# Patient Record
Sex: Male | Born: 1965
Health system: Southern US, Community
[De-identification: ages and names within clinical notes are randomized; demographics above are authoritative.]

## PROBLEM LIST (undated history)

## (undated) DIAGNOSIS — G459 Transient cerebral ischemic attack, unspecified: Secondary | ICD-10-CM

## (undated) DIAGNOSIS — W3400XA Accidental discharge from unspecified firearms or gun, initial encounter: Secondary | ICD-10-CM

## (undated) DIAGNOSIS — J449 Chronic obstructive pulmonary disease, unspecified: Secondary | ICD-10-CM

## (undated) DIAGNOSIS — K219 Gastro-esophageal reflux disease without esophagitis: Secondary | ICD-10-CM

## (undated) DIAGNOSIS — M24521 Contracture, right elbow: Secondary | ICD-10-CM

## (undated) DIAGNOSIS — I10 Essential (primary) hypertension: Secondary | ICD-10-CM

## (undated) DIAGNOSIS — M6281 Muscle weakness (generalized): Secondary | ICD-10-CM

## (undated) DIAGNOSIS — Z5900 Homelessness unspecified: Secondary | ICD-10-CM

## (undated) DIAGNOSIS — R262 Difficulty in walking, not elsewhere classified: Secondary | ICD-10-CM

## (undated) DIAGNOSIS — I890 Lymphedema, not elsewhere classified: Secondary | ICD-10-CM

## (undated) DIAGNOSIS — N529 Male erectile dysfunction, unspecified: Secondary | ICD-10-CM

## (undated) DIAGNOSIS — M7500 Adhesive capsulitis of unspecified shoulder: Secondary | ICD-10-CM

## (undated) DIAGNOSIS — N393 Stress incontinence (female) (male): Secondary | ICD-10-CM

## (undated) DIAGNOSIS — G47419 Narcolepsy without cataplexy: Secondary | ICD-10-CM

## (undated) DIAGNOSIS — R531 Weakness: Secondary | ICD-10-CM

## (undated) DIAGNOSIS — I959 Hypotension, unspecified: Secondary | ICD-10-CM

## (undated) DIAGNOSIS — M2450 Contracture, unspecified joint: Secondary | ICD-10-CM

## (undated) DIAGNOSIS — R296 Repeated falls: Secondary | ICD-10-CM

## (undated) DIAGNOSIS — R4182 Altered mental status, unspecified: Secondary | ICD-10-CM

## (undated) DIAGNOSIS — J4489 Other specified chronic obstructive pulmonary disease: Secondary | ICD-10-CM

## (undated) DIAGNOSIS — G8191 Hemiplegia, unspecified affecting right dominant side: Secondary | ICD-10-CM

## (undated) DIAGNOSIS — J189 Pneumonia, unspecified organism: Secondary | ICD-10-CM

## (undated) DIAGNOSIS — L210 Seborrhea capitis: Secondary | ICD-10-CM

## (undated) DIAGNOSIS — G479 Sleep disorder, unspecified: Secondary | ICD-10-CM

## (undated) DIAGNOSIS — E785 Hyperlipidemia, unspecified: Secondary | ICD-10-CM

## (undated) DIAGNOSIS — Z9114 Patient's other noncompliance with medication regimen: Secondary | ICD-10-CM

## (undated) HISTORY — DX: Transient cerebral ischemic attack, unspecified: G45.9

## (undated) HISTORY — DX: Hyperlipidemia, unspecified: E78.5

## (undated) HISTORY — DX: Chronic obstructive pulmonary disease, unspecified: J44.9

## (undated) HISTORY — DX: Muscle weakness (generalized): M62.81

## (undated) HISTORY — DX: Contracture, unspecified joint: M24.50

## (undated) HISTORY — DX: Stress incontinence (female) (male): N39.3

## (undated) HISTORY — DX: Hemiplegia, unspecified affecting right dominant side: G81.91

## (undated) HISTORY — DX: Weakness: R53.1

## (undated) HISTORY — DX: Difficulty in walking, not elsewhere classified: R26.2

## (undated) HISTORY — DX: Repeated falls: R29.6

## (undated) HISTORY — DX: Male erectile dysfunction, unspecified: N52.9

## (undated) HISTORY — DX: Other specified chronic obstructive pulmonary disease: J44.89

## (undated) HISTORY — DX: Gastro-esophageal reflux disease without esophagitis: K21.9

## (undated) HISTORY — DX: Contracture, right elbow: M24.521

## (undated) HISTORY — PX: LEG SURGERY: SHX1003

## (undated) HISTORY — DX: Narcolepsy without cataplexy: G47.419

## (undated) HISTORY — DX: Adhesive capsulitis of unspecified shoulder: M75.00

## (undated) HISTORY — DX: Sleep disorder, unspecified: G47.9

---

## 1997-11-30 ENCOUNTER — Emergency Department (HOSPITAL_COMMUNITY): Admission: EM | Admit: 1997-11-30 | Discharge: 1997-11-30 | Payer: Self-pay | Admitting: Emergency Medicine

## 1998-02-20 ENCOUNTER — Other Ambulatory Visit: Admission: RE | Admit: 1998-02-20 | Discharge: 1998-02-20 | Payer: Self-pay | Admitting: Gastroenterology

## 2001-05-14 ENCOUNTER — Inpatient Hospital Stay (HOSPITAL_COMMUNITY): Admission: EM | Admit: 2001-05-14 | Discharge: 2001-05-15 | Payer: Self-pay | Admitting: Emergency Medicine

## 2001-05-14 ENCOUNTER — Encounter: Payer: Self-pay | Admitting: Emergency Medicine

## 2001-07-11 DIAGNOSIS — J189 Pneumonia, unspecified organism: Secondary | ICD-10-CM

## 2001-07-11 HISTORY — DX: Pneumonia, unspecified organism: J18.9

## 2006-06-02 ENCOUNTER — Emergency Department (HOSPITAL_COMMUNITY): Admission: EM | Admit: 2006-06-02 | Discharge: 2006-06-02 | Payer: Self-pay | Admitting: Emergency Medicine

## 2006-07-08 ENCOUNTER — Ambulatory Visit: Payer: Self-pay | Admitting: Internal Medicine

## 2007-07-16 ENCOUNTER — Ambulatory Visit: Payer: Self-pay | Admitting: Internal Medicine

## 2007-07-16 DIAGNOSIS — I10 Essential (primary) hypertension: Secondary | ICD-10-CM

## 2007-07-16 LAB — CONVERTED CEMR LAB
Bilirubin Urine: NEGATIVE
Blood in Urine, dipstick: NEGATIVE
Glucose, Urine, Semiquant: NEGATIVE
Specific Gravity, Urine: >=1.03
pH: 5

## 2007-07-18 LAB — CONVERTED CEMR LAB
Chloride: 107 meq/L (ref 96–112)
Creatinine, Ser: 1.33 mg/dL (ref 0.40–1.50)
Potassium: 4.4 meq/L (ref 3.5–5.3)
Sodium: 143 meq/L (ref 135–145)

## 2007-07-20 ENCOUNTER — Encounter (INDEPENDENT_AMBULATORY_CARE_PROVIDER_SITE_OTHER): Payer: Self-pay | Admitting: *Deleted

## 2007-08-03 ENCOUNTER — Ambulatory Visit: Payer: Self-pay | Admitting: Internal Medicine

## 2007-10-21 ENCOUNTER — Emergency Department (HOSPITAL_COMMUNITY): Admission: EM | Admit: 2007-10-21 | Discharge: 2007-10-21 | Payer: Self-pay | Admitting: Emergency Medicine

## 2008-03-24 ENCOUNTER — Encounter (INDEPENDENT_AMBULATORY_CARE_PROVIDER_SITE_OTHER): Payer: Self-pay | Admitting: Internal Medicine

## 2008-10-27 ENCOUNTER — Ambulatory Visit: Payer: Self-pay | Admitting: Internal Medicine

## 2008-10-27 DIAGNOSIS — R609 Edema, unspecified: Secondary | ICD-10-CM

## 2008-10-27 LAB — CONVERTED CEMR LAB
BUN: 15 mg/dL (ref 6–23)
CO2: 25 meq/L (ref 19–32)
Chloride: 104 meq/L (ref 96–112)
Glucose, Bld: 94 mg/dL (ref 70–99)
Potassium: 5.3 meq/L (ref 3.5–5.3)

## 2008-11-03 ENCOUNTER — Ambulatory Visit: Payer: Self-pay | Admitting: *Deleted

## 2008-11-09 ENCOUNTER — Telehealth (INDEPENDENT_AMBULATORY_CARE_PROVIDER_SITE_OTHER): Payer: Self-pay | Admitting: Internal Medicine

## 2008-12-08 ENCOUNTER — Ambulatory Visit: Payer: Self-pay | Admitting: Internal Medicine

## 2008-12-08 DIAGNOSIS — K219 Gastro-esophageal reflux disease without esophagitis: Secondary | ICD-10-CM

## 2008-12-08 DIAGNOSIS — N529 Male erectile dysfunction, unspecified: Secondary | ICD-10-CM

## 2008-12-08 HISTORY — DX: Gastro-esophageal reflux disease without esophagitis: K21.9

## 2008-12-08 HISTORY — DX: Male erectile dysfunction, unspecified: N52.9

## 2008-12-23 ENCOUNTER — Ambulatory Visit: Payer: Self-pay | Admitting: Internal Medicine

## 2008-12-30 ENCOUNTER — Ambulatory Visit: Payer: Self-pay | Admitting: Internal Medicine

## 2009-01-05 ENCOUNTER — Emergency Department (HOSPITAL_COMMUNITY): Admission: EM | Admit: 2009-01-05 | Discharge: 2009-01-06 | Payer: Self-pay | Admitting: Emergency Medicine

## 2009-01-17 ENCOUNTER — Encounter (INDEPENDENT_AMBULATORY_CARE_PROVIDER_SITE_OTHER): Payer: Self-pay | Admitting: Internal Medicine

## 2009-01-17 DIAGNOSIS — E785 Hyperlipidemia, unspecified: Secondary | ICD-10-CM

## 2009-01-17 LAB — CONVERTED CEMR LAB
LDL Cholesterol: 110 mg/dL — ABNORMAL HIGH (ref 0–99)
Triglycerides: 215 mg/dL — ABNORMAL HIGH (ref <150)
VLDL: 43 mg/dL — ABNORMAL HIGH (ref 0–40)

## 2009-02-01 ENCOUNTER — Encounter (INDEPENDENT_AMBULATORY_CARE_PROVIDER_SITE_OTHER): Payer: Self-pay | Admitting: Internal Medicine

## 2009-02-16 ENCOUNTER — Ambulatory Visit: Payer: Self-pay | Admitting: Internal Medicine

## 2009-02-23 ENCOUNTER — Encounter (INDEPENDENT_AMBULATORY_CARE_PROVIDER_SITE_OTHER): Payer: Self-pay | Admitting: Internal Medicine

## 2009-02-24 ENCOUNTER — Ambulatory Visit: Payer: Self-pay | Admitting: Internal Medicine

## 2009-02-24 DIAGNOSIS — G479 Sleep disorder, unspecified: Secondary | ICD-10-CM | POA: Insufficient documentation

## 2009-02-24 HISTORY — DX: Sleep disorder, unspecified: G47.9

## 2009-02-24 LAB — CONVERTED CEMR LAB
ALT: 24 units/L (ref 0–53)
AST: 20 units/L (ref 0–37)
Albumin: 4.2 g/dL (ref 3.5–5.2)
Calcium: 9.4 mg/dL (ref 8.4–10.5)
Chloride: 103 meq/L (ref 96–112)
HCT: 42.8 % (ref 39.0–52.0)
Ketones, urine, test strip: NEGATIVE
Lymphocytes Relative: 29 % (ref 12–46)
Lymphs Abs: 2.2 10*3/uL (ref 0.7–4.0)
Neutrophils Relative %: 60 % (ref 43–77)
Nitrite: NEGATIVE
Platelets: 321 10*3/uL (ref 150–400)
Potassium: 4.3 meq/L (ref 3.5–5.3)
WBC: 7.5 10*3/uL (ref 4.0–10.5)
pH: 5

## 2009-02-25 ENCOUNTER — Ambulatory Visit (HOSPITAL_COMMUNITY): Admission: RE | Admit: 2009-02-25 | Discharge: 2009-02-25 | Payer: Self-pay | Admitting: Internal Medicine

## 2009-02-27 ENCOUNTER — Encounter (INDEPENDENT_AMBULATORY_CARE_PROVIDER_SITE_OTHER): Payer: Self-pay | Admitting: Internal Medicine

## 2009-03-10 ENCOUNTER — Telehealth (INDEPENDENT_AMBULATORY_CARE_PROVIDER_SITE_OTHER): Payer: Self-pay | Admitting: Internal Medicine

## 2009-04-14 ENCOUNTER — Encounter (INDEPENDENT_AMBULATORY_CARE_PROVIDER_SITE_OTHER): Payer: Self-pay | Admitting: Internal Medicine

## 2009-04-17 ENCOUNTER — Encounter (INDEPENDENT_AMBULATORY_CARE_PROVIDER_SITE_OTHER): Payer: Self-pay | Admitting: Internal Medicine

## 2009-04-24 ENCOUNTER — Encounter: Admission: RE | Admit: 2009-04-24 | Discharge: 2009-04-24 | Payer: Self-pay | Admitting: Internal Medicine

## 2009-05-09 ENCOUNTER — Encounter (INDEPENDENT_AMBULATORY_CARE_PROVIDER_SITE_OTHER): Payer: Self-pay | Admitting: Internal Medicine

## 2009-05-15 ENCOUNTER — Encounter (INDEPENDENT_AMBULATORY_CARE_PROVIDER_SITE_OTHER): Payer: Self-pay | Admitting: Internal Medicine

## 2009-05-15 ENCOUNTER — Ambulatory Visit (HOSPITAL_BASED_OUTPATIENT_CLINIC_OR_DEPARTMENT_OTHER): Admission: RE | Admit: 2009-05-15 | Discharge: 2009-05-15 | Payer: Self-pay | Admitting: Internal Medicine

## 2009-05-20 ENCOUNTER — Ambulatory Visit: Payer: Self-pay | Admitting: Internal Medicine

## 2009-06-08 ENCOUNTER — Ambulatory Visit: Payer: Self-pay | Admitting: Internal Medicine

## 2009-06-08 DIAGNOSIS — G4733 Obstructive sleep apnea (adult) (pediatric): Secondary | ICD-10-CM

## 2009-06-30 ENCOUNTER — Encounter (INDEPENDENT_AMBULATORY_CARE_PROVIDER_SITE_OTHER): Payer: Self-pay | Admitting: Internal Medicine

## 2009-07-19 ENCOUNTER — Telehealth (INDEPENDENT_AMBULATORY_CARE_PROVIDER_SITE_OTHER): Payer: Self-pay | Admitting: Internal Medicine

## 2009-07-22 ENCOUNTER — Encounter (INDEPENDENT_AMBULATORY_CARE_PROVIDER_SITE_OTHER): Payer: Self-pay | Admitting: Internal Medicine

## 2009-07-27 ENCOUNTER — Encounter (INDEPENDENT_AMBULATORY_CARE_PROVIDER_SITE_OTHER): Payer: Self-pay | Admitting: *Deleted

## 2009-08-20 ENCOUNTER — Telehealth (INDEPENDENT_AMBULATORY_CARE_PROVIDER_SITE_OTHER): Payer: Self-pay | Admitting: Internal Medicine

## 2009-09-20 ENCOUNTER — Telehealth (INDEPENDENT_AMBULATORY_CARE_PROVIDER_SITE_OTHER): Payer: Self-pay | Admitting: Internal Medicine

## 2009-09-22 ENCOUNTER — Telehealth (INDEPENDENT_AMBULATORY_CARE_PROVIDER_SITE_OTHER): Payer: Self-pay | Admitting: Internal Medicine

## 2009-11-22 ENCOUNTER — Encounter (INDEPENDENT_AMBULATORY_CARE_PROVIDER_SITE_OTHER): Payer: Self-pay | Admitting: *Deleted

## 2009-11-29 ENCOUNTER — Ambulatory Visit (HOSPITAL_BASED_OUTPATIENT_CLINIC_OR_DEPARTMENT_OTHER): Admission: RE | Admit: 2009-11-29 | Discharge: 2009-11-29 | Payer: Self-pay | Admitting: Internal Medicine

## 2009-11-29 ENCOUNTER — Encounter (INDEPENDENT_AMBULATORY_CARE_PROVIDER_SITE_OTHER): Payer: Self-pay | Admitting: Internal Medicine

## 2009-12-02 ENCOUNTER — Ambulatory Visit: Payer: Self-pay | Admitting: Internal Medicine

## 2009-12-25 ENCOUNTER — Telehealth (INDEPENDENT_AMBULATORY_CARE_PROVIDER_SITE_OTHER): Payer: Self-pay | Admitting: Internal Medicine

## 2010-06-12 NOTE — Letter (Signed)
Summary: [facility] [facility]  [place] [place] [facility]   Imported By: Eilleen Kempf 08/24/2009 15:10:33  _____________________________________________________________________  External Attachment:    Type:   Image     Comment:   External Document

## 2010-06-12 NOTE — Letter (Signed)
Summary: *HSN Results Follow up  [facility]  [address] [place], [place] [address]   Phone: [phone]  Fax: [phone]      07/27/2009   [name] [address] [place], [place]  [address]   Dear  [name],                            ____S.Drinkard,FNP   ____D. Gore,FNP       ____B. McPherson,MD   ____V. Rankins,MD    __x__E. Mulberry,MD    ____N. Daphine Deutscher, FNP  ____D. Reche Dixon, MD    ____K. Philipp Deputy, MD    ____Other     This letter is to inform you that your recent test(s):  _______Pap Smear    _______Lab Test     _______X-ray    _______ is within acceptable limits  _______ requires a medication change  _______ requires a follow-up lab visit  _______ requires a follow-up visit with your provider   Comments:  We have tried to reach you several times.  Please give our office a call regarding your letter for school.  Thank you.       _________________________________________________________ If you have any questions, please contact our office                     Sincerely,  Vesta Mixer CMA [facility]

## 2010-06-12 NOTE — Letter (Signed)
Summary: FAXED REQUESTED RECORDS TO [facility]  FAXED REQUESTED RECORDS TO [facility]   Imported By: Arta Bruce 06/02/2009 14:45:24  _____________________________________________________________________  External Attachment:    Type:   Image     Comment:   External Document

## 2010-06-12 NOTE — Progress Notes (Signed)
Summary: MEDS AND PROPERTY STOLLEN  Phone Note Call from Patient Call back at [phone]   Summary of Call: [name] PT. [name] CALLED AND SAYS THAT HE IS STAYING AT THE [facility], AND HIS BELONGINGS WERE TAKING INCLUDING ALL HIS MEDICATIONS, AND HE IS ASKING IF YOU CAN CALL EVERYTHING INTO [facility]. AND IF POSSIBLE IF HE CAN GET HIS MEDS BEFORE 4 DAYS. Initial call taken by: Leodis Rains,  Sep 22, 2009 8:53 AM  Follow-up for Phone Call        Spoke with Burna Mortimer at [facility], they will refill meds early for pt. Follow-up by: Vesta Mixer CMA,  Sep 25, 2009 11:04 AM  Additional Follow-up for Phone Call Additional follow up Details #1::        Left msg for pt at [facility] house. [phone] Additional Follow-up by: Vesta Mixer CMA,  Sep 25, 2009 11:06 AM

## 2010-06-12 NOTE — Progress Notes (Signed)
  Phone Note Outgoing Call   Summary of Call: Called pt. as appears he never was set up for CPAP titration--he states he lost phone service last month or so and that's why we could not get hold of him.  Letter that went out did not specify need to set up titration.  His address and phone numbers are current (currently).  Debra--could you please set him up for CPAP titration--he already had the positive sleep study in January, they just did not have enough time to do the titration.  Order in chart.  Thanks Initial call taken by: Julieanne Manson MD,  August 20, 2009 4:41 PM

## 2010-06-12 NOTE — Letter (Signed)
Summary: Generic Letter  [facility]  [address] [place], [place] [address]   Phone: [phone]  Fax: [phone]    07/22/2009  Re:  KENSINGTON DUERST      [address]      [place], [place]  [address]  To Whom It May Concern:  I am Mr. Santillo's primary health provider and attest that he does carry the diagnosis of obstructive sleep apnea.  We are currently working on getting him treatment for this disorder.  He may have difficulties staying awake during the day because of his condition until his treatment starts.          Sincerely,   Julieanne Manson MD

## 2010-06-12 NOTE — Assessment & Plan Note (Signed)
Summary: OFFICE VIISIT/GK   Vital Signs:  Patient profile:   45 year old male Weight:      246.9 pounds Temp:     98 degrees F oral Pulse rate:   99 / minute Pulse rhythm:   regular Resp:     20 per minute BP sitting:   160 / 103  (left arm) Cuff size:   regular  Vitals Entered By: Mikey College CMA (June 08, 2009 2:12 PM)  Serial Vital Signs/Assessments:  Time      Position  BP       Pulse  Resp  Temp     By 2:23 PM             120/82                         Mikey College CMA                     146/108                        Julieanne Manson MD  Comments: 2:23 PM MANUAL BP CHECK By: Mikey College CMA   CC: PT HERE BP CHECK AND STATES THAT MEDS THAT 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 PT DID REQUEST TO HAVE RECORDS SENT TO PROVIDER Is Patient Diabetic? No Pain Assessment Patient in pain? no       Does patient need assistance? 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 PT DID REQUEST TO HAVE RECORDS SENT TO PROVIDER.  History of Present Illness: 1.  OSA:  Moderate.  Unable to complete the CPAP titration as did not sleep long enough.  Recommendation to follow up with Sleep lab if no improvemnt with CPAP as his daytime somnolence problems seem more pronounced than what his OSA would suggest.  Is living with a friend currently.  2.  Hypertension:  Pt. states in jail during month of December, he was taking Lopressor 100 mg two times a day, baby aspirin and HCTZ.  BP was well controlled then and swelling was much better.  No problems with those meds.  Pt. increased his Toprol dosing to 200 mg daily on his own.  Pt. owed 175 dollars from cost of court for 2007.  3.  Peripheral edema:  Not too bad currently.  Once no longer homeless and sleeping upright--helped.  Restarted Furosemide once out of  jail.  Allergies: No Known Drug Allergies  Physical Exam  General:  Obese, NAD Lungs:  Normal respiratory effort, chest expands symmetrically. Lungs are clear to auscultation, no crackles or wheezes. Heart:  Recheck of BP was 146/108.Marland Kitchen RRR without abnormal sounds.  Radial pulses normal and equal Extremities:  trace edema.  Obese legs   Impression & Recommendations:  Problem # 1:  OBSTRUCTIVE SLEEP APNEA (ICD-327.23)  Orders: Sleep Disorder Referral (Sleep Disorder)--CPAP titration If daytime sleepiness does not improve with this, then will send back for further evaluation  Problem # 2:  HYPERTENSION (ICD-401.9) Increase Toprol to 300 mg total daily His updated medication list for this problem includes:    Toprol Xl 100 Mg Xr24h-tab (Metoprolol succinate) .Marland Kitchen... 1 tab by mouth in morning and 2 tabs at night    Hydrochlorothiazide 25 Mg Tabs (Hydrochlorothiazide) .Marland KitchenMarland KitchenMarland KitchenMarland Kitchen 1  tab by mouth every morning    Furosemide 20 Mg Tabs (Furosemide) .Marland Kitchen... 1 tab by mouth daily  Problem # 3:  PERIPHERAL EDEMA (ICD-782.3) Much better--stop Furosemide and KCl His updated medication list for this problem includes:    Hydrochlorothiazide 25 Mg Tabs (Hydrochlorothiazide) .Marland Kitchen... 1 tab by mouth every morning    Furosemide 20 Mg Tabs (Furosemide) .Marland Kitchen... 1 tab by mouth daily  Complete Medication List: 1)  Toprol Xl 100 Mg Xr24h-tab (Metoprolol succinate) .Marland Kitchen.. 1 tab by mouth in morning and 2 tabs at night 2)  Hydrochlorothiazide 25 Mg Tabs (Hydrochlorothiazide) .Marland Kitchen.. 1 tab by mouth every morning 3)  Famotidine 40 Mg Tabs (Famotidine) .Marland Kitchen.. 1 tab by mouth q hs 4)  Furosemide 20 Mg Tabs (Furosemide) .Marland Kitchen.. 1 tab by mouth daily 5)  Klor-con M10 10 Meq Cr-tabs (Potassium chloride crys cr) .Marland Kitchen.. 1 tab by mouth daily  Patient Instructions: 1)  Nurse visit for bp and pulse check in 2 weeks. 2)  Follow up with Dr. Delrae Alfred in 4months --OSA, htn Prescriptions: FAMOTIDINE 40 MG TABS (FAMOTIDINE) 1 tab by mouth q hs   #30 x 11   Entered and Authorized by:   Julieanne Manson MD   Signed by:   Julieanne Manson MD on 06/08/2009   Method used:   Faxed to ...       [facility] (retail)       [address] [place], [place]  [address]       Ph: [phone]       Fax: [phone]   RxID:   [id number] HYDROCHLOROTHIAZIDE 25 MG TABS (HYDROCHLOROTHIAZIDE) 1 tab by mouth every morning  #30 x 11   Entered and Authorized by:   Julieanne Manson MD   Signed by:   Julieanne Manson MD on 06/08/2009   Method used:   Faxed to ...       [facility] (retail)       [address] [place], [place]  [address]       Ph: [phone]       Fax: [phone]   RxID:   [id number] TOPROL XL 100 MG XR24H-TAB (METOPROLOL SUCCINATE) 1 tab by mouth in morning and 2 tabs at night  #90 x 11   Entered and Authorized by:   Julieanne Manson MD   Signed by:   Julieanne Manson MD on 06/08/2009   Method used:   Faxed to ...       [facility] (retail)       [address] [place], [place]  [address]       Ph: [phone]       Fax: [phone]   RxID:   [id number]

## 2010-06-12 NOTE — Progress Notes (Signed)
Summary: [facility] LETTER FOR SLEEP APNEIA  Phone Note Call from Patient Call back at Home Phone [phone]   Summary of Call: Divante Kotch PT. MR Droge CALLED AND SAYS THAT HE NEEDS A LETTER STATING THAT HE HAS SLEEP APNEIA. 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 Initial call taken by: Leodis Rains,  July 19, 2009 10:20 AM  Follow-up for Phone Call        Will check to see if she can do this. Follow-up by: Vesta Mixer CMA,  July 19, 2009 10:56 AM  Additional Follow-up for Phone Call Additional follow up Details #1::        written--when is he to have the CPAP titration?  Has it already been done? Additional Follow-up by: Julieanne Manson MD,  July 22, 2009 3:01 PM    Additional Follow-up for Phone Call Additional follow up Details #2::    Left message on answering machine for pt to return call at [phone]. Follow-up by: Vesta Mixer CMA,  July 24, 2009 11:50 AM  Additional Follow-up for Phone Call Additional follow up Details #3:: Details for Additional Follow-up Action Taken: Left message on answering machine for pt to return call at same number......... Elmarie Shiley McCoy CMA  July 26, 2009 9:57 AM   Left message on answering machine for pt to return call and letter mailed to pt since 3rd attempt to reach pt...Marland KitchenMarland KitchenMarland Kitchen Elmarie Shiley McCoy CMA  July 27, 2009 11:48 AM

## 2010-06-12 NOTE — Progress Notes (Signed)
Summary: Sleep study referral  Phone Note From Other Clinic   Caller: Referral Coordinator Summary of Call: Pt noshowed for sleep study.Pt so R/S Initial call taken by: Candi Leash,  Sep 20, 2009 11:56 AM

## 2010-06-12 NOTE — Letter (Signed)
Summary: MAILED REQUESTED RECORDS TO [facility]  MAILED REQUESTED RECORDS TO [facility]   Imported By: Arta Bruce 06/30/2009 15:21:42  _____________________________________________________________________  External Attachment:    Type:   Image     Comment:   External Document

## 2010-06-12 NOTE — Letter (Signed)
Summary: *HSN Results Follow up  [facility]  [address] [place], [place] [address]   Phone: [phone]  Fax: [phone]      11/22/2009   ORYON GARY [address] [place], [place]  [address]   Dear  Mr. Jaaziah Liebler,                            ____S.Drinkard,FNP   ____D. Gore,FNP       ____B. McPherson,MD   ____V. Rankins,MD    ____E. Mulberry,MD    ____N. Daphine Deutscher, FNP  ____D. Reche Dixon, MD    ____K. Philipp Deputy, MD    ____Other     This letter is to inform you that your recent test(s):  _______Pap Smear    _______Lab Test     _______X-ray    _______ is within acceptable limits  _______ requires a medication change  _______ requires a follow-up lab visit  _______ requires a follow-up visit with your provider   Comments: OUR OFFICE IS BEING TRYING TO CALL YOU ABOUT A SLEEP STUDY REFERRAL .PLEASE CONTACT [place] [phone] TO MAKE A NEW APPT AT [facility] TIME .THANK YOU AND HAVE A NICE DAY         _________________________________________________________ If you have any questions, please contact our office                     Sincerely,  Cheryll Dessert [facility]

## 2010-06-12 NOTE — Progress Notes (Signed)
Summary: [facility] referral for home CPAP set up  Phone Note From Other Clinic   Summary of Call: Sleep Clinic:  successful titration performed.  Did have periodic limb movement--to follow this and see if improves with CPAP usage or consider meds if not.    Arna Medici:  needs to be set up through [facility] respiratory to get CPAP--see orders. Initial call taken by: Julieanne Manson MD,  December 25, 2009 1:51 PM  Follow-up for Phone Call        I FAXED THE REFERRAL TO [facility] AND ALSO THE [facility] APPLICATION (MAILED TO ) [facility]  IS AN APPLICATION FOR PT THAT ARE SELF PAY BEFORE THEY DO THE PROCEDURE   THE PT NEED TO MAILED THE FORM  Follow-up by: Cheryll Dessert,  December 27, 2009 12:48 PM

## 2010-06-12 NOTE — Letter (Signed)
Summary: *HSN Results Follow up  [facility]  [address] [place], [place] [address]   Phone: [phone]  Fax: [phone]      11/22/2009   NATHANIE OTTLEY [address] [place], [place]  [address]   Dear  Mr. Brogan Sisler,                            ____S.Drinkard,FNP   ____D. Gore,FNP       ____B. McPherson,MD   ____V. Rankins,MD    _X___E. Mulberry,MD    ____N. Daphine Deutscher, FNP  ____D. Reche Dixon, MD    ____K. Philipp Deputy, MD    ____Other     This letter is to inform you that your recent test(s):  _______Pap Smear    _______Lab Test     _______X-ray    _______ is within acceptable limits  _______ requires a medication change  _______ requires a follow-up lab visit  _______ requires a follow-up visit with your provider   Comments:  OUR OFFICE IS BEING TRYING TO CALL YOU ABOUT A REFERRAL (SLEEP STUDY) .PLEASE, CONTACT [place] [phone] TO MAKE A NEW APPT AT YOUR CONVINIENCE TIME . THANK YOU AND HAVE A NICE DAY .       _________________________________________________________ If you have any questions, please contact our office                     Sincerely,  Cheryll Dessert [facility]

## 2010-06-14 NOTE — Letter (Signed)
Summary: [facility]  [facility]   Imported By: Arta Bruce 05/22/2010 10:22:11  _____________________________________________________________________  External Attachment:    Type:   Image     Comment:   External Document

## 2010-06-15 NOTE — Letter (Signed)
Summary: REQUESTING RECORDS FROM [facility]  REQUESTING RECORDS FROM [facility]   Imported By: Arta Bruce 07/10/2009 16:11:38  _____________________________________________________________________  External Attachment:    Type:   Image     Comment:   External Document

## 2010-08-18 LAB — DIFFERENTIAL
Basophils Absolute: 0.1 10*3/uL (ref 0.0–0.1)
Eosinophils Relative: 1 % (ref 0–5)
Lymphocytes Relative: 34 % (ref 12–46)
Lymphs Abs: 2.9 10*3/uL (ref 0.7–4.0)
Monocytes Absolute: 0.5 10*3/uL (ref 0.1–1.0)
Neutro Abs: 5 10*3/uL (ref 1.7–7.7)

## 2010-08-18 LAB — CBC
HCT: 39.7 % (ref 39.0–52.0)
Hemoglobin: 13.6 g/dL (ref 13.0–17.0)
RDW: 15 % (ref 11.5–15.5)
WBC: 8.6 10*3/uL (ref 4.0–10.5)

## 2010-08-18 LAB — RAPID URINE DRUG SCREEN, HOSP PERFORMED
Amphetamines: NOT DETECTED
Barbiturates: NOT DETECTED
Tetrahydrocannabinol: POSITIVE — AB

## 2010-08-18 LAB — URINALYSIS, ROUTINE W REFLEX MICROSCOPIC
Nitrite: NEGATIVE
Specific Gravity, Urine: 1.023 (ref 1.005–1.030)
Urobilinogen, UA: 0.2 mg/dL (ref 0.0–1.0)

## 2010-08-18 LAB — BASIC METABOLIC PANEL
CO2: 28 mEq/L (ref 19–32)
Calcium: 8.8 mg/dL (ref 8.4–10.5)
Chloride: 106 mEq/L (ref 96–112)
GFR calc non Af Amer: >60 mL/min (ref >60)

## 2010-08-18 LAB — ETHANOL: Alcohol, Ethyl (B): <5 mg/dL (ref 0–10)

## 2010-08-18 LAB — URINE MICROSCOPIC-ADD ON

## 2010-09-28 NOTE — Discharge Summary (Signed)
[facility]  Patient:    [name], BISCHOF Visit Number: [id number] MRN: [id number]          Service Type: MED Location: [phone] Attending Physician:  Anise Salvo Dictated by:   Gertha Calkin, M.D. Admit Date:  05/13/2001 Discharge Date: 05/15/2001   CC:         Outpatient Clinic   Discharge Summary  DATE OF BIRTH:  20-Oct-1965.  DISCHARGE DIAGNOSES: 1. Pneumonia, no organisms isolated. 2. Alkalosis. 3. Hypertension. 4. Hypopotassemia. 5. Hypokalemia. 6. Tobacco use.  DISCHARGE MEDICATIONS: Erythromycin 400 mg p.o. q.i.d. times 14 days.  DIAGNOSTIC TESTS AND PROCEDURES: 1. Chest x-ray done on 05/13/2001 shows no active disease. Lungs were clear.    Normal heart border, degenerative changes in the thoracic spine. 2. CT scan of chest to rule out pulmonary embolus was negative for pulmonary    embolus and no deep venous thromboses.  BRIEF HISTORY OF PRESENT ILLNESS:  The patient is a 45 year old black male with past medical history for hypertension, multiple incarcerations who presented to the emergency department complaining of left arm numbness off and on today.  He also reported a cough productive of yellow sputum for the greater part of a year.  This was not associated with any activity and denied weakness, however, his cough has been worsening with increased shortness of breath.  He has had flu like symptoms with fever and chills over the last two weeks.  His primary complaint was left hand numbness which was transient during his drive earlier in the day.  He denies chest pain, nausea, vomiting, dizziness.  The numbness in his hand was also spontaneous and lasted approximately 8 hours.  The patient is a smoker.  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  HABITS:  Patient has smoked one pack per day for 10 to 15 years.  He also drinks  alcohol with one to two shots plus a six pack of beer per week.  He has previously been a heavy drinker for approximately 7 years, about one-half gallon of hard liquor and one to two cases of beer per week.  He does acknowledge marijuana use, however, he denies any intravenous drug use.  Also states he has previously snorted cocaine approximately 5 years ago.  He was incarcerated back in 1998.  PHYSICAL EXAMINATION:  VITAL SIGNS:  Temperature 99.8, blood pressure 144/108, pulse rate 114, respirations 20 with oxygen saturations of 88% on room air.  GENERAL:  He is an African-American male in mild respiratory distress, able to carry on conversation with oxygen at 4 liters per nasal cannula and productive cough.  HEENT:  Normocephalic, atraumatic. Pupils are equal, round and reactive to light. Extraocular movements intact.  Moist mucous membranes, no erythema, no exudate, no lymphadenopathy, no sinus tenderness.  NECK:  No jugular venous distension, neck supple with no masses.  CHEST:  Respirations with inspiratory and expiratory wheezes, crackles present.  No dullness, no egophony.  CARDIOVASCULAR:  Tachycardic with regular rhythm.  No murmurs, rubs, or gallops.  ABDOMEN:  Soft, positive bowel sounds, nontender.  EXTREMITIES:  No clubbing, cyanosis, or edema.  MUSCULOSKELETAL:  Strength intact.  Full range of movement.  SKIN:  Multiple scars noted.  NEUROLOGICAL:  No gross deficits, motor and sensation all intact.  LABORATORY DATA:  ELECTROCARDIOGRAM:  On admission showed normal sinus rhythm with 87 beats per minute.  CHEST X-RAY:  As per above findings.  CT SCAN:  Ground glass appearance.  ARTERIAL BLOOD GASES:  PH 7.47, cO2 41, oxygen 77, 96% saturation on 4 liters nasal cannula.  CBC:  White blood cell count on admission was 14.8, hemoglobin 12.6, platelet count 348K, neutrophils 84%.  CMET:  Sodium 131, potassium 2.9, chloride 96, bicarb 29, BUN 5, creatinine 0.9,  glucose 108, anion gap 6.  Alkaline phosphatase 86, AST slightly elevated at 71, ALT slightly elevated at 55. Albumin was low at 2.9.  Total protein was 7.7, calcium 8.2.  HOSPITAL COURSE: #1 - PATIENT WITH LIKELY COMMUNITY ACQUIRED PNEUMONIA:  The patient was started on empiric antibiotics therapy.  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  The patient was continued on Rocephin, azithromycin as an inpatient.  Sputum cultures were negative however there were no proper sputum specimens sent to the laboratory.  The patient did, however, clinically improve requiring less oxygen and on discharge the patient had saturations greater than 94% with ambulation.  #2 -   ALKALOSIS:  Most likely secondary to infection with increased respiratory rate.  #3 - HYPERTENSION:  The patient was started on hydrochlorothiazide and the blood pressure was well controlled.  #4 - HYPOKALEMIA:  Most likely secondary to infection secondary to decreased p.o. input previous to hospital admission while patient was having flu-like symptoms.  This was repleted and the patient had discharge potassium level of 3.2.  Likely this will not be a problem once patient is in outpatient setting.  #5 - TOBACCO USE: Patient was given indications for tobacco cessation as well as alcohol cessation.   Prior to discharge the patient stated that he would attempt to stop smoking, but does not promise anything.  FOLLOW UP:  Patient is to see Dr. Gertha Calkin in outpatient clinic and the clinic has been notified to make an appointment for the patient in two to three weeks. Dictated by:   Gertha Calkin, M.D. Attending Physician:  Anise Salvo DD:  07/21/01 TD:  07/23/01 Job: [id number]

## 2011-02-07 LAB — CBC
HCT: 39.7
Hemoglobin: 13.6
MCHC: 34.3
RBC: 4.38
RDW: 14.2

## 2011-02-07 LAB — DIFFERENTIAL
Eosinophils Relative: 1
Lymphocytes Relative: 29
Monocytes Absolute: 0.6
Monocytes Relative: 7
Neutro Abs: 5.3

## 2011-02-07 LAB — URINE MICROSCOPIC-ADD ON

## 2011-02-07 LAB — URINALYSIS, ROUTINE W REFLEX MICROSCOPIC
Bilirubin Urine: NEGATIVE
Glucose, UA: NEGATIVE
Hgb urine dipstick: NEGATIVE
Ketones, ur: 15 — AB
Protein, ur: NEGATIVE
pH: 5.5

## 2011-02-07 LAB — POCT I-STAT, CHEM 8
BUN: 12
Calcium, Ion: 1.15
Chloride: 107
Glucose, Bld: 104 — ABNORMAL HIGH
TCO2: 25

## 2011-11-01 ENCOUNTER — Ambulatory Visit: Payer: Self-pay | Admitting: Family Medicine

## 2011-11-22 ENCOUNTER — Ambulatory Visit: Payer: Self-pay | Admitting: Family Medicine

## 2012-08-07 ENCOUNTER — Encounter (HOSPITAL_COMMUNITY): Payer: Self-pay | Admitting: Emergency Medicine

## 2012-08-07 ENCOUNTER — Observation Stay (HOSPITAL_COMMUNITY)
Admission: EM | Admit: 2012-08-07 | Discharge: 2012-08-08 | Disposition: A | Discharge status: Home / Self Care | Payer: No Typology Code available for payment source | Attending: Internal Medicine | Admitting: Internal Medicine

## 2012-08-07 ENCOUNTER — Emergency Department (HOSPITAL_COMMUNITY): Payer: No Typology Code available for payment source

## 2012-08-07 DIAGNOSIS — G4733 Obstructive sleep apnea (adult) (pediatric): Secondary | ICD-10-CM | POA: Insufficient documentation

## 2012-08-07 DIAGNOSIS — M79605 Pain in left leg: Secondary | ICD-10-CM

## 2012-08-07 DIAGNOSIS — G479 Sleep disorder, unspecified: Secondary | ICD-10-CM

## 2012-08-07 DIAGNOSIS — I890 Lymphedema, not elsewhere classified: Secondary | ICD-10-CM | POA: Insufficient documentation

## 2012-08-07 DIAGNOSIS — N529 Male erectile dysfunction, unspecified: Secondary | ICD-10-CM

## 2012-08-07 DIAGNOSIS — L03119 Cellulitis of unspecified part of limb: Secondary | ICD-10-CM | POA: Insufficient documentation

## 2012-08-07 DIAGNOSIS — M7989 Other specified soft tissue disorders: Secondary | ICD-10-CM | POA: Insufficient documentation

## 2012-08-07 DIAGNOSIS — R609 Edema, unspecified: Secondary | ICD-10-CM | POA: Insufficient documentation

## 2012-08-07 DIAGNOSIS — I10 Essential (primary) hypertension: Secondary | ICD-10-CM | POA: Diagnosis present

## 2012-08-07 DIAGNOSIS — L02419 Cutaneous abscess of limb, unspecified: Principal | ICD-10-CM | POA: Insufficient documentation

## 2012-08-07 DIAGNOSIS — E785 Hyperlipidemia, unspecified: Secondary | ICD-10-CM

## 2012-08-07 DIAGNOSIS — K219 Gastro-esophageal reflux disease without esophagitis: Secondary | ICD-10-CM

## 2012-08-07 DIAGNOSIS — L03116 Cellulitis of left lower limb: Secondary | ICD-10-CM

## 2012-08-07 DIAGNOSIS — L98499 Non-pressure chronic ulcer of skin of other sites with unspecified severity: Secondary | ICD-10-CM | POA: Insufficient documentation

## 2012-08-07 HISTORY — DX: Essential (primary) hypertension: I10

## 2012-08-07 HISTORY — DX: Accidental discharge from unspecified firearms or gun, initial encounter: W34.00XA

## 2012-08-07 LAB — CBC WITH DIFFERENTIAL/PLATELET
Basophils Relative: 1 % (ref 0–1)
Hemoglobin: 13.3 g/dL (ref 13.0–17.0)
Lymphocytes Relative: 26 % (ref 12–46)
MCHC: 34.1 g/dL (ref 30.0–36.0)
Monocytes Relative: 7 % (ref 3–12)
Neutro Abs: 5.7 10*3/uL (ref 1.7–7.7)
Neutrophils Relative %: 65 % (ref 43–77)
RBC: 4.59 MIL/uL (ref 4.22–5.81)
WBC: 8.8 10*3/uL (ref 4.0–10.5)

## 2012-08-07 LAB — COMPREHENSIVE METABOLIC PANEL
AST: 25 U/L (ref 0–37)
Albumin: 3.6 g/dL (ref 3.5–5.2)
Alkaline Phosphatase: 81 U/L (ref 39–117)
BUN: 15 mg/dL (ref 6–23)
CO2: 29 mEq/L (ref 19–32)
Chloride: 100 mEq/L (ref 96–112)
Potassium: 4.3 mEq/L (ref 3.5–5.1)
Total Bilirubin: 0.3 mg/dL (ref 0.3–1.2)

## 2012-08-07 MED ORDER — FUROSEMIDE 10 MG/ML IJ SOLN: 20.0000 mg | Freq: Once | INTRAMUSCULAR | Status: DC

## 2012-08-07 MED ORDER — ENOXAPARIN SODIUM 150 MG/ML ~~LOC~~ SOLN
1.0000 mg/kg | Freq: Once | SUBCUTANEOUS | Status: AC
  Administered 2012-08-07: 145 mg via SUBCUTANEOUS
  Filled 2012-08-07: qty 2

## 2012-08-07 MED ORDER — FUROSEMIDE 20 MG PO TABS
40.0000 mg | ORAL_TABLET | Freq: Once | ORAL | Status: AC
  Administered 2012-08-07: 40 mg via ORAL
  Filled 2012-08-07: qty 2
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
## 2012-08-07 NOTE — ED Notes (Signed)
Pt states legs have been increasing in swelling over the past 4 years; pt states pain in left leg to touch; pt c/o not being able to cut toenails due to difficutly reaching toenails as well as not being able to find something to cut them with; pt alert and mentating appropriately; pt denies fever/chills; pt denies n/v/d; pt denies dizziness and lightheadedness; pt denies headache; pt denies difficulty breathing

## 2012-08-08 ENCOUNTER — Encounter (HOSPITAL_COMMUNITY): Payer: Self-pay | Admitting: Internal Medicine

## 2012-08-08 DIAGNOSIS — L02419 Cutaneous abscess of limb, unspecified: Principal | ICD-10-CM

## 2012-08-08 DIAGNOSIS — I890 Lymphedema, not elsewhere classified: Secondary | ICD-10-CM

## 2012-08-08 DIAGNOSIS — L03116 Cellulitis of left lower limb: Secondary | ICD-10-CM | POA: Diagnosis present

## 2012-08-08 DIAGNOSIS — M79609 Pain in unspecified limb: Secondary | ICD-10-CM

## 2012-08-08 DIAGNOSIS — M7989 Other specified soft tissue disorders: Secondary | ICD-10-CM

## 2012-08-08 DIAGNOSIS — E785 Hyperlipidemia, unspecified: Secondary | ICD-10-CM

## 2012-08-08 DIAGNOSIS — R609 Edema, unspecified: Secondary | ICD-10-CM

## 2012-08-08 MED ORDER — FUROSEMIDE 40 MG PO TABS
40.0000 mg | ORAL_TABLET | Freq: Every day | ORAL | Status: DC
  Administered 2012-08-08: 40 mg via ORAL
  Filled 2012-08-08: qty 1

## 2012-08-08 MED ORDER — ONDANSETRON HCL 4 MG PO TABS: 4.0000 mg | ORAL_TABLET | Freq: Four times a day (QID) | ORAL | Status: DC | PRN

## 2012-08-08 MED ORDER — DOXYCYCLINE HYCLATE 100 MG PO TABS: 100.0000 mg | ORAL_TABLET | Freq: Two times a day (BID) | ORAL | Status: DC

## 2012-08-08 MED ORDER — HYDROCODONE-ACETAMINOPHEN 5-325 MG PO TABS: 1.0000 | ORAL_TABLET | Freq: Four times a day (QID) | ORAL | Status: DC | PRN

## 2012-08-08 MED ORDER — ONDANSETRON HCL 4 MG/2ML IJ SOLN: 4.0000 mg | Freq: Four times a day (QID) | INTRAMUSCULAR | Status: DC | PRN

## 2012-08-08 MED ORDER — METOPROLOL SUCCINATE ER 100 MG PO TB24: 100.0000 mg | ORAL_TABLET | Freq: Two times a day (BID) | ORAL | Status: DC

## 2012-08-08 MED ORDER — SODIUM CHLORIDE 0.9 % IJ SOLN: 3.0000 mL | Freq: Two times a day (BID) | INTRAMUSCULAR | Status: DC

## 2012-08-08 MED ORDER — METOPROLOL SUCCINATE ER 100 MG PO TB24
200.0000 mg | ORAL_TABLET | Freq: Every day | ORAL | Status: DC
  Filled 2012-08-08: qty 2

## 2012-08-08 MED ORDER — FUROSEMIDE 40 MG PO TABS: 40.0000 mg | ORAL_TABLET | Freq: Every day | ORAL | Status: DC

## 2012-08-08 MED ORDER — HYDROCODONE-ACETAMINOPHEN 5-325 MG PO TABS
1.0000 | ORAL_TABLET | ORAL | Status: DC | PRN
  Administered 2012-08-08 (×2): 2 via ORAL
  Filled 2012-08-08 (×2): qty 2

## 2012-08-08 MED ORDER — SODIUM CHLORIDE 0.9 % IJ SOLN
3.0000 mL | Freq: Two times a day (BID) | INTRAMUSCULAR | Status: DC
  Administered 2012-08-08: 3 mL via INTRAVENOUS

## 2012-08-08 MED ORDER — ACETAMINOPHEN 650 MG RE SUPP: 650.0000 mg | Freq: Four times a day (QID) | RECTAL | Status: DC | PRN

## 2012-08-08 MED ORDER — SODIUM CHLORIDE 0.9 % IV SOLN: 250.0000 mL | INTRAVENOUS | Status: DC | PRN

## 2012-08-08 MED ORDER — METOPROLOL SUCCINATE ER 100 MG PO TB24
100.0000 mg | ORAL_TABLET | Freq: Every day | ORAL | Status: DC
  Administered 2012-08-08: 100 mg via ORAL
  Filled 2012-08-08: qty 1

## 2012-08-08 MED ORDER — POTASSIUM CHLORIDE ER 10 MEQ PO TBCR
10.0000 meq | EXTENDED_RELEASE_TABLET | Freq: Every day | ORAL | Status: DC
  Administered 2012-08-08: 10 meq via ORAL
  Filled 2012-08-08: qty 1

## 2012-08-08 MED ORDER — ACETAMINOPHEN 325 MG PO TABS: 650.0000 mg | ORAL_TABLET | Freq: Four times a day (QID) | ORAL | Status: DC | PRN

## 2012-08-08 MED ORDER — ENOXAPARIN SODIUM 80 MG/0.8ML ~~LOC~~ SOLN
70.0000 mg | SUBCUTANEOUS | Status: DC
  Filled 2012-08-08: qty 0.8

## 2012-08-08 MED ORDER — ASPIRIN EC 81 MG PO TBEC
81.0000 mg | DELAYED_RELEASE_TABLET | Freq: Every day | ORAL | Status: DC
  Administered 2012-08-08: 81 mg via ORAL
  Filled 2012-08-08: qty 1

## 2012-08-08 MED ORDER — SODIUM CHLORIDE 0.9 % IJ SOLN: 3.0000 mL | INTRAMUSCULAR | Status: DC | PRN

## 2012-08-08 MED ORDER — DOCUSATE SODIUM 100 MG PO CAPS
100.0000 mg | ORAL_CAPSULE | Freq: Two times a day (BID) | ORAL | Status: DC
  Administered 2012-08-08: 100 mg via ORAL

## 2012-08-08 MED ORDER — DOXYCYCLINE HYCLATE 100 MG PO TABS
100.0000 mg | ORAL_TABLET | Freq: Two times a day (BID) | ORAL | Status: DC
  Administered 2012-08-08: 100 mg via ORAL
  Filled 2012-08-08 (×2): qty 1
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
## 2012-08-08 NOTE — Progress Notes (Signed)
UR completed 

## 2012-08-08 NOTE — Progress Notes (Signed)
Patient is off telemetry per order.

## 2012-08-08 NOTE — Progress Notes (Signed)
VASCULAR LAB PRELIMINARY  PRELIMINARY  PRELIMINARY  PRELIMINARY  Bilateral lower extremity venous Doppplers completed.    Preliminary report:  There is no obvious evidence of DVT or SVT noted in the bilateral lower extremity.  Charrisse Masley, RVT 08/08/2012, 11:30 AM
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
## 2012-08-08 NOTE — ED Provider Notes (Signed)
Medical screening examination/treatment/procedure(s) were performed by non-physician practitioner and as supervising physician I was immediately available for consultation/collaboration.   Dione Booze, MD 08/08/12 [phone]
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
## 2012-08-08 NOTE — Progress Notes (Signed)
Pt states he was homeless at this time with no resources. States he gets his medications from [facility] on [address] for free. Provided pt with contact number for [facility] to arrange follow up appt. States clinic was not able to arrange for him to see PCP. Explained he maybe able to get an appt post dc from hospital for follow up. States he will try. Prefers to go to [facility].  Provided pt with abx Rx from main pharmacy from [facility]. Isidoro Donning RN CCM Case Mgmt phone [phone]

## 2012-08-08 NOTE — Progress Notes (Signed)
Called [facility] per order for medication management.  Patient also stated "I have no shoes at all since my feet have swollen up" , forwarded to [facility] also. Will continue to monitor for status changes.

## 2012-08-08 NOTE — Progress Notes (Signed)
Orthopedic Tech Progress Note Patient Details:  [name] [birth year] [id number]  Ortho Devices Type of Ortho Device: Postop shoe/boot Ortho Device/Splint Location: bilaterall   [name] 08/08/2012, 1:29 PM

## 2013-05-16 ENCOUNTER — Encounter (HOSPITAL_COMMUNITY): Payer: Self-pay | Admitting: Emergency Medicine

## 2013-05-16 ENCOUNTER — Emergency Department (HOSPITAL_COMMUNITY): Payer: Self-pay

## 2013-05-16 ENCOUNTER — Emergency Department (HOSPITAL_COMMUNITY)
Admission: EM | Admit: 2013-05-16 | Discharge: 2013-05-16 | Disposition: A | Discharge status: Home / Self Care | Payer: Self-pay | Attending: Emergency Medicine | Admitting: Emergency Medicine

## 2013-05-16 DIAGNOSIS — Z792 Long term (current) use of antibiotics: Secondary | ICD-10-CM | POA: Insufficient documentation

## 2013-05-16 DIAGNOSIS — I10 Essential (primary) hypertension: Secondary | ICD-10-CM | POA: Insufficient documentation

## 2013-05-16 DIAGNOSIS — Z79899 Other long term (current) drug therapy: Secondary | ICD-10-CM | POA: Insufficient documentation

## 2013-05-16 DIAGNOSIS — R609 Edema, unspecified: Secondary | ICD-10-CM | POA: Insufficient documentation

## 2013-05-16 DIAGNOSIS — Z87828 Personal history of other (healed) physical injury and trauma: Secondary | ICD-10-CM | POA: Insufficient documentation

## 2013-05-16 DIAGNOSIS — F17200 Nicotine dependence, unspecified, uncomplicated: Secondary | ICD-10-CM | POA: Insufficient documentation

## 2013-05-16 DIAGNOSIS — R600 Localized edema: Secondary | ICD-10-CM

## 2013-05-16 DIAGNOSIS — R0602 Shortness of breath: Secondary | ICD-10-CM | POA: Insufficient documentation

## 2013-05-16 LAB — CBC WITH DIFFERENTIAL/PLATELET
BASOS ABS: 0.1 10*3/uL (ref 0.0–0.1)
BASOS PCT: 1 % (ref 0–1)
EOS PCT: 5 % (ref 0–5)
Eosinophils Absolute: 0.4 10*3/uL (ref 0.0–0.7)
HEMATOCRIT: 38.4 % — AB (ref 39.0–52.0)
Hemoglobin: 12.8 g/dL — ABNORMAL LOW (ref 13.0–17.0)
LYMPHS PCT: 21 % (ref 12–46)
Lymphs Abs: 1.5 10*3/uL (ref 0.7–4.0)
MCH: 29.8 pg (ref 26.0–34.0)
MCHC: 33.3 g/dL (ref 30.0–36.0)
MCV: 89.3 fL (ref 78.0–100.0)
MONO ABS: 0.7 10*3/uL (ref 0.1–1.0)
Monocytes Relative: 9 % (ref 3–12)
Neutro Abs: 4.9 10*3/uL (ref 1.7–7.7)
Neutrophils Relative %: 65 % (ref 43–77)
Platelets: 295 10*3/uL (ref 150–400)
RBC: 4.3 MIL/uL (ref 4.22–5.81)
RDW: 15.2 % (ref 11.5–15.5)
WBC: 7.5 10*3/uL (ref 4.0–10.5)

## 2013-05-16 LAB — BASIC METABOLIC PANEL
BUN: 8 mg/dL (ref 6–23)
CALCIUM: 8.5 mg/dL (ref 8.4–10.5)
CO2: 28 meq/L (ref 19–32)
CREATININE: 0.78 mg/dL (ref 0.50–1.35)
Chloride: 101 mEq/L (ref 96–112)
GFR calc Af Amer: >90 mL/min (ref >90)
GFR calc non Af Amer: >90 mL/min (ref >90)
Glucose, Bld: 102 mg/dL — ABNORMAL HIGH (ref 70–99)
Potassium: 4.5 mEq/L (ref 3.7–5.3)
Sodium: 140 mEq/L (ref 137–147)

## 2013-05-16 LAB — PRO B NATRIURETIC PEPTIDE: PRO B NATRI PEPTIDE: 55.2 pg/mL (ref 0–125)

## 2013-05-16 LAB — POCT I-STAT TROPONIN I: Troponin i, poc: 0 ng/mL (ref 0.00–0.08)

## 2013-05-16 MED ORDER — POTASSIUM CHLORIDE ER 10 MEQ PO TBCR: 10.0000 meq | EXTENDED_RELEASE_TABLET | Freq: Every day | ORAL | Status: DC

## 2013-05-16 MED ORDER — METOPROLOL SUCCINATE ER 100 MG PO TB24: 100.0000 mg | ORAL_TABLET | ORAL | Status: DC

## 2013-05-16 MED ORDER — FUROSEMIDE 40 MG PO TABS: 40.0000 mg | ORAL_TABLET | Freq: Every day | ORAL | Status: DC

## 2013-05-16 MED ORDER — FUROSEMIDE 10 MG/ML IJ SOLN
40.0000 mg | INTRAMUSCULAR | Status: AC
  Administered 2013-05-16: 40 mg via INTRAVENOUS
  Filled 2013-05-16: qty 4

## 2013-05-16 NOTE — ED Notes (Signed)
Per EMS pt has been out of his lasix and toprol for the past 3 weeks or more  Pt is c/o leg swelling and is feeling a little disoriented tonight  Pt is alert and oriented x 3  Pt is homeless

## 2013-05-16 NOTE — ED Provider Notes (Signed)
Medical screening examination/treatment/procedure(s) were performed by non-physician practitioner and as supervising physician I was immediately available for consultation/collaboration.  EKG Interpretation    Date/Time:  Sunday May 16 2013 01:22:21 EST Ventricular Rate:  88 PR Interval:  197 QRS Duration: 87 QT Interval:  391 QTC Calculation: 473 R Axis:   74 Text Interpretation:  Sinus rhythm Normal ECG Confirmed by Haylei Cobin  MD, Kenyen Candy ([id number]) on 05/16/2013 7:44:33 AM             [name], MD 05/16/13 [id number]

## 2013-05-16 NOTE — ED Notes (Signed)
PA at bedside.
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
## 2013-05-16 NOTE — Discharge Instructions (Signed)
Follow up with your doctor as soon as possible. Take your medication as directed. Refer to attached documents for more information. Return to the ED with worsening or concerning symptoms.

## 2013-05-16 NOTE — ED Notes (Signed)
EKG given to EDP, Otter,MD. For review. 

## 2013-09-17 ENCOUNTER — Encounter (HOSPITAL_COMMUNITY): Payer: Self-pay | Admitting: Emergency Medicine

## 2013-09-17 ENCOUNTER — Emergency Department (HOSPITAL_COMMUNITY)
Admission: EM | Admit: 2013-09-17 | Discharge: 2013-09-17 | Disposition: A | Discharge status: Home / Self Care | Payer: No Typology Code available for payment source | Attending: Emergency Medicine | Admitting: Emergency Medicine

## 2013-09-17 DIAGNOSIS — I10 Essential (primary) hypertension: Secondary | ICD-10-CM | POA: Insufficient documentation

## 2013-09-17 DIAGNOSIS — Z5900 Homelessness unspecified: Secondary | ICD-10-CM | POA: Insufficient documentation

## 2013-09-17 DIAGNOSIS — R609 Edema, unspecified: Secondary | ICD-10-CM | POA: Insufficient documentation

## 2013-09-17 DIAGNOSIS — S99929A Unspecified injury of unspecified foot, initial encounter: Secondary | ICD-10-CM

## 2013-09-17 DIAGNOSIS — S99919A Unspecified injury of unspecified ankle, initial encounter: Secondary | ICD-10-CM

## 2013-09-17 DIAGNOSIS — Y9389 Activity, other specified: Secondary | ICD-10-CM | POA: Insufficient documentation

## 2013-09-17 DIAGNOSIS — Z79899 Other long term (current) drug therapy: Secondary | ICD-10-CM | POA: Insufficient documentation

## 2013-09-17 DIAGNOSIS — W010XXA Fall on same level from slipping, tripping and stumbling without subsequent striking against object, initial encounter: Secondary | ICD-10-CM | POA: Insufficient documentation

## 2013-09-17 DIAGNOSIS — Z760 Encounter for issue of repeat prescription: Secondary | ICD-10-CM | POA: Insufficient documentation

## 2013-09-17 DIAGNOSIS — Y929 Unspecified place or not applicable: Secondary | ICD-10-CM | POA: Insufficient documentation

## 2013-09-17 DIAGNOSIS — S8990XA Unspecified injury of unspecified lower leg, initial encounter: Secondary | ICD-10-CM | POA: Insufficient documentation

## 2013-09-17 DIAGNOSIS — F17200 Nicotine dependence, unspecified, uncomplicated: Secondary | ICD-10-CM | POA: Insufficient documentation

## 2013-09-17 MED ORDER — SPIRONOLACTONE 25 MG PO TABS: 25.0000 mg | ORAL_TABLET | Freq: Two times a day (BID) | ORAL | Status: DC

## 2013-09-17 MED ORDER — AMLODIPINE BESYLATE 10 MG PO TABS: 10.0000 mg | ORAL_TABLET | Freq: Every day | ORAL | Status: DC

## 2013-09-17 MED ORDER — CARVEDILOL 12.5 MG PO TABS: 12.5000 mg | ORAL_TABLET | Freq: Two times a day (BID) | ORAL | Status: DC

## 2013-09-17 MED ORDER — FUROSEMIDE 20 MG PO TABS: 20.0000 mg | ORAL_TABLET | Freq: Two times a day (BID) | ORAL | Status: DC

## 2013-09-17 NOTE — Discharge Instructions (Signed)
°Emergency Department Resource Guide °1) Find a Doctor and Pay Out of Pocket °Although you won't have to find out who is covered by your insurance plan, it is a good idea to ask around and get recommendations. You will then need to call the office and see if the doctor you have chosen will accept you as a new patient and what types of options they offer for patients who are self-pay. Some doctors offer discounts or will set up payment plans for their patients who do not have insurance, but you will need to ask so you aren't surprised when you get to your appointment. ° °2) Contact Your Local Health Department °Not all health departments have doctors that can see patients for sick visits, but many do, so it is worth a call to see if yours does. If you don't know where your local health department is, you can check in your phone book. The CDC also has a tool to help you locate your state's health department, and many state websites also have listings of all of their local health departments. ° °3) Find a Walk-in Clinic °If your illness is not likely to be very severe or complicated, you may want to try a walk in clinic. These are popping up all over the country in pharmacies, drugstores, and shopping centers. They're usually staffed by nurse practitioners or physician assistants that have been trained to treat common illnesses and complaints. They're usually fairly quick and inexpensive. However, if you have serious medical issues or chronic medical problems, these are probably not your best option. ° °No Primary Care Doctor: °- Call [facility] at  832-8000 - they can help you locate a primary care doctor that  accepts your insurance, provides certain services, etc. °- Physician Referral Service- 1-800-533-3463 ° °Chronic Pain Problems: °Organization         Address  Phone   Notes  °[facility]  (336) 297-2271 Patients need to be referred by their primary care doctor.  ° °Medication  Assistance: °Organization         Address  Phone   Notes  °[place] [facility] [address] °[place], [place] [address] (336) 641-8030 --Must be a resident of [place] °-- Must have NO insurance coverage whatsoever (no Medicaid/ Medicare, etc.) °-- The pt. MUST have a primary care doctor that directs their care regularly and follows them in the community °  °[facility]  (866) 331-1348   °[employer]  (888) 892-1162   ° °Agencies that provide inexpensive medical care: °Organization         Address  Phone   Notes  °[facility]  (336) 832-8035   °[facility]    (336) 832-7272   °[facility] [address] °[place], [place] [address] (336) 832-4777   °[facility] [address], °[place] (336) 271-4999   °[facility]    (336) 373-0678   °[facility]    (336) 272-1050   °[facility] ° [address], [place] Phone:  (336) 832-4444, Fax:  (336) 832-4440 Hours of Operation:  9 am - 6 pm, M-F.  Also accepts Medicaid/Medicare and self-pay.  °[facility] ° [address],  Phone: (336) 832-3150, Fax: (336) 832-3151. Hours of Operation:  8:30 am - 5:30 pm, M-F.  Also accepts Medicaid and self-pay.  °[facility] [place] [address], [place] Phone: (336) 878-6027   °[facility] [address], [place], [place] (336)723-1848, Ext. 123 Mondays & Thursdays: 7-9 AM.  First 15 patients are seen on a first come, first serve basis. °  ° °Medicaid-accepting [place] Providers: ° °Organization         Address  Phone   Notes  °[facility] [address], [place] (336) 641-2100 Also accepts self-pay patients.  °[facility] [address], [place] ° (336) 856-9996   °[facility] [address], [place]  (336) 288-8857   °[facility] [address] [place] [address], [place] (336) 299-7000   °Veita Bland [address], [place]  ° (336) 373-1557 Only accepts [place] Access Medicaid patients after they have their name applied to their card.  ° °Self-Pay (no insurance) in [place]: ° °Organization         Address  Phone   Notes  °Sickle Cell Patients, [facility] [address], [place] (336) 832-1970   °[facility] [address], [place] (336) 832-4400   °[facility] ° [address], [place] (336) 992-4800   °[facility]/Dr. Osei-Bonsu ° [address] [place] [address], [place] or [address], [place] (336) 841-8500 Phone number for both [place] and [place] locations is the same.  °[facility] [address], [place] (336) 299-0000   °[facility] [address] [place] [address], [place] or [address] (336) 852-7530 °(336) 878-2260   °[facility] [address],  (336) 350-1642, phone; (336) 294-5005, fax Sees patients 1st and 3rd Saturday of every month.  Must not qualify for public or private insurance (i.e. Medicaid, Medicare, [place] Health Choice, Veterans' Benefits) • Household income should be no more than 200% of the poverty level •The clinic cannot treat you if you are pregnant or think you are pregnant • Sexually transmitted diseases are not treated at the clinic.  ° ° °Dental Care: °Organization         Address  Phone  Notes  °[place] [facility] [address],  (336) 641-6152 Accepts children up to age 21 who are enrolled in Medicaid or [place] Health Choice; pregnant women with a Medicaid card; and children who have applied for Medicaid or [place] Health Choice, but were declined, whose parents can pay a reduced fee at time of service.  °[place]  [facility] [place]  [address], [place] (336) 641-7733 Accepts children up to age 21 who are enrolled in Medicaid or [place] Health Choice; pregnant women with a Medicaid card; and children who have applied for Medicaid or  Health Choice, but were declined, whose parents can pay a reduced fee at time of service.  °[facility] ° [address],  (336) 641-4533 Patients are seen by appointment only. Walk-ins are not accepted. [facility] will see patients 18 years of age and older. °Monday - Tuesday (8am-5pm) °Most Wednesdays (8:30-5pm) °$30 per visit, cash only  °[facility] ° [address], [place] (336) 641-4533 Patients are seen by appointment only. Walk-ins are not accepted. [facility] will see patients 18 years of age and older. °One   Wednesday Evening (Monthly: Volunteer Based).  $30 per visit, cash only  °[facility]  (919) 537-3737 for adults; Children under age 4, call Graduate Pediatric Dentistry at (919) [phone]. Children aged 4-14, please call (919) [phone] to request a pediatric application. ° Dental services are provided in all areas of dental care including fillings, crowns and bridges, complete and partial dentures, implants, gum treatment, root canals, and extractions. Preventive care is also provided. Treatment is provided to both adults and children. °Patients are selected via a lottery and there is often a waiting list. °  °[facility] [address], °[place] ° (336) 763-8833 [email or web address] °  °[facility] [address], [place], [place] (336)723-1848, Ext. 123 Second and Fourth Thursday of each month, opens at 6:30 AM; Clinic ends at 9 AM.  Patients are seen on a first-come first-served basis, and a limited number are seen during each clinic.  ° °[facility] ° [address], [place], [place] (336) 723-7904    Eligibility Requirements °You must have lived in [place], [place], or [place] counties for at least the last three months. °  You cannot be eligible for state or federal sponsored healthcare insurance, including Veterans Administration, Medicaid, or Medicare. °  You generally cannot be eligible for healthcare insurance through your employer.  °  How to apply: °Eligibility screenings are held every Tuesday and Wednesday afternoon from 1:00 pm until 4:00 pm. You do not need an appointment for the interview!  °[facility] [address], [place], [place] 336-631-2330   °[facility] [facility]  336-342-8273   °[facility]  336-703-3100   °[facility]  336-570-6415   ° °Behavioral Health Resources in the Community: °Intensive Outpatient Programs °Organization         Address  Phone  Notes  °[facility] [address], [place], [place] 336-878-6098   °[facility] [address], [place], [place] 336-832-9800   °[facility] [address], [place], [place] ° 336-882-2125   °[facility] [address],  °[place], [place] 1-800-853-5163 or 336-641-4981   °Substance Abuse Resources °Organization         Address  Phone  Notes  °[facility]  336-882-2125   °[facility]  336-784-9470   °[facility]  336-285-9073   °[facility]  336-845-3988   °Residential & Outpatient Substance Abuse Program  1-800-659-3381   °Psychological Services °Organization         Address  Phone  Notes  °[facility]  336- 832-9600   °[facility]  336- 378-7881   °[facility] [address], [place] 1-800-853-5163 or 336-641-4981   ° °Mobile Crisis Teams °Organization         Address  Phone  Notes  °[facility], Mobile Crisis Care Unit  1-877-626-1772   °Assertive °[facility] ° [address]  [place], [place] 336-834-9664   °Sharon DeEsch [address] °[place] 336-554-5454   ° °Self-Help/Support Groups °Organization         Address  Phone             Notes  °[facility] [place] - variety of support groups  336- 373-1402 Call for more information  °Narcotics Anonymous (NA), [facility] [address], °[place]   2 meetings at this location  ° °  Residential Treatment Programs °Organization         Address  Phone  Notes  °[facility] [address],    °[place]  1-866-801-8205   °[facility] ° [address], [place], [place] 704-293-8524   °[facility] [address], [place] 336-845-3988 Admissions: 8am-3pm M-F  °[facility] [address]    °[place], [place] 336-841-1104   °[facility] [address], [place], [place] 336-379-7146   °[facility] [address]  °[place], [place] 336-285-9073   °[facility] - Intensive Outpatient [address], [place], [place] 336-852-3033   °[facility] [address]  °[place], [place] 1-877-615-2722 or 336-784-9470   °[facility] [address] [place], [place] 336-227-7417 Accepts Medicaid  °[facility] [address]  °[place] 1-800-659-3381 Substance Abuse/Addiction Treatment  ° °[place] Behavioral Health Resources °Organization         Address  Phone  Notes  °[facility]  (888) 581-9988   °Julie Brannon, PhD [address] [place], [place]   (336) 349-5553 or (336) 951-0000   °[facility]   [address] °[place], [place] (336) 349-4454   °[facility] [address], [place], [place] (336) 342-8316 Insurance/Medicaid/sponsorship through [facility]  °[facility] [address]                                    [place], [place] (336) 342-8316 Therapy/tele-psych/case    °[facility] [address]  ° [place], [place] (336) 349-2233    °Dr. Arfeen  (336) 349-4544   °[facility] [place]  [employer] [place] [facility] 1) [address], [place] °2) [address], [place] °3)  [address], [place] (336) 349-3220 °(336) 342-7768 ° °(336) 342-8140   °[place] [facility] (336) 342-1394 or (336) 342-3537 (After Hours)    ° ° °
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
## 2013-09-17 NOTE — ED Notes (Signed)
Per EMS pt c/o dizziness causing him to fall x 3 today.  Pt states he is here to obtain a new cane to help him ambulate.   EMS also mentions +4 edema to b/l LE.

## 2013-09-17 NOTE — Progress Notes (Signed)
  CARE MANAGEMENT ED NOTE 09/17/2013  Patient:  [name],[name]   Account Number:  [id number]  Date Initiated:  09/17/2013  Documentation initiated by:  Radford PaxFERRERO,Derra Shartzer  Subjective/Objective Assessment:   Patient presents to ED with dizziness     Subjective/Objective Assessment Detail:     Action/Plan:   Action/Plan Detail:   Anticipated DC Date:  09/17/2013     Status Recommendation to Physician:   Result of Recommendation:    Other ED Services  Consult Working Plan    DC Planning Services  Other  PCP issues  MATCH Program  Medication Assistance    Choice offered to / List presented to:            Status of service:  Completed, signed off  ED Comments:   ED Comments Detail:  EDCM spoke to patient at bedside.  Patient reports he is homeless and has no money.  Patient requesting assistance with medications and a new cane.  Patient reports he has the orange card but [facility]  Patient has just gotten out of prison. EDP at bedside.  Patient [facility]able to have [facility].  Patient was a patient of [facility] and was seen by Dr. Julieanne MansonElizabeth Mulberry.  ED[facility] initiated [facility] for [facility] assistance.  Instructed patient he has seven days to get his prescriptions filled otherwise the [facility] is void.  EDCM explained to patient that this is a one time assist and will not be able to receive this service again until 09/18/2014.  EDCM instructed patient to take his prescriptions with his MATCH letter to any participating [facility] pharmacy (list provided). Patient verbalized understanding.  EDCM provided patient with list of financial resources in the community such as local churches and [employer], urban ministries. Address and [facility] provided to patient. Informed patient that [facility] are [facility] Mon-Th[facility] from 9am -1030am.  Patient provided [facility] with phone number where he can best be  reached.  Patient thankful for resources.  No further EDCM needs at this time.

## 2013-09-17 NOTE — ED Notes (Signed)
Pt states that he got dizzy d/t standing up to quickly causing him to fall.  Pt does not c/o pain from fall.

## 2013-09-17 NOTE — Progress Notes (Signed)
[name] was requested by the [facility] to assist the patient with homeless resources.  [name] met with the patient at bedside. Patient accepted the resources but declined the assistance of the [name] to get him in a shelter tonight.  He reported that he will follow up when needed.  No further follow up is needed at this time.     Chesley Noon, MSW, [place], 09/17/2013 Evening Clinical Social Worker [phone]

## 2013-09-17 NOTE — Progress Notes (Signed)
09/17/2013 A. Jatia Musa RNCM 2232pm [facility].  When to speaking to patient earlier, patient's friend at bedside reports he will help patient purchase a cane.  No further EDCM needs at this time.

## 2013-09-17 NOTE — ED Notes (Signed)
Bed: [id number] Expected date:  Expected time:  Means of arrival:  Comments: EMS 10M fall, swollen legs

## 2013-09-20 ENCOUNTER — Encounter (HOSPITAL_COMMUNITY): Payer: Self-pay | Admitting: Emergency Medicine

## 2013-09-20 ENCOUNTER — Emergency Department (HOSPITAL_COMMUNITY)
Admission: EM | Admit: 2013-09-20 | Discharge: 2013-09-20 | Disposition: A | Discharge status: Home / Self Care | Payer: No Typology Code available for payment source | Attending: Emergency Medicine | Admitting: Emergency Medicine

## 2013-09-20 DIAGNOSIS — M7989 Other specified soft tissue disorders: Secondary | ICD-10-CM | POA: Insufficient documentation

## 2013-09-20 DIAGNOSIS — R000 Tachycardia, unspecified: Secondary | ICD-10-CM | POA: Insufficient documentation

## 2013-09-20 DIAGNOSIS — F17200 Nicotine dependence, unspecified, uncomplicated: Secondary | ICD-10-CM | POA: Insufficient documentation

## 2013-09-20 DIAGNOSIS — R42 Dizziness and giddiness: Secondary | ICD-10-CM | POA: Insufficient documentation

## 2013-09-20 DIAGNOSIS — E860 Dehydration: Secondary | ICD-10-CM | POA: Insufficient documentation

## 2013-09-20 DIAGNOSIS — Z79899 Other long term (current) drug therapy: Secondary | ICD-10-CM | POA: Insufficient documentation

## 2013-09-20 DIAGNOSIS — R5383 Other fatigue: Secondary | ICD-10-CM

## 2013-09-20 DIAGNOSIS — Z87828 Personal history of other (healed) physical injury and trauma: Secondary | ICD-10-CM | POA: Insufficient documentation

## 2013-09-20 DIAGNOSIS — I10 Essential (primary) hypertension: Secondary | ICD-10-CM | POA: Insufficient documentation

## 2013-09-20 DIAGNOSIS — I890 Lymphedema, not elsewhere classified: Secondary | ICD-10-CM | POA: Insufficient documentation

## 2013-09-20 DIAGNOSIS — R5381 Other malaise: Secondary | ICD-10-CM | POA: Insufficient documentation

## 2013-09-20 LAB — BASIC METABOLIC PANEL
BUN: 13 mg/dL (ref 6–23)
CO2: 26 mEq/L (ref 19–32)
Calcium: 8.5 mg/dL (ref 8.4–10.5)
Chloride: 103 mEq/L (ref 96–112)
Creatinine, Ser: 0.87 mg/dL (ref 0.50–1.35)
GFR calc Af Amer: >90 mL/min (ref >90)
Glucose, Bld: 105 mg/dL — ABNORMAL HIGH (ref 70–99)
POTASSIUM: 3.9 meq/L (ref 3.7–5.3)
Sodium: 142 mEq/L (ref 137–147)

## 2013-09-20 LAB — CBC
HEMATOCRIT: 35.3 % — AB (ref 39.0–52.0)
Hemoglobin: 11.8 g/dL — ABNORMAL LOW (ref 13.0–17.0)
MCH: 28.9 pg (ref 26.0–34.0)
MCHC: 33.4 g/dL (ref 30.0–36.0)
MCV: 86.3 fL (ref 78.0–100.0)
PLATELETS: 283 10*3/uL (ref 150–400)
RBC: 4.09 MIL/uL — ABNORMAL LOW (ref 4.22–5.81)
RDW: 14.6 % (ref 11.5–15.5)
WBC: 8.1 10*3/uL (ref 4.0–10.5)

## 2013-09-20 LAB — CBG MONITORING, ED: GLUCOSE-CAPILLARY: 110 mg/dL — AB (ref 70–99)

## 2013-09-20 MED ORDER — SODIUM CHLORIDE 0.9 % IV BOLUS (SEPSIS)
1000.0000 mL | Freq: Once | INTRAVENOUS | Status: AC
  Administered 2013-09-20: 1000 mL via INTRAVENOUS
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
## 2013-09-20 NOTE — ED Notes (Signed)
Checked patient cbg it was 110 notifed RN of blood sugar

## 2013-09-20 NOTE — ED Notes (Signed)
Pt. given 7.5 oz . Sprite .
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
## 2013-09-20 NOTE — Discharge Instructions (Signed)
If you were given medicines take as directed.  If you are on coumadin or contraceptives realize their levels and effectiveness is altered by many different medicines.  If you have any reaction (rash, tongues swelling, other) to the medicines stop taking and see a physician.   Please follow up as directed and return to the ER or see a physician for new or worsening symptoms.  Thank you. Filed Vitals:   09/20/13 1802 09/20/13 1852 09/20/13 1945 09/20/13 2042  BP: 140/68 136/62 128/74 132/86  Pulse: 108 87 88 86  Temp: 97.7 F (36.5 C)     TempSrc: Oral     Resp: 20 18 22 14   SpO2: 96% 98% 96% 99%

## 2013-09-21 ENCOUNTER — Encounter (HOSPITAL_COMMUNITY): Payer: Self-pay | Admitting: Emergency Medicine

## 2013-09-21 ENCOUNTER — Emergency Department (HOSPITAL_COMMUNITY)
Admission: EM | Admit: 2013-09-21 | Discharge: 2013-09-21 | Disposition: A | Discharge status: Home / Self Care | Payer: No Typology Code available for payment source | Attending: Emergency Medicine | Admitting: Emergency Medicine

## 2013-09-21 DIAGNOSIS — I10 Essential (primary) hypertension: Secondary | ICD-10-CM | POA: Insufficient documentation

## 2013-09-21 DIAGNOSIS — I890 Lymphedema, not elsewhere classified: Secondary | ICD-10-CM | POA: Insufficient documentation

## 2013-09-21 DIAGNOSIS — Z5900 Homelessness unspecified: Secondary | ICD-10-CM | POA: Insufficient documentation

## 2013-09-21 DIAGNOSIS — Z87828 Personal history of other (healed) physical injury and trauma: Secondary | ICD-10-CM | POA: Insufficient documentation

## 2013-09-21 DIAGNOSIS — R5383 Other fatigue: Secondary | ICD-10-CM

## 2013-09-21 DIAGNOSIS — Z79899 Other long term (current) drug therapy: Secondary | ICD-10-CM | POA: Insufficient documentation

## 2013-09-21 DIAGNOSIS — R5381 Other malaise: Secondary | ICD-10-CM | POA: Insufficient documentation

## 2013-09-21 DIAGNOSIS — F17200 Nicotine dependence, unspecified, uncomplicated: Secondary | ICD-10-CM | POA: Insufficient documentation
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
## 2013-09-21 NOTE — Discharge Instructions (Signed)
Lymphedema Lymphedema is a swelling caused by the abnormal collection of lymph under the skin. The lymph is fluid from the tissues in your body that travels in the lymphatic system. This system is part of the immune system that includes lymph nodes and vessels. The lymph vessels collect and carry the excess fluid, fats, proteins, and wastes from the tissues of the body to the bloodstream. This system also works to clean and remove bacteria and waste products from the body.  Lymphedema occurs when the lymphatic system is blocked. When the lymph vessels or lymph nodes are blocked or damaged, lymph does not drain properly. This causes abnormal build up of lymph. This leads to swelling in the arms or legs. Lymphedema cannot be cured by medicines. But the swelling can be reduced by physical methods. CAUSES  There are two types of lymphedema. Primary lymphedema is caused by the absence or abnormality of the lymph vessel at birth. It is also known as inherited lymphedema, which occurs rarely. Secondary or acquired lymphedema occurs when the lymph vessel is damaged or blocked. The causes of lymph vessel blockage are:   Skin infection like cellulites.  Infection by parasites (filariasis).  Injury.  Cancer.  Radiation therapy.  Formation of scar tissue.  Surgery. SYMPTOMS  The symptoms of lymphedema are:  Abnormal swelling of the arm or leg.  Heavy or tight feeling in your arm or leg.  Tight-fitting shoes or rings.  Redness of skin over the affected area.  Limited movement of the affected limb.  Some patients complain about sensitivity to touch and discomfort in the limb(s) affected. You may not have these symptoms immediately following injury. They usually appear within a few days or even years after injury. Inform your caregiver, if you have any of these symptoms. Early treatment can avoid further problems.  DIAGNOSIS  First, your caregiver will inquire about any surgery you have had or  medicines you are taking. He will then examine you. Your caregiver may order special imaging tests, such as:  Lymphoscintigraphy (a test in which a low dose of radioactive substance is injected to trace the flow of lymph through the lymph vessels).  MRI (imaging tests using magnetic fields).  Computed tomography (test using special cross-sectional X-rays).  Duplex ultrasound (test using high-frequency sound waves to show the vessels and the blood flow on a screen).  Lymphangiography (special X-ray taken after injecting a contrast dye into the lymph vessel). It is now rarely done. TREATMENT  Lymphedema can be treated in different ways. Your caregiver will decide the type of treatment depending on the cause. Treatment may include:  Exercise: Special exercises will help fluid move out easily from the affected part. This should be done as per your caregiver's advice.  Manual lymph drainage: Gentle massage of the affected limb makes the fluid to move out more freely.  Compression: Compression stockings or external pump apply pressure over the affected limb. This helps the fluid to move out from the arm or leg. Bandaging can also help to move the fluid out from the affected part. Your caregiver will decide the method that suits you the best.  Medicines: Your caregiver may prescribe antibiotics, if you have infection.  Surgery: Your caregiver may advise surgery for severe lymphedema. It is reserved for special cases when the patient has difficulty moving. Your surgeon may remove excess tissue from the arm or leg. This will help to ease your movement. Physical therapy may have to be continued after surgery. HOME CARE INSTRUCTIONS    The area is very fragile and is predisposed to injury and infection.  Eat a healthy diet.  Exercise regularly as per advice.  Keep the affected area clean and dry.  Use gloves while cooking or gardening.  Protect your skin from cuts.  Use electric razor to  shave the affected area.  Keep affected limb elevated.  Do not wear tight clothes, shoes, or jewelry as it may cause the tissue to be strangled.  Do not use heat pads over the affected area.  Do not sit with cross legs.  Do not walk barefoot.  Do not carry weight on the affected arm.  Avoid having blood pressure checked on the affected limb. SEEK MEDICAL CARE IF:  You continue to have swelling in your limb. SEEK IMMEDIATE MEDICAL CARE IF:   You have high fever.  You have skin rash.  You have chills or sweats.  You have pain or redness.  You have a cut that does not heal. MAKE SURE YOU:   Understand these instructions.  Will watch your condition.  Will get help right away if you are not doing well or get worse. Document Released: 02/24/2007 Document Revised: 04/15/2012 Document Reviewed: 01/30/2009 Exit[employer] Patient Information 2014 [employer].  

## 2013-09-21 NOTE — Progress Notes (Signed)
  CARE MANAGEMENT ED NOTE 09/21/2013  Patient:  [name],[name]   Account Number:  [id number]  Date Initiated:  09/21/2013  Documentation initiated by:  Radford PaxFERRERO,Xavion Muscat  Subjective/Objective Assessment:   Patient presents to Ed with dizziness de to inner ear problem.     Subjective/Objective Assessment Detail:     Action/Plan:   Action/Plan Detail:   Anticipated DC Date:  09/21/2013     Status Recommendation to Physician:   Result of Recommendation:    Other ED Services  Consult Working Plan    DC Planning Services  Other    Choice offered to / List presented to:            Status of service:  Completed, signed off  ED Comments:   ED Comments Detail:  EDCM spoke to patient at bedside.  Patient reports he has not made an attempt to [facility] and no one has called the patient regarding an appointment. Patient then went on to say that [facility] who took the patient's prescriptions and is "ordering them."  Patient reports he filled out the application for the orange card [facility]. Patient reports he has lost the the resources [facility] during his last ED visit on 05/08.  [facility] but did not get them filled there.  Patient reports he still has the [facility].  Patient confirms he will be [facility] from now on.  [facility] informed patient that when he gets his orange card, the name of his pcp will be on that card.  EDCm instructed patient to follow [facility].  Patient verbalized understanding.  Patient thankful for services.  No further EDCM needs at this time.

## 2013-09-21 NOTE — ED Provider Notes (Signed)
Medical screening examination/treatment/procedure(s) were performed by non-physician practitioner and as supervising physician I was immediately available for consultation/collaboration.  Khadeeja Elden T Adian Jablonowski, MD 09/21/13 2340 

## 2013-09-21 NOTE — ED Notes (Signed)
Per EMS: [place].  C/o dizziness d/t underlying inner ear problem.  [facility].  Has been going on for several months.  Has been out of htn meds x 1 wk.

## 2013-09-28 ENCOUNTER — Emergency Department (HOSPITAL_COMMUNITY)
Admission: EM | Admit: 2013-09-28 | Discharge: 2013-09-28 | Disposition: A | Discharge status: Home / Self Care | Payer: No Typology Code available for payment source | Attending: Emergency Medicine | Admitting: Emergency Medicine

## 2013-09-28 ENCOUNTER — Encounter (HOSPITAL_COMMUNITY): Payer: Self-pay | Admitting: Emergency Medicine

## 2013-09-28 ENCOUNTER — Emergency Department (HOSPITAL_COMMUNITY): Payer: No Typology Code available for payment source

## 2013-09-28 DIAGNOSIS — I10 Essential (primary) hypertension: Secondary | ICD-10-CM | POA: Insufficient documentation

## 2013-09-28 DIAGNOSIS — S0990XA Unspecified injury of head, initial encounter: Secondary | ICD-10-CM

## 2013-09-28 DIAGNOSIS — Y9289 Other specified places as the place of occurrence of the external cause: Secondary | ICD-10-CM | POA: Insufficient documentation

## 2013-09-28 DIAGNOSIS — F17200 Nicotine dependence, unspecified, uncomplicated: Secondary | ICD-10-CM | POA: Insufficient documentation

## 2013-09-28 DIAGNOSIS — W050XXA Fall from non-moving wheelchair, initial encounter: Secondary | ICD-10-CM | POA: Insufficient documentation

## 2013-09-28 DIAGNOSIS — S1093XA Contusion of unspecified part of neck, initial encounter: Principal | ICD-10-CM

## 2013-09-28 DIAGNOSIS — M542 Cervicalgia: Secondary | ICD-10-CM

## 2013-09-28 DIAGNOSIS — S0993XA Unspecified injury of face, initial encounter: Secondary | ICD-10-CM | POA: Insufficient documentation

## 2013-09-28 DIAGNOSIS — S0003XA Contusion of scalp, initial encounter: Secondary | ICD-10-CM | POA: Insufficient documentation

## 2013-09-28 DIAGNOSIS — Z79899 Other long term (current) drug therapy: Secondary | ICD-10-CM | POA: Insufficient documentation

## 2013-09-28 DIAGNOSIS — S199XXA Unspecified injury of neck, initial encounter: Secondary | ICD-10-CM

## 2013-09-28 DIAGNOSIS — S0083XA Contusion of other part of head, initial encounter: Principal | ICD-10-CM | POA: Insufficient documentation

## 2013-09-28 DIAGNOSIS — Y939 Activity, unspecified: Secondary | ICD-10-CM | POA: Insufficient documentation

## 2013-09-28 MED ORDER — ACETAMINOPHEN 325 MG PO TABS
650.0000 mg | ORAL_TABLET | Freq: Once | ORAL | Status: AC
  Administered 2013-09-28: 650 mg via ORAL
  Filled 2013-09-28: qty 2

## 2013-09-28 NOTE — ED Notes (Signed)
Bed: [id number] Expected date:  Expected time:  Means of arrival:  Comments: EMS- fall out of wheelchair, head and neck pain
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
## 2013-09-28 NOTE — Discharge Instructions (Signed)
Take tylenol as need. Follow up with primary care doctor in 1 week if symptoms fail to improve/resolve. Your ct scan was read as showing no acute traumatic injury - note was made of degenerative changes in the lower neck.   Return to ER if worse, new symptoms, severe pain, vomiting, change in mental status, other concern.    Facial or Scalp Contusion A facial or scalp contusion is a deep bruise on the face or head. Injuries to the face and head generally cause a lot of swelling, especially around the eyes. Contusions are the result of an injury that caused bleeding under the skin. The contusion may turn blue, purple, or yellow. Minor injuries will give you a painless contusion, but more severe contusions may stay painful and swollen for a few weeks.  CAUSES  A facial or scalp contusion is caused by a blunt injury or trauma to the face or head area.  SIGNS AND SYMPTOMS   Swelling of the injured area.   Discoloration of the injured area.   Tenderness, soreness, or pain in the injured area.  DIAGNOSIS  The diagnosis can be made by taking a medical history and doing a physical exam. An X-ray exam, CT scan, or MRI may be needed to determine if there are any associated injuries, such as broken bones (fractures). TREATMENT  Often, the best treatment for a facial or scalp contusion is applying cold compresses to the injured area. Over-the-counter medicines may also be recommended for pain control.  HOME CARE INSTRUCTIONS   Only take over-the-counter or prescription medicines as directed by your health care provider.   Apply ice to the injured area.   Put ice in a plastic bag.   Place a towel between your skin and the bag.   Leave the ice on for 20 minutes, 2 3 times a day.  SEEK MEDICAL CARE IF:  You have bite problems.   You have pain with chewing.   You are concerned about facial defects. SEEK IMMEDIATE MEDICAL CARE IF:  You have severe pain or a headache that is not  relieved by medicine.   You have unusual sleepiness, confusion, or personality changes.   You throw up (vomit).   You have a persistent nosebleed.   You have double vision or blurred vision.   You have fluid drainage from your nose or ear.   You have difficulty walking or using your arms or legs.  MAKE SURE YOU:   Understand these instructions.  Will watch your condition.  Will get help right away if you are not doing well or get worse. Document Released: 06/06/2004 Document Revised: 02/17/2013 Document Reviewed: 12/10/2012 [facility] Patient Information 2014 [place], [place].    Head Injury, Adult You have received a head injury. It does not appear serious at this time. Headaches and vomiting are common following head injury. It should be easy to awaken from sleeping. Sometimes it is necessary for you to stay in the emergency department for a while for observation. Sometimes admission to the hospital may be needed. After injuries such as yours, most problems occur within the first 24 hours, but side effects may occur up to 7 10 days after the injury. It is important for you to carefully monitor your condition and contact your health care provider or seek immediate medical care if there is a change in your condition. WHAT ARE THE TYPES OF HEAD INJURIES? Head injuries can be as minor as a bump. Some head injuries can be more severe.  More severe head injuries include:  A jarring injury to the brain (concussion).  A bruise of the brain (contusion). This mean there is bleeding in the brain that can cause swelling.  A cracked skull (skull fracture).  Bleeding in the brain that collects, clots, and forms a bump (hematoma). WHAT CAUSES A HEAD INJURY? A serious head injury is most likely to happen to someone who is in a car wreck and is not wearing a seat belt. Other causes of major head injuries include bicycle or motorcycle accidents, sports injuries, and falls. HOW ARE HEAD  INJURIES DIAGNOSED? A complete history of the event leading to the injury and your current symptoms will be helpful in diagnosing head injuries. Many times, pictures of the brain, such as CT or MRI are needed to see the extent of the injury. Often, an overnight hospital stay is necessary for observation.  WHEN SHOULD I SEEK IMMEDIATE MEDICAL CARE?  You should get help right away if:  You have confusion or drowsiness.  You feel sick to your stomach (nauseous) or have continued, forceful vomiting.  You have dizziness or unsteadiness that is getting worse.  You have severe, continued headaches not relieved by medicine. Only take over-the-counter or prescription medicines for pain, fever, or discomfort as directed by your health care provider.  You do not have normal function of the arms or legs or are unable to walk.  You notice changes in the black spots in the center of the colored part of your eye (pupil).  You have a clear or bloody fluid coming from your nose or ears.  You have a loss of vision. During the next 24 hours after the injury, you must stay with someone who can watch you for the warning signs. This person should contact local emergency services (911 in the [place] if you have seizures, you become unconscious, or you are unable to wake up. HOW CAN I PREVENT A HEAD INJURY IN THE FUTURE? The most important factor for preventing major head injuries is avoiding motor vehicle accidents. To minimize the potential for damage to your head, it is crucial to wear seat belts while riding in motor vehicles. Wearing helmets while bike riding and playing collision sports (like football) is also helpful. Also, avoiding dangerous activities around the house will further help reduce your risk of head injury.  WHEN CAN I RETURN TO NORMAL ACTIVITIES AND ATHLETICS? You should be reevaluated by your health care provider before returning to these activities. If you have any of the following symptoms,  you should not return to activities or contact sports until 1 week after the symptoms have stopped:  Persistent headache.  Dizziness or vertigo.  Poor attention and concentration.  Confusion.  Memory problems.  Nausea or vomiting.  Fatigue or tire easily.  Irritability.  Intolerant of bright lights or loud noises.  Anxiety or depression.  Disturbed sleep. MAKE SURE YOU:   Understand these instructions.  Will watch your condition.  Will get help right away if you are not doing well or get worse. Document Released: 04/29/2005 Document Revised: 02/17/2013 Document Reviewed: 01/04/2013 [facility]Exit[facility] Patient Information 2014 [place]ExitCare, [place]LL[place].

## 2013-10-24 ENCOUNTER — Encounter (HOSPITAL_COMMUNITY): Payer: Self-pay | Admitting: Emergency Medicine

## 2013-10-24 ENCOUNTER — Emergency Department (HOSPITAL_COMMUNITY)
Admission: EM | Admit: 2013-10-24 | Discharge: 2013-10-24 | Disposition: A | Discharge status: Home / Self Care | Payer: No Typology Code available for payment source | Attending: Emergency Medicine | Admitting: Emergency Medicine

## 2013-10-24 DIAGNOSIS — X30XXXA Exposure to excessive natural heat, initial encounter: Secondary | ICD-10-CM | POA: Insufficient documentation

## 2013-10-24 DIAGNOSIS — I10 Essential (primary) hypertension: Secondary | ICD-10-CM | POA: Insufficient documentation

## 2013-10-24 DIAGNOSIS — Z87828 Personal history of other (healed) physical injury and trauma: Secondary | ICD-10-CM | POA: Insufficient documentation

## 2013-10-24 DIAGNOSIS — T679XXA Effect of heat and light, unspecified, initial encounter: Secondary | ICD-10-CM

## 2013-10-24 DIAGNOSIS — M549 Dorsalgia, unspecified: Secondary | ICD-10-CM | POA: Insufficient documentation

## 2013-10-24 DIAGNOSIS — Z79899 Other long term (current) drug therapy: Secondary | ICD-10-CM | POA: Insufficient documentation

## 2013-10-24 DIAGNOSIS — F17200 Nicotine dependence, unspecified, uncomplicated: Secondary | ICD-10-CM | POA: Insufficient documentation

## 2013-10-24 DIAGNOSIS — I890 Lymphedema, not elsewhere classified: Secondary | ICD-10-CM

## 2013-10-24 DIAGNOSIS — Y9389 Activity, other specified: Secondary | ICD-10-CM | POA: Insufficient documentation

## 2013-10-24 DIAGNOSIS — Y929 Unspecified place or not applicable: Secondary | ICD-10-CM | POA: Insufficient documentation

## 2013-10-24 DIAGNOSIS — T677XXA Heat edema, initial encounter: Secondary | ICD-10-CM | POA: Insufficient documentation

## 2013-10-24 NOTE — ED Notes (Signed)
Per EMS pt reports walking and states his back began hurting so [place] [employer] Bystander called EMS. Pt has hx of bilateral edema to lower extremities. Pt is alert and oriented, walks with assistance.

## 2013-10-24 NOTE — ED Provider Notes (Signed)
Medical screening examination/treatment/procedure(s) were performed by non-physician practitioner and as supervising physician I was immediately available for consultation/collaboration.   EKG Interpretation None        Lyanne CoKevin M Maraya Gwilliam, MD 10/24/13 1512

## 2013-10-24 NOTE — ED Notes (Addendum)
Pt states he more so having leg weakness due to the edema in his legs bilaterally. He states his back only hurts slightly. Pt states because it is so hot out side this may be the cause of the weakness in his legs. Pt states the edema in his legs is not anything new.
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
## 2013-10-24 NOTE — Discharge Instructions (Signed)
Heat Disorders  Heat related disorders are illnesses caused by continued exposure to hot and humid environments, not drinking enough fluids, and/or your body failing to regulate its temperature correctly. People suffer from heat stress and heat related disorders when their bodies are unable to compensate and cool down through sweating. With sufficient heat, sweating is not enough to keep you cool, and your body temperature can rise quickly. Very high body temperatures can damage your brain and other vital organs. High humidity (moisture in the air), adds to heat stress, because it is harder for sweat to evaporate and cool your body. Heat stress and disorders are not uncommon. Some medicines can increase your risk for heat related illness. Ask your caregiver about your medicines during periods of intense heat.   Heat related disorders include:   Heatstroke. When you cannot sweat or regulate your body temperature in an adequate way. This is very dangerous and can be life threatening. Get emergency medical help.   Heat exhaustion. Overheating causes heavy sweating and a fast heart rate. Your body can still regulate its own temperature.   Heat cramps. Painful, uncontrollable muscle spasms. Can occur during heavy exercise in hot environments.   Sunburn. Skin becomes red and painful (burned) after being out in the sun.   Heat rash. Sweat ducts become blocked, which traps sweat under the skin. This causes blisters and red bumps and may cause an itchy or tingling feeling.  PREVENTING HEAT STRESS AND HEAT RELATED DISORDERS  Overheating can be dangerous and life threatening. When exercising, working, or doing other activities in hot and humid environments, do the following:   Stay informed by listening to and watching local broadcast weather and safety updates during intense heat.   Air conditioning is the best way to prevent heat disorders. If your home is not air conditioned, spend time in air conditioned places  (malls, public libraries, or heat shelters set up by your local health department).   Wear light-weight, light colored, loose fitting clothing. Wear as little clothing as possible when at home.   Increase your fluid intake. Drink enough water and fluids to keep your urine clear or pale yellow. DO NOT WAIT UNTIL YOU ARE THIRSTY TO DRINK. You may already be heat stressed, and not recognize it.   If your caregiver has suggested that you limit the amount of fluid you drink or has prescribed water pills for a medical problem, ask how much you should drink when the weather is hot.   Do not drink liquids with alcohol, caffeine, or lots of sugar. They can cause more loss of body fluid.   Heavy sweating drains your body's salt and minerals, which must be replaced. If you must exercise in the heat, a sports beverage can replace the salt and minerals you lose in sweat. If you are on a low-salt diet, check with your caregiver before drinking a sports beverage.   Sunburn reduces your body's ability to cool itself and causes a loss of needed body fluids. If you go outdoors, protect yourself from the sun by wearing a wide-brimmed hat, along with sunglasses.   Put on sunscreen of SPF 15 or higher, 30 minutes before going out. (The most effective products say "broad spectrum" or "UVA/UVB protection" on the label.) Reapply sunscreen frequently -- at least every 1-2 hours.   Take added precautions when both the heat and humidity are high.   Rest often.   Even young and otherwise healthy people can become heat stressed and suffer   out only during morning and evening hours, when it is cooler. Rest often in shady areas, so that your body's temperature can adjust.  If your heart pounds or you are gasping for breath, STOP all activity. Go immediately to a cool area, or at least into the shade, and rest.  This is especially true if you become lightheaded, confused, weak, or faint.  Electric fans may make you comfortable, but they DO NOT prevent heat related problems. SYMPTOMS   Headache.  Nosebleed.  Weakness.  You feel very hot.  Muscle cramps.  Restlessness.  Fainting or dizziness.  Fast breathing and shortness of breath.  Excessive sweating. (There may be little or no sweating in late stages of heat exhaustion.)  Rapid pulse, heart pounding.  Feeling sick to your stomach (nauseous, vomiting).  Skin becoming cold and clammy, or excessively hot and dry. HOME CARE INSTRUCTIONS   Lie down and rest in a cool or air conditioned area.  Drink enough water and fluids to keep your urine clear or pale yellow. Avoid fluids with caffeine or high sugar content. Avoid coffee, tea, alcohol or stimulants.  Do not take salt tablets, unless advised by your caregiver.  Avoid hot foods and heavy meals.  Bathe or shower in cool water.  Wear minimal clothing.  Use a fan. Add cool or warm mist to the air, if possible.  If possible, decrease the use of your stove or oven at home.  Monitor adults at risk at least twice a day, watching closely for signs of heat exhaustion or heat stroke. Infants and young children also require more frequent watching.  Never leave infants, children or pets in a parked car, even if the windows are cracked open.  If you are 48 years of age or older, have a friend or relative call to check on you twice a day during a heat wave. If you know someone in this age group, check on them at least twice a day. SEEK IMMEDIATE MEDICAL CARE IF:  You have a hard time breathing.  You vomit or pass blood in your stool.  You have a seizure, feel dizzy or faint, or pass out.  You develop severe sweating.  Your skin is red, hot and dry (there is no sweating).  Your urine turns a dark color or has blood in it.  You are making very little or no urine.  You are  unable to keep fluids down.  You develop chest or abdominal pain.  You develop a throbbing headache.  You develop nausea or confusion. IF YOU OBSERVE SOMEONE WHO MIGHT HAVE HEAT STROKE This can be life threatening. Call your local emergency services (911 in the [place]  If the victim is in the sun, get him or her to a shady area.  Cool the victim rapidly, using whatever methods you have:  Place the victim in a tub of cool water or a cool shower.  Spray the victim with cool water from a garden hose, or sponge the person with cool water.  Wrap the victim in a cool, wet sheet and fan them.  If emergency medical help is delayed, call the hospital emergency room or your local emergency services (911 in the [place] for further instructions.  Sometimes a victim's muscles will begin to twitch from heat stroke. If this happens, keep the victim from injuring himself. However, do not place any object in the mouth. Give fluids, unless the muscle twitching makes it difficult or unsafe to do so. If there  is vomiting, make sure the airway remains open by turning the victim on his or her side. Document Released: 04/26/2000 Document Revised: 07/22/2011 Document Reviewed: 02/13/2009 [facility] Patient Information 2014 [place], [place].  Edema Edema is an abnormal build-up of fluids in tissues. Because this is partly dependent on gravity (water flows to the lowest place), it is more common in the legs and thighs (lower extremities). It is also common in the looser tissues, like around the eyes. Painless swelling of the feet and ankles is common and increases as a person ages. It may affect both legs and may include the calves or even thighs. When squeezed, the fluid may move out of the affected area and may leave a dent for a few moments. CAUSES   Prolonged standing or sitting in one place for extended periods of time. Movement helps pump tissue fluid into the veins, and absence of movement prevents this,  resulting in edema.  Varicose veins. The valves in the veins do not work as well as they should. This causes fluid to leak into the tissues.  Fluid and salt overload.  Injury, burn, or surgery to the leg, ankle, or foot, may damage veins and allow fluid to leak out.  Sunburn damages vessels. Leaky vessels allow fluid to go out into the sunburned tissues.  Allergies (from insect bites or stings, medications or chemicals) cause swelling by allowing vessels to become leaky.  Protein in the blood helps keep fluid in your vessels. Low protein, as in malnutrition, allows fluid to leak out.  Hormonal changes, including pregnancy and menstruation, cause fluid retention. This fluid may leak out of vessels and cause edema.  Medications that cause fluid retention. Examples are sex hormones, blood pressure medications, steroid treatment, or anti-depressants.  Some illnesses cause edema, especially heart failure, kidney disease, or liver disease.  Surgery that cuts veins or lymph nodes, such as surgery done for the heart or for breast cancer, may result in edema. DIAGNOSIS  Your caregiver is usually easily able to determine what is causing your swelling (edema) by simply asking what is wrong (getting a history) and examining you (doing a physical). Sometimes x-rays, EKG (electrocardiogram or heart tracing), and blood work may be done to evaluate for underlying medical illness. TREATMENT  General treatment includes:  Leg elevation (or elevation of the affected body part).  Restriction of fluid intake.  Prevention of fluid overload.  Compression of the affected body part. Compression with elastic bandages or support stockings squeezes the tissues, preventing fluid from entering and forcing it back into the blood vessels.  Diuretics (also called water pills or fluid pills) pull fluid out of your body in the form of increased urination. These are effective in reducing the swelling, but can have side  effects and must be used only under your caregiver's supervision. Diuretics are appropriate only for some types of edema. The specific treatment can be directed at any underlying causes discovered. Heart, liver, or kidney disease should be treated appropriately. HOME CARE INSTRUCTIONS   Elevate the legs (or affected body part) above the level of the heart, while lying down.  Avoid sitting or standing still for prolonged periods of time.  Avoid putting anything directly under the knees when lying down, and do not wear constricting clothing or garters on the upper legs.  Exercising the legs causes the fluid to work back into the veins and lymphatic channels. This may help the swelling go down.  The pressure applied by elastic bandages or support stockings  can help reduce ankle swelling.  A low-salt diet may help reduce fluid retention and decrease the ankle swelling.  Take any medications exactly as prescribed. SEEK MEDICAL CARE IF:  Your edema is not responding to recommended treatments. SEEK IMMEDIATE MEDICAL CARE IF:   You develop shortness of breath or chest pain.  You cannot breathe when you lay down; or if, while lying down, you have to get up and go to the window to get your breath.  You are having increasing swelling without relief from treatment.  You develop a fever over 102 F (38.9 C).  You develop pain or redness in the areas that are swollen.  Tell your caregiver right away if you have gained 03 lb/1.4 kg in 1 day or 05 lb/2.3 kg in a week. MAKE SURE YOU:   Understand these instructions.  Will watch your condition.  Will get help right away if you are not doing well or get worse. Document Released: 04/29/2005 Document Revised: 10/29/2011 Document Reviewed: 12/16/2007 [facility] Patient Information 2014 [place], [place].

## 2013-11-14 ENCOUNTER — Encounter (HOSPITAL_COMMUNITY): Payer: Self-pay | Admitting: Emergency Medicine

## 2013-11-14 ENCOUNTER — Emergency Department (HOSPITAL_COMMUNITY)
Admission: EM | Admit: 2013-11-14 | Discharge: 2013-11-14 | Discharge status: Against Medical Advice | Payer: No Typology Code available for payment source | Attending: Emergency Medicine | Admitting: Emergency Medicine

## 2013-11-14 DIAGNOSIS — Z5900 Homelessness unspecified: Secondary | ICD-10-CM | POA: Insufficient documentation

## 2013-11-14 DIAGNOSIS — R55 Syncope and collapse: Secondary | ICD-10-CM | POA: Insufficient documentation

## 2013-11-14 DIAGNOSIS — I10 Essential (primary) hypertension: Secondary | ICD-10-CM | POA: Insufficient documentation

## 2013-11-14 DIAGNOSIS — R609 Edema, unspecified: Secondary | ICD-10-CM | POA: Insufficient documentation

## 2013-11-14 DIAGNOSIS — F17200 Nicotine dependence, unspecified, uncomplicated: Secondary | ICD-10-CM | POA: Insufficient documentation

## 2013-11-14 NOTE — ED Notes (Addendum)
Per ems pt was outside of [facility], pt reported he got dizzy and let himself down to the ground. Diaphoretic upon ems arrival. Pt reports episodes like this happen occasionally when he gets overheated, reports lethargy is normal when he gets like this. Pt reports the edema in legs in normal. Reports he takes his fluid and BP pill like he should. Pt denies pain. Denies chest pain.  20 g L AC.
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
## 2013-11-14 NOTE — ED Notes (Signed)
Bed: WA08 Expected date:  Expected time:  Means of arrival:  Comments: EMS/near-syncope 

## 2013-12-13 ENCOUNTER — Encounter (HOSPITAL_COMMUNITY): Payer: Self-pay | Admitting: Emergency Medicine

## 2013-12-13 ENCOUNTER — Emergency Department (HOSPITAL_COMMUNITY)
Admission: EM | Admit: 2013-12-13 | Discharge: 2013-12-13 | Discharge status: Against Medical Advice | Payer: No Typology Code available for payment source | Attending: Emergency Medicine | Admitting: Emergency Medicine

## 2013-12-13 DIAGNOSIS — Y939 Activity, unspecified: Secondary | ICD-10-CM | POA: Insufficient documentation

## 2013-12-13 DIAGNOSIS — X30XXXA Exposure to excessive natural heat, initial encounter: Secondary | ICD-10-CM | POA: Insufficient documentation

## 2013-12-13 DIAGNOSIS — Y929 Unspecified place or not applicable: Secondary | ICD-10-CM | POA: Insufficient documentation

## 2013-12-13 DIAGNOSIS — R42 Dizziness and giddiness: Secondary | ICD-10-CM | POA: Insufficient documentation

## 2013-12-13 DIAGNOSIS — F17200 Nicotine dependence, unspecified, uncomplicated: Secondary | ICD-10-CM | POA: Insufficient documentation

## 2013-12-13 DIAGNOSIS — I10 Essential (primary) hypertension: Secondary | ICD-10-CM | POA: Insufficient documentation

## 2013-12-13 NOTE — ED Notes (Signed)
Pt. Stated, I got hot in the heat making me feel dizzy.

## 2014-03-30 ENCOUNTER — Ambulatory Visit: Payer: No Typology Code available for payment source | Admitting: Family Medicine

## 2014-04-05 ENCOUNTER — Encounter (HOSPITAL_COMMUNITY): Payer: Self-pay | Admitting: Emergency Medicine

## 2014-04-05 ENCOUNTER — Observation Stay (HOSPITAL_COMMUNITY)
Admission: EM | Admit: 2014-04-05 | Discharge: 2014-04-06 | Disposition: A | Discharge status: Home / Self Care | Payer: Medicaid Other | Attending: Internal Medicine | Admitting: Internal Medicine

## 2014-04-05 ENCOUNTER — Inpatient Hospital Stay (HOSPITAL_COMMUNITY): Payer: Medicaid Other

## 2014-04-05 ENCOUNTER — Emergency Department (HOSPITAL_COMMUNITY): Payer: Medicaid Other

## 2014-04-05 DIAGNOSIS — Z23 Encounter for immunization: Secondary | ICD-10-CM | POA: Insufficient documentation

## 2014-04-05 DIAGNOSIS — G8929 Other chronic pain: Secondary | ICD-10-CM | POA: Insufficient documentation

## 2014-04-05 DIAGNOSIS — F17210 Nicotine dependence, cigarettes, uncomplicated: Secondary | ICD-10-CM | POA: Insufficient documentation

## 2014-04-05 DIAGNOSIS — N529 Male erectile dysfunction, unspecified: Secondary | ICD-10-CM | POA: Diagnosis present

## 2014-04-05 DIAGNOSIS — G459 Transient cerebral ischemic attack, unspecified: Secondary | ICD-10-CM

## 2014-04-05 DIAGNOSIS — Z590 Homelessness: Secondary | ICD-10-CM | POA: Insufficient documentation

## 2014-04-05 DIAGNOSIS — I639 Cerebral infarction, unspecified: Secondary | ICD-10-CM | POA: Diagnosis present

## 2014-04-05 DIAGNOSIS — I890 Lymphedema, not elsewhere classified: Secondary | ICD-10-CM | POA: Insufficient documentation

## 2014-04-05 DIAGNOSIS — R059 Cough, unspecified: Secondary | ICD-10-CM

## 2014-04-05 DIAGNOSIS — E785 Hyperlipidemia, unspecified: Secondary | ICD-10-CM | POA: Diagnosis present

## 2014-04-05 DIAGNOSIS — R05 Cough: Secondary | ICD-10-CM | POA: Insufficient documentation

## 2014-04-05 DIAGNOSIS — N528 Other male erectile dysfunction: Secondary | ICD-10-CM | POA: Insufficient documentation

## 2014-04-05 DIAGNOSIS — R531 Weakness: Principal | ICD-10-CM | POA: Insufficient documentation

## 2014-04-05 DIAGNOSIS — M549 Dorsalgia, unspecified: Secondary | ICD-10-CM | POA: Insufficient documentation

## 2014-04-05 DIAGNOSIS — I63319 Cerebral infarction due to thrombosis of unspecified middle cerebral artery: Secondary | ICD-10-CM

## 2014-04-05 DIAGNOSIS — I10 Essential (primary) hypertension: Secondary | ICD-10-CM | POA: Diagnosis present

## 2014-04-05 DIAGNOSIS — G4733 Obstructive sleep apnea (adult) (pediatric): Secondary | ICD-10-CM | POA: Diagnosis present

## 2014-04-05 HISTORY — DX: Transient cerebral ischemic attack, unspecified: G45.9

## 2014-04-05 LAB — COMPREHENSIVE METABOLIC PANEL
ALT: 17 U/L (ref 0–53)
AST: 15 U/L (ref 0–37)
Albumin: 3.4 g/dL — ABNORMAL LOW (ref 3.5–5.2)
Alkaline Phosphatase: 93 U/L (ref 39–117)
Anion gap: 14 (ref 5–15)
BUN: 12 mg/dL (ref 6–23)
CHLORIDE: 101 meq/L (ref 96–112)
CO2: 26 mEq/L (ref 19–32)
Calcium: 8.4 mg/dL (ref 8.4–10.5)
Creatinine, Ser: 0.76 mg/dL (ref 0.50–1.35)
GFR calc Af Amer: >90 mL/min (ref >90)
GFR calc non Af Amer: >90 mL/min (ref >90)
Glucose, Bld: 140 mg/dL — ABNORMAL HIGH (ref 70–99)
Potassium: 3.6 mEq/L — ABNORMAL LOW (ref 3.7–5.3)
SODIUM: 141 meq/L (ref 137–147)
Total Protein: 6.9 g/dL (ref 6.0–8.3)

## 2014-04-05 LAB — CBC
HCT: 37.7 % — ABNORMAL LOW (ref 39.0–52.0)
Hemoglobin: 12.4 g/dL — ABNORMAL LOW (ref 13.0–17.0)
MCH: 28.4 pg (ref 26.0–34.0)
MCHC: 32.9 g/dL (ref 30.0–36.0)
MCV: 86.5 fL (ref 78.0–100.0)
Platelets: 275 10*3/uL (ref 150–400)
RBC: 4.36 MIL/uL (ref 4.22–5.81)
RDW: 14.8 % (ref 11.5–15.5)
WBC: 7.4 10*3/uL (ref 4.0–10.5)

## 2014-04-05 LAB — RAPID URINE DRUG SCREEN, HOSP PERFORMED
Amphetamines: NOT DETECTED
BARBITURATES: NOT DETECTED
Benzodiazepines: NOT DETECTED
COCAINE: NOT DETECTED
Opiates: NOT DETECTED
Tetrahydrocannabinol: POSITIVE — AB

## 2014-04-05 LAB — I-STAT CHEM 8, ED
BUN: 11 mg/dL (ref 6–23)
CHLORIDE: 101 meq/L (ref 96–112)
CREATININE: 0.9 mg/dL (ref 0.50–1.35)
Calcium, Ion: 1.05 mmol/L — ABNORMAL LOW (ref 1.12–1.23)
GLUCOSE: 142 mg/dL — AB (ref 70–99)
HEMATOCRIT: 40 % (ref 39.0–52.0)
Hemoglobin: 13.6 g/dL (ref 13.0–17.0)
POTASSIUM: 3.4 meq/L — AB (ref 3.7–5.3)
Sodium: 141 mEq/L (ref 137–147)
TCO2: 26 mmol/L (ref 0–100)

## 2014-04-05 LAB — URINALYSIS, ROUTINE W REFLEX MICROSCOPIC
Bilirubin Urine: NEGATIVE
Glucose, UA: NEGATIVE mg/dL
HGB URINE DIPSTICK: NEGATIVE
Ketones, ur: NEGATIVE mg/dL
Leukocytes, UA: NEGATIVE
NITRITE: NEGATIVE
PH: 6 (ref 5.0–8.0)
Protein, ur: NEGATIVE mg/dL
Specific Gravity, Urine: 1.024 (ref 1.005–1.030)
Urobilinogen, UA: 1 mg/dL (ref 0.0–1.0)

## 2014-04-05 LAB — APTT: APTT: 37 s (ref 24–37)

## 2014-04-05 LAB — DIFFERENTIAL
Basophils Absolute: 0.1 10*3/uL (ref 0.0–0.1)
Basophils Relative: 1 % (ref 0–1)
Eosinophils Absolute: 0.1 10*3/uL (ref 0.0–0.7)
Eosinophils Relative: 1 % (ref 0–5)
Lymphocytes Relative: 24 % (ref 12–46)
Lymphs Abs: 1.8 10*3/uL (ref 0.7–4.0)
Monocytes Absolute: 0.5 10*3/uL (ref 0.1–1.0)
Monocytes Relative: 7 % (ref 3–12)
NEUTROS ABS: 5 10*3/uL (ref 1.7–7.7)
NEUTROS PCT: 67 % (ref 43–77)

## 2014-04-05 LAB — TSH: TSH: 0.784 u[IU]/mL (ref 0.350–4.500)

## 2014-04-05 LAB — CBG MONITORING, ED: Glucose-Capillary: 153 mg/dL — ABNORMAL HIGH (ref 70–99)

## 2014-04-05 LAB — I-STAT TROPONIN, ED: Troponin i, poc: 0 ng/mL (ref 0.00–0.08)

## 2014-04-05 LAB — PROTIME-INR
INR: 1.1 (ref 0.00–1.49)
Prothrombin Time: 14.3 seconds (ref 11.6–15.2)

## 2014-04-05 MED ORDER — FUROSEMIDE 40 MG PO TABS
40.0000 mg | ORAL_TABLET | Freq: Two times a day (BID) | ORAL | Status: DC
  Administered 2014-04-06: 40 mg via ORAL
  Filled 2014-04-05: qty 1

## 2014-04-05 MED ORDER — POTASSIUM CHLORIDE CRYS ER 20 MEQ PO TBCR
40.0000 meq | EXTENDED_RELEASE_TABLET | Freq: Once | ORAL | Status: DC
Start: 2014-04-05 — End: 2014-04-06

## 2014-04-05 MED ORDER — ENOXAPARIN SODIUM 40 MG/0.4ML ~~LOC~~ SOLN
40.0000 mg | SUBCUTANEOUS | Status: DC
  Administered 2014-04-05: 40 mg via SUBCUTANEOUS
  Filled 2014-04-05: qty 0.4

## 2014-04-05 MED ORDER — ACETAMINOPHEN 325 MG PO TABS: 650.0000 mg | ORAL_TABLET | Freq: Four times a day (QID) | ORAL | Status: DC | PRN

## 2014-04-05 MED ORDER — ONDANSETRON HCL 4 MG/2ML IJ SOLN: 4.0000 mg | Freq: Four times a day (QID) | INTRAMUSCULAR | Status: DC | PRN

## 2014-04-05 MED ORDER — POTASSIUM CHLORIDE CRYS ER 20 MEQ PO TBCR: 20.0000 meq | EXTENDED_RELEASE_TABLET | Freq: Every day | ORAL | Status: DC

## 2014-04-05 MED ORDER — SENNOSIDES-DOCUSATE SODIUM 8.6-50 MG PO TABS: 1.0000 | ORAL_TABLET | Freq: Every evening | ORAL | Status: DC | PRN

## 2014-04-05 MED ORDER — ALBUTEROL SULFATE (2.5 MG/3ML) 0.083% IN NEBU: 2.5000 mg | INHALATION_SOLUTION | RESPIRATORY_TRACT | Status: DC | PRN

## 2014-04-05 MED ORDER — PNEUMOCOCCAL VAC POLYVALENT 25 MCG/0.5ML IJ INJ
0.5000 mL | INJECTION | INTRAMUSCULAR | Status: AC
  Administered 2014-04-06: 0.5 mL via INTRAMUSCULAR
  Filled 2014-04-05: qty 0.5

## 2014-04-05 MED ORDER — CARVEDILOL 12.5 MG PO TABS
12.5000 mg | ORAL_TABLET | Freq: Two times a day (BID) | ORAL | Status: DC
  Administered 2014-04-06: 12.5 mg via ORAL
  Filled 2014-04-05: qty 1

## 2014-04-05 MED ORDER — ASPIRIN 81 MG PO CHEW
81.0000 mg | CHEWABLE_TABLET | Freq: Every day | ORAL | Status: DC
  Administered 2014-04-06: 81 mg via ORAL
  Filled 2014-04-05: qty 1

## 2014-04-05 MED ORDER — POTASSIUM CHLORIDE 10 MEQ/100ML IV SOLN
10.0000 meq | INTRAVENOUS | Status: AC
  Administered 2014-04-05 – 2014-04-06 (×2): 10 meq via INTRAVENOUS
  Filled 2014-04-05 (×2): qty 100

## 2014-04-05 MED ORDER — STROKE: EARLY STAGES OF RECOVERY BOOK
Freq: Once | Status: DC
  Filled 2014-04-05: qty 1
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
## 2014-04-05 NOTE — ED Notes (Signed)
[place], felt dizzy, noticed right arm weakness.   right grip strength was weaker per EMS.
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
## 2014-04-05 NOTE — ED Notes (Signed)
Xray came to pick up patient, patient is technically in the window for tPA until 1900, asked transporter to come back in 15 minutes.

## 2014-04-05 NOTE — Progress Notes (Signed)
Patient arrived to unit, alert and oriented. Patient oriented to room and equipment. Cardiac monitoring initiated. Initial assessment performed. NAD noted.

## 2014-04-05 NOTE — Code Documentation (Signed)
48[facility] [id number].  EMS reports [place] stand and reported dizziness and right arm weakness at 1430.  Code Stroke activated.  Patient taken to CT on arrival.  Stroke Team at the bedside.  Initial NIHSS 3, see documentation for details and code stroke times.  Dr. Leroy Kennedyamilo at the bedside.  No acute stroke treatment at this time.  Patient is too mild to treat with tPA but remains in the window until 1900 should symptoms worsen.  Bedside handoff with ED RN Brett CanalesSteve.

## 2014-04-06 ENCOUNTER — Inpatient Hospital Stay (HOSPITAL_COMMUNITY): Payer: Medicaid Other

## 2014-04-06 DIAGNOSIS — G458 Other transient cerebral ischemic attacks and related syndromes: Secondary | ICD-10-CM

## 2014-04-06 DIAGNOSIS — I890 Lymphedema, not elsewhere classified: Secondary | ICD-10-CM

## 2014-04-06 DIAGNOSIS — I63032 Cerebral infarction due to thrombosis of left carotid artery: Secondary | ICD-10-CM

## 2014-04-06 LAB — HEMOGLOBIN A1C
HEMOGLOBIN A1C: 6.2 % — AB (ref <5.7)
MEAN PLASMA GLUCOSE: 131 mg/dL — AB (ref <117)

## 2014-04-06 LAB — BASIC METABOLIC PANEL
ANION GAP: 11 (ref 5–15)
BUN: 11 mg/dL (ref 6–23)
CO2: 27 mEq/L (ref 19–32)
Calcium: 8.3 mg/dL — ABNORMAL LOW (ref 8.4–10.5)
Chloride: 104 mEq/L (ref 96–112)
Creatinine, Ser: 0.75 mg/dL (ref 0.50–1.35)
Glucose, Bld: 97 mg/dL (ref 70–99)
POTASSIUM: 3.7 meq/L (ref 3.7–5.3)
Sodium: 142 mEq/L (ref 137–147)

## 2014-04-06 LAB — VITAMIN B12: Vitamin B-12: 819 pg/mL (ref 211–911)

## 2014-04-06 LAB — LIPID PANEL
CHOLESTEROL: 132 mg/dL (ref 0–200)
HDL: 35 mg/dL — ABNORMAL LOW (ref >39)
LDL CALC: 88 mg/dL (ref 0–99)
Total CHOL/HDL Ratio: 3.8 RATIO
Triglycerides: 47 mg/dL (ref <150)
VLDL: 9 mg/dL (ref 0–40)

## 2014-04-06 LAB — MAGNESIUM: Magnesium: 2 mg/dL (ref 1.5–2.5)

## 2014-04-06 MED ORDER — ASPIRIN 81 MG PO CHEW: 81.0000 mg | CHEWABLE_TABLET | Freq: Every day | ORAL | Status: DC

## 2014-04-06 MED ORDER — POTASSIUM CHLORIDE CRYS ER 20 MEQ PO TBCR: 20.0000 meq | EXTENDED_RELEASE_TABLET | Freq: Every day | ORAL | Status: DC
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
## 2014-04-06 NOTE — Progress Notes (Signed)
STROKE TEAM PROGRESS NOTE   HISTORY [name] is an 48 y.o. male African-American gentleman with past medical history of cigarette smoking, hypertension, chronic back pain and bilateral lower extremity lymphedema is brought to the emergency department by EMS after complaining of right arm weakness and lightheadedness. [place] around 2:30 PM this afternoon 04/05/2014 when he started feeling lightheaded and he noticed that he had trouble moving his right upper extremity. No associated numbness. EMS was called who found the patient to have objective weakness on the right side. Patient denies any dysarthria, trouble with vision or hearing. He was emergently brought to the ED under code stroke where head CT scan was negative for intracranial hemorrhage. He denies history of palpitations, chest pain or shortness of breath. He did not pass out. No history of seizures. He denies headaches, vertigo, double vision, difficulty swallowing, slurred speech, language or vision impairment. Even though he has chronic low back pain, this has not increased recently.  Patient was not administered TPA secondary to low NIHSS. He was admitted for further evaluation and treatment.   SUBJECTIVE (INTERVAL HISTORY) Patient's interval history today does not match anything he said from yesterday. States he can't remember where he was or what happened. He has no complaints other that he thinks he needs to stay in the hospital for testing.    OBJECTIVE Temp:  [97.4 F (36.3 C)-98.6 F (37 C)] 97.4 F (36.3 C) (11/25 0945) Pulse Rate:  [63-107] 67 (11/25 0945) Cardiac Rhythm:  [-] Normal sinus rhythm (11/24 2300) Resp:  [11-23] 18 (11/25 0945) BP: (98-159)/(64-96) 146/85 mmHg (11/25 0945) SpO2:  [93 %-100 %] 100 % (11/25 0945) Weight:  [120.339 kg (265 lb 4.8 oz)-129.275 kg (285 lb)] 120.339 kg (265 lb 4.8 oz) (11/24 2144)   Recent Labs Lab 04/05/14 1543  GLUCAP  153*    Recent Labs Lab 04/05/14 1525 04/05/14 1534 04/05/14 2350  NA 141 141 142  K 3.6* 3.4* 3.7  CL 101 101 104  CO2 26  --  27  GLUCOSE 140* 142* 97  BUN 12 11 11   CREATININE 0.76 0.90 0.75  CALCIUM 8.4  --  8.3*  MG  --   --  2.0    Recent Labs Lab 04/05/14 1525  AST 15  ALT 17  ALKPHOS 93  BILITOT <0.2*  PROT 6.9  ALBUMIN 3.4*    Recent Labs Lab 04/05/14 1525 04/05/14 1534  WBC 7.4  --   NEUTROABS 5.0  --   HGB 12.4* 13.6  HCT 37.7* 40.0  MCV 86.5  --   PLT 275  --    No results for input(s): CKTOTAL, CKMB, CKMBINDEX, TROPONINI in the last 168 hours.  Recent Labs  04/05/14 1525  LABPROT 14.3  INR 1.10    Recent Labs  04/05/14 1718  COLORURINE YELLOW  LABSPEC 1.024  PHURINE 6.0  GLUCOSEU NEGATIVE  HGBUR NEGATIVE  BILIRUBINUR NEGATIVE  KETONESUR NEGATIVE  PROTEINUR NEGATIVE  UROBILINOGEN 1.0  NITRITE NEGATIVE  LEUKOCYTESUR NEGATIVE       Component Value Date/Time   CHOL 132 04/05/2014 2350   TRIG 47 04/05/2014 2350   HDL 35* 04/05/2014 2350   CHOLHDL 3.8 04/05/2014 2350   VLDL 9 04/05/2014 2350   LDLCALC 88 04/05/2014 2350   Lab Results  Component Value Date   HGBA1C 6.2* 04/05/2014      Component Value Date/Time   LABOPIA NONE DETECTED 04/05/2014 1718   COCAINSCRNUR NONE  DETECTED 04/05/2014 1718   LABBENZ NONE DETECTED 04/05/2014 1718   AMPHETMU NONE DETECTED 04/05/2014 1718   THCU POSITIVE* 04/05/2014 1718   LABBARB NONE DETECTED 04/05/2014 1718    No results for input(s): ETH in the last 168 hours.  Dg Chest 2 View  04/05/2014   CLINICAL DATA:  Productive cough for 3 days, smoker for 40 years  EXAM: CHEST  2 VIEW  COMPARISON:  05/16/2013  FINDINGS: The heart size and mediastinal contours are within normal limits. Both lungs are clear. The visualized skeletal structures are unremarkable.  IMPRESSION: No active cardiopulmonary disease.   Electronically Signed   By: Elige Ko   On: 04/05/2014 20:41   Ct Head (brain)  Wo Contrast  04/05/2014   CLINICAL DATA:  Code stroke. Sudden onset dizziness with right arm weakness.  EXAM: CT HEAD WITHOUT CONTRAST  TECHNIQUE: Contiguous axial images were obtained from the base of the skull through the vertex without intravenous contrast.  COMPARISON:  None.  FINDINGS: No evidence of an acute infarct, acute hemorrhage, mass lesion, mass effect or hydrocephalus. Minimal periventricular low attenuation. No air-fluid levels in the visualized portions of the paranasal sinuses or mastoid air cells. Small left mastoid effusion.  IMPRESSION: 1. No acute intracranial abnormality. These results were called by telephone at the time of interpretation on 04/05/2014 at 3:41 pm to Felicie Morn, PA, who verbally acknowledged these results. 2. Mild chronic microvascular white matter ischemic changes. 3. Small left mastoid effusion.   Electronically Signed   By: Leanna Battles M.D.   On: 04/05/2014 15:43   Mr Brain Wo Contrast  04/06/2014   CLINICAL DATA:  Stroke.  Generalized weakness  EXAM: MRI HEAD WITHOUT CONTRAST  MRA HEAD WITHOUT CONTRAST  TECHNIQUE: Multiplanar, multiecho pulse sequences of the brain and surrounding structures were obtained without intravenous contrast. Angiographic images of the head were obtained using MRA technique without contrast.  COMPARISON:  CT head 04/05/2014  FINDINGS: MRI HEAD FINDINGS  Image quality degraded by mild motion. In addition, the patient has severe sleep apnea and was scanned in the flex coil. Fast scanning sequences were utilized leading to diminished anatomic resolution.  Negative for acute infarct.  Patchy hyperintensity in the cerebral white matter bilaterally. Lesions are present in the periventricular and deep white matter. Brainstem and cerebellum are normal. No cortical infarct identified.  Negative for intracranial hemorrhage.  Negative for mass lesion.  Paranasal sinuses are clear.  MRA HEAD FINDINGS  Both vertebral arteries are patent to the  basilar. Basilar is widely patent. Superior cerebellar and posterior cerebral arteries are patent bilaterally.  Internal carotid artery is widely patent bilaterally. Anterior and middle cerebral arteries are patent bilaterally.  Suboptimal image quality due to artifact.  Negative for aneurysm.  IMPRESSION: Negative for acute infarct  Patchy periventricular and deep white matter hyperintensities. This could be due to chronic microvascular ischemia however demyelinating disease could have this appearance  No significant intracranial stenosis.   Electronically Signed   By: Marlan Palau M.D.   On: 04/06/2014 09:19   Mr Maxine Glenn Head/brain Wo Cm  04/06/2014   CLINICAL DATA:  Stroke.  Generalized weakness  EXAM: MRI HEAD WITHOUT CONTRAST  MRA HEAD WITHOUT CONTRAST  TECHNIQUE: Multiplanar, multiecho pulse sequences of the brain and surrounding structures were obtained without intravenous contrast. Angiographic images of the head were obtained using MRA technique without contrast.  COMPARISON:  CT head 04/05/2014  FINDINGS: MRI HEAD FINDINGS  Image quality degraded by mild motion. In addition,  the patient has severe sleep apnea and was scanned in the flex coil. Fast scanning sequences were utilized leading to diminished anatomic resolution.  Negative for acute infarct.  Patchy hyperintensity in the cerebral white matter bilaterally. Lesions are present in the periventricular and deep white matter. Brainstem and cerebellum are normal. No cortical infarct identified.  Negative for intracranial hemorrhage.  Negative for mass lesion.  Paranasal sinuses are clear.  MRA HEAD FINDINGS  Both vertebral arteries are patent to the basilar. Basilar is widely patent. Superior cerebellar and posterior cerebral arteries are patent bilaterally.  Internal carotid artery is widely patent bilaterally. Anterior and middle cerebral arteries are patent bilaterally.  Suboptimal image quality due to artifact.  Negative for aneurysm.  IMPRESSION:  Negative for acute infarct  Patchy periventricular and deep white matter hyperintensities. This could be due to chronic microvascular ischemia however demyelinating disease could have this appearance  No significant intracranial stenosis.   Electronically Signed   By: Marlan Palauharles  Clark M.D.   On: 04/06/2014 09:19     PHYSICAL EXAM Pleasant middle aged male not in distress.Awake alert. Afebrile. Head is nontraumatic. Neck is supple without bruit. Hearing is normal. Cardiac exam no murmur or gallop. Lungs are clear to auscultation. Distal pulses are well felt. Neurological Exam :   Awake  Alert oriented x 3. Normal speech and language.eye movements full without nystagmus.fundi were not visualized. Vision acuity and fields appear normal. Hearing is normal. Palatal movements are normal. Face symmetric. Tongue midline. Normal strength, tone, reflexes and coordination. Normal sensation. Gait deferred. ASSESSMENT/PLAN [name] is a 48 y.o. male with history of cigarette smoking, hypertension, chronic back pain and bilateral lower extremity lymphedema presenting with right arm weakness and lightheadedness. He did not receive IV t-PA  at due to mild deficits.   Inconsistent symptoms without focality  Resultant  No focal neuro deficits  MRI  No acute stroke, small vessel disease   MRA  Unremarkable   LDL 88  HgbA1c 6.2  Diet regular  Diet - low sodium heart healthy thin liquids  no antithrombotics prior to admission, now on aspirin 81 mg orally every day  No indication for further stroke workup  Therapy recommendations:  No therapy needs  Disposition:  Return home  No neuro followup recommended  Hypertension  Stable  Other Stroke Risk Factors  Cigarette smoker, advised to stop smoking  Obesity, Body mass index is 39.16 kg/(m^2).   Other Pertinent History  Homeless  Chronic lymphedema  Hypokalemia  Chronic generalized weakness and deconditioning  Hospital  day # 1  SHARON BIBY, MSN, APRN, ANVP-BC, AGPCNP-BC [facility] Pager: [phone] 04/06/2014 12:30 PM  I have personally examined this patient, reviewed notes, independently viewed imaging studies, participated in medical decision making and plan of care. I have made any additions or clarifications directly to the above note. Agree with note above. Suspect left brain TIA secondary to small vessel disease. Stroke workup is ongoing.  Delia HeadyPramod Saul Fabiano, MD Medical Director [facility] Pager: [phone] 04/06/2014 12:54 PM    To contact Stroke Continuity provider, please refer to [email or web address]. After hours, contact General Neurology

## 2014-04-06 NOTE — Progress Notes (Signed)
Soc Worker to follow up again about any available resources for the patient to assist him with shelter; B [place] RN,BSN,MHA [phone]
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
## 2014-04-06 NOTE — Plan of Care (Signed)
Problem: Acute Treatment Outcomes Goal: Neuro exam at baseline or improved Outcome: Progressing Goal: BP within ordered parameters Outcome: Progressing Goal: Airway maintained/protected Outcome: Progressing Goal: 02 Sats > 94% Outcome: Progressing Goal: Hemodynamically stable Outcome: Progressing     
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
## 2014-04-06 NOTE — Progress Notes (Signed)
Discharge orders received.  Discharge instructions and follow-up appointments reviewed with the patient.  VSS upon discharge.  IV removed and education complete.   Sondra ComeSilva, Salisa Broz M, RN

## 2014-04-06 NOTE — Progress Notes (Signed)
Pt left for MRI.
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
## 2014-04-06 NOTE — Clinical Social Work Psychosocial (Signed)
Clinical Social Work Department BRIEF PSYCHOSOCIAL ASSESSMENT 04/06/2014  Patient:  [name], [name]     Account Number:  [id number]     Admit date:  04/05/2014  Clinical Social Worker:  Glendon Axe, CLINICAL SOCIAL WORKER  Date/Time:  04/06/2014 10:00 AM  Referred by:  Physician  Date Referred:  04/06/2014 Referred for  Homelessness   Other Referral:   Interview type:  Patient Other interview type:    PSYCHOSOCIAL DATA Living Status:  OTHER Admitted from facility:   Level of care:   Primary support name:  [name] Primary support relationship to patient:  FAMILY Degree of support available:   Unkown    CURRENT CONCERNS Current Concerns  Other - See comment   Other Concerns:   Homelessness    SOCIAL WORK ASSESSMENT / PLAN Clinical Social Worker met with pt this morning in reference to homelessness. [name] provided pt with a packet of shelters and a "[place]" of free meals served in the [place] area. [name] reviewed shelter list with pt and pt reported he was not interested in placement at a shelter due to curfew restrictions. Pt also reported having family in the area which he chooses not to stay with. [name] also spoke with pt about orange card status and how to obtain medications at [facility]. [name] provided pt with a large coat and bus passes. [name] offered shelter services and pt declined. PT evaluation recommended SNF placement which pt also declined. [name] offered safe disposition and pt declined resources.  No further social work intervention required. [name] signing off.   Assessment/plan status:  No Further Intervention Required Other assessment/ plan:   Information/referral to community resources:   [name] provided shelter and free meal list/packets. [name] also provided pt with a large white coat.    PATIENT'S/FAMILY'S RESPONSE TO PLAN OF CARE: Pt alert and oriented X4. [name] offered shelter services and pt declined. Pt reported he is  familiar with shelters in the [place] area and curfew hours. Pt also stated he has been homeless for several years and further stated "I will be alright". Pt was kind and appreciated social work intervention.

## 2014-04-08 NOTE — Progress Notes (Signed)
PT eval addendum - added G-codes    04/06/14 1225  PT G-Codes **NOT FOR INPATIENT CLASS**  Functional Assessment Tool Used clinical judgment  Functional Limitation Mobility: Walking and moving around  Mobility: Walking and Moving Around Current Status (Z6109(G8978) CI  Mobility: Walking and Moving Around Goal Status (U0454(G8979) CI   Alvie HeidelbergShauna Folan, PT, DPT [phone]

## 2014-04-15 ENCOUNTER — Encounter (HOSPITAL_COMMUNITY): Payer: Self-pay | Admitting: Emergency Medicine

## 2014-04-15 ENCOUNTER — Emergency Department (HOSPITAL_COMMUNITY)
Admission: EM | Admit: 2014-04-15 | Discharge: 2014-04-15 | Disposition: A | Discharge status: Home / Self Care | Payer: No Typology Code available for payment source | Attending: Emergency Medicine | Admitting: Emergency Medicine

## 2014-04-15 DIAGNOSIS — Z043 Encounter for examination and observation following other accident: Secondary | ICD-10-CM | POA: Insufficient documentation

## 2014-04-15 DIAGNOSIS — Z760 Encounter for issue of repeat prescription: Secondary | ICD-10-CM | POA: Insufficient documentation

## 2014-04-15 DIAGNOSIS — Z720 Tobacco use: Secondary | ICD-10-CM | POA: Insufficient documentation

## 2014-04-15 DIAGNOSIS — I10 Essential (primary) hypertension: Secondary | ICD-10-CM | POA: Insufficient documentation

## 2014-04-15 DIAGNOSIS — W19XXXA Unspecified fall, initial encounter: Secondary | ICD-10-CM

## 2014-04-15 DIAGNOSIS — W1839XA Other fall on same level, initial encounter: Secondary | ICD-10-CM | POA: Insufficient documentation

## 2014-04-15 DIAGNOSIS — Y9389 Activity, other specified: Secondary | ICD-10-CM | POA: Insufficient documentation

## 2014-04-15 DIAGNOSIS — Y92521 Bus station as the place of occurrence of the external cause: Secondary | ICD-10-CM | POA: Insufficient documentation

## 2014-04-15 DIAGNOSIS — Z7982 Long term (current) use of aspirin: Secondary | ICD-10-CM | POA: Insufficient documentation

## 2014-04-15 DIAGNOSIS — Z79899 Other long term (current) drug therapy: Secondary | ICD-10-CM | POA: Insufficient documentation

## 2014-04-15 DIAGNOSIS — Z87828 Personal history of other (healed) physical injury and trauma: Secondary | ICD-10-CM | POA: Insufficient documentation

## 2014-04-15 DIAGNOSIS — Y998 Other external cause status: Secondary | ICD-10-CM | POA: Insufficient documentation

## 2014-04-15 MED ORDER — FUROSEMIDE 40 MG PO TABS: 40.0000 mg | ORAL_TABLET | Freq: Two times a day (BID) | ORAL | Status: DC

## 2014-04-15 MED ORDER — CARVEDILOL 12.5 MG PO TABS
12.5000 mg | ORAL_TABLET | Freq: Two times a day (BID) | ORAL | Status: DC
  Filled 2014-04-15: qty 1

## 2014-04-15 MED ORDER — CARVEDILOL 12.5 MG PO TABS
12.5000 mg | ORAL_TABLET | Freq: Two times a day (BID) | ORAL | Status: DC
Start: 2014-04-15 — End: 2014-04-15

## 2014-04-15 MED ORDER — FUROSEMIDE 40 MG PO TABS
40.0000 mg | ORAL_TABLET | Freq: Once | ORAL | Status: DC
  Filled 2014-04-15: qty 1

## 2014-04-15 MED ORDER — CARVEDILOL 12.5 MG PO TABS
12.5000 mg | ORAL_TABLET | Freq: Two times a day (BID) | ORAL | Status: DC
Start: 2014-04-15 — End: 2014-12-06

## 2014-04-15 NOTE — Discharge Instructions (Signed)
Fall Prevention and Home Safety Falls cause injuries and can affect all age groups. It is possible to prevent falls.  HOW TO PREVENT FALLS  Wear shoes with rubber soles that do not have an opening for your toes.  Keep the inside and outside of your house well lit.  Use night lights throughout your home.  Remove clutter from floors.  Clean up floor spills.  Remove throw rugs or fasten them to the floor with carpet tape.  Do not place electrical cords across pathways.  Put grab bars by your tub, shower, and toilet. Do not use towel bars as grab bars.  Put handrails on both sides of the stairway. Fix loose handrails.  Do not climb on stools or stepladders, if possible.  Do not wax your floors.  Repair uneven or unsafe sidewalks, walkways, or stairs.  Keep items you use a lot within reach.  Be aware of pets.  Keep emergency numbers next to the telephone.  Put smoke detectors in your home and near bedrooms. Ask your doctor what other things you can do to prevent falls. Document Released: 02/23/2009 Document Revised: 10/29/2011 Document Reviewed: 07/30/2011 [facility] Patient Information 2015 [place], [place]. This information is not intended to replace advice given to you by your health care provider. Make sure you discuss any questions you have with your health care provider. Hypertension Hypertension, commonly called high blood pressure, is when the force of blood pumping through your arteries is too strong. Your arteries are the blood vessels that carry blood from your heart throughout your body. A blood pressure reading consists of a higher number over a lower number, such as 110/72. The higher number (systolic) is the pressure inside your arteries when your heart pumps. The lower number (diastolic) is the pressure inside your arteries when your heart relaxes. Ideally you want your blood pressure below 120/80. Hypertension forces your heart to work harder to pump blood. Your  arteries may become narrow or stiff. Having hypertension puts you at risk for heart disease, stroke, and other problems.  RISK FACTORS Some risk factors for high blood pressure are controllable. Others are not.  Risk factors you cannot control include:   Race. You may be at higher risk if you are African American.  Age. Risk increases with age.  Gender. Men are at higher risk than women before age 48 years. After age 48, women are at higher risk than men. Risk factors you can control include:  Not getting enough exercise or physical activity.  Being overweight.  Getting too much fat, sugar, calories, or salt in your diet.  Drinking too much alcohol. SIGNS AND SYMPTOMS Hypertension does not usually cause signs or symptoms. Extremely high blood pressure (hypertensive crisis) may cause headache, anxiety, shortness of breath, and nosebleed. DIAGNOSIS  To check if you have hypertension, your health care provider will measure your blood pressure while you are seated, with your arm held at the level of your heart. It should be measured at least twice using the same arm. Certain conditions can cause a difference in blood pressure between your right and left arms. A blood pressure reading that is higher than normal on one occasion does not mean that you need treatment. If one blood pressure reading is high, ask your health care provider about having it checked again. TREATMENT  Treating high blood pressure includes making lifestyle changes and possibly taking medicine. Living a healthy lifestyle can help lower high blood pressure. You may need to change some of your  habits. Lifestyle changes may include:  Following the DASH diet. This diet is high in fruits, vegetables, and whole grains. It is low in salt, red meat, and added sugars.  Getting at least 2 hours of brisk physical activity every week.  Losing weight if necessary.  Not smoking.  Limiting alcoholic beverages.  Learning ways to  reduce stress. If lifestyle changes are not enough to get your blood pressure under control, your health care provider may prescribe medicine. You may need to take more than one. Work closely with your health care provider to understand the risks and benefits. HOME CARE INSTRUCTIONS  Have your blood pressure rechecked as directed by your health care provider.   Take medicines only as directed by your health care provider. Follow the directions carefully. Blood pressure medicines must be taken as prescribed. The medicine does not work as well when you skip doses. Skipping doses also puts you at risk for problems.   Do not smoke.   Monitor your blood pressure at home as directed by your health care provider. SEEK MEDICAL CARE IF:   You think you are having a reaction to medicines taken.  You have recurrent headaches or feel dizzy.  You have swelling in your ankles.  You have trouble with your vision. SEEK IMMEDIATE MEDICAL CARE IF:  You develop a severe headache or confusion.  You have unusual weakness, numbness, or feel faint.  You have severe chest or abdominal pain.  You vomit repeatedly.  You have trouble breathing. MAKE SURE YOU:   Understand these instructions.  Will watch your condition.  Will get help right away if you are not doing well or get worse. Document Released: 04/29/2005 Document Revised: 09/13/2013 Document Reviewed: 02/19/2013 [facility] Patient Information 2015 [place], [place]. This information is not intended to replace advice given to you by your health care provider. Make sure you discuss any questions you have with your health care provider.

## 2014-04-15 NOTE — ED Notes (Signed)
Waiting to see if social work can fill pt's medication. Social work to call back.

## 2014-04-15 NOTE — ED Notes (Signed)
SW came to triage to talk to RN. Sts she will talk to Amy to see about filling prescriptions.

## 2014-04-15 NOTE — ED Notes (Signed)
Bed: WTR6 Expected date:  Expected time:  Means of arrival:  Comments: EMS- back pain  
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
## 2014-04-15 NOTE — Progress Notes (Signed)
[facility] regarding medication assistance.  Patient has been seen in the ED four times in the last six months.  Patient has been offered lists of homeless shelters but has refused.  Patient stated, "I hate shelters."  Patient has the orange card, is [facility] with [facility] and [facility].  Patient [facility].  [facility] and was able to provide patient three days worth of Lasix and Coreg.  [facility] provided patient with his three day supply.  Patient reports he will be able to be seen and get his medications [facility] Monday.  EDCM encouraged  pt fllow up at pcp.  Patient verbalized understanding.  [facility] where he was going to stay tonight?  Patient smiled and said, "Wherever."  [facility] asked patient if there was anything else she could do for him?  Patient stated, "No thank you,you have done enough."  No further EDCM needs at this time.

## 2014-04-15 NOTE — ED Notes (Signed)
Pt alert, arrives from home, presents via EMS, c/o low back pain, ambulates to triage, resp even unlabored, skin pwd

## 2014-04-21 ENCOUNTER — Emergency Department (HOSPITAL_COMMUNITY)
Admission: EM | Admit: 2014-04-21 | Discharge: 2014-04-21 | Disposition: A | Discharge status: Home / Self Care | Payer: No Typology Code available for payment source | Attending: Emergency Medicine | Admitting: Emergency Medicine

## 2014-04-21 ENCOUNTER — Encounter (HOSPITAL_COMMUNITY): Payer: Self-pay | Admitting: Emergency Medicine

## 2014-04-21 DIAGNOSIS — I10 Essential (primary) hypertension: Secondary | ICD-10-CM | POA: Insufficient documentation

## 2014-04-21 DIAGNOSIS — R531 Weakness: Secondary | ICD-10-CM

## 2014-04-21 DIAGNOSIS — Z79899 Other long term (current) drug therapy: Secondary | ICD-10-CM | POA: Insufficient documentation

## 2014-04-21 DIAGNOSIS — Z720 Tobacco use: Secondary | ICD-10-CM | POA: Insufficient documentation

## 2014-04-21 DIAGNOSIS — Z87828 Personal history of other (healed) physical injury and trauma: Secondary | ICD-10-CM | POA: Insufficient documentation

## 2014-04-21 DIAGNOSIS — Z7982 Long term (current) use of aspirin: Secondary | ICD-10-CM | POA: Insufficient documentation

## 2014-04-21 LAB — I-STAT CHEM 8, ED
BUN: 14 mg/dL (ref 6–23)
Calcium, Ion: 1.12 mmol/L (ref 1.12–1.23)
Chloride: 101 mEq/L (ref 96–112)
Creatinine, Ser: 0.8 mg/dL (ref 0.50–1.35)
Glucose, Bld: 91 mg/dL (ref 70–99)
HCT: 40 % (ref 39.0–52.0)
Hemoglobin: 13.6 g/dL (ref 13.0–17.0)
Potassium: 3.9 mEq/L (ref 3.7–5.3)
Sodium: 138 mEq/L (ref 137–147)
TCO2: 24 mmol/L (ref 0–100)

## 2014-04-21 NOTE — ED Notes (Addendum)
Per EMS, Pt sts he has been feeling weak for 3 hours. Pt is homeless. Pt sts the sun drains him because he doesn't "have his shades." Pt hasn't eaten since this morning and has had about 16 oz of water. While on the way to hospital pt asked "Why are we going to the hospital?"
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
## 2014-04-21 NOTE — ED Notes (Signed)
RN went to talk to patient and patient had gotten out of bed without assistance and walked to bathroom.

## 2014-04-21 NOTE — ED Notes (Signed)
Bed: WA22 Expected date:  Expected time:  Means of arrival:  Comments: EMS 

## 2014-04-21 NOTE — Discharge Instructions (Signed)
Return to the ED with any concerns including fainting, chest pain, difficulty breathing, vomiting and not able to keep down liquids, decreased level of alertness/lethargy, or any other alarming symptoms   Emergency Department Resource Guide 1) Find a Doctor and Pay Out of Pocket Although you won't have to find out who is covered by your insurance plan, it is a good idea to ask around and get recommendations. You will then need to call the office and see if the doctor you have chosen will accept you as a new patient and what types of options they offer for patients who are self-pay. Some doctors offer discounts or will set up payment plans for their patients who do not have insurance, but you will need to ask so you aren't surprised when you get to your appointment.  2) Contact Your Local Health Department Not all health departments have doctors that can see patients for sick visits, but many do, so it is worth a call to see if yours does. If you don't know where your local health department is, you can check in your phone book. The CDC also has a tool to help you locate your state's health department, and many state websites also have listings of all of their local health departments.  3) Find a Walk-in Clinic If your illness is not likely to be very severe or complicated, you may want to try a walk in clinic. These are popping up all over the country in pharmacies, drugstores, and shopping centers. They're usually staffed by nurse practitioners or physician assistants that have been trained to treat common illnesses and complaints. They're usually fairly quick and inexpensive. However, if you have serious medical issues or chronic medical problems, these are probably not your best option.  No Primary Care Doctor: - Call Health Connect at  [phone] - they can help you locate a primary care doctor that  accepts your insurance, provides certain services, etc. - Physician Referral Service-  [phone]  Chronic Pain Problems: Organization         Address  Phone   Notes  [facility]  [phone] Patients need to be referred by their primary care doctor.   Medication Assistance: Organization         Address  Phone   Notes  [facility] Medication [facility] [address] [place], [place] [address] [phone] --Must be a resident of [facility] -- Must have NO insurance coverage whatsoever (no Medicaid/ Medicare, etc.) -- The pt. MUST have a primary care doctor that directs their care regularly and follows them in the community   MedAssist  [phone]   [employer]  [phone]    Agencies that provide inexpensive medical care: Organization         Address  Phone   Notes  [facility]  [phone]   [facility]    [phone]   [facility] [address] [place], [place] [address] [phone]   [facility] [address], [place] [phone]   [facility]anned Parenthood    [phone]   [facility]    [phone]   [facility]  [address], [place] Phone:  [phone], Fax:  [phone] Hours of Operation:  9 am - 6 pm, M-F.  Also accepts Medicaid/Medicare and self-pay.  [facility]  [address], [place] Phone: [phone], Fax: [phone]. Hours of Operation:  8:30 am - 5:30 pm, M-F.  Also accepts Medicaid and self-pay.  [facility] [address] [place] Phone: [phone]   [place], [place], [place] [phone], Ext. [phone] Mondays & Thursdays: 7-9 AM.  First 15 patients are seen on a first come, first serve basis.    [place] Providers:  Organization         Address  Phone   Notes  [facility] [address],  [place] [phone] Also accepts self-pay patients.  [facility] [address], [place]  [phone]   [place], [address], [place] [phone]   [facility] [address] [place] [phone]   Lucianne Lei [address], [place]   [phone] Only accepts [place] Access [place] patients after they have their name applied to their card.   Self-Pay (no insurance) in [facility]:  Organization         Address  Phone   Notes  Sickle Cell Patients, [facility] [place] [phone]   [facility] Urgent Care [place] [phone]   [facility] [place]  [place], [place], [place] [phone]   [facility]/Dr. Osei-Bonsu  [address] [place] or [address], [place] [phone] Phone number for both [place] and [place] locations is the same.  [facility]gent [facility] [address], [place] [phone]   [facility] [address] [place] or [address] [phone]   [facility] [address] [place] [phone], phone; [phone], fax Sees patients 1st and 3rd Saturday of every month.  Must not qualify for public or private insurance (i.e. Medicaid, Medicare, [place] Health Choice, Veterans' Benefits)  Household income should be no more than 200% of the poverty level The clinic cannot treat you if you are pregnant or think you are pregnant  Sexually transmitted diseases are not treated at the clinic.    Dental Care: Organization         Address  Phone  Notes  [facility] Department of [facility] [phone] Accepts children up to age 11 who are enrolled in  [place] or [place]; pregnant women with a Medicaid card; and children who have applied for Medicaid or [place] Health Choice, but were declined, whose parents can pay a reduced fee at time of service.  [facility] Department of [facility]  [address], [place] [phone] Accepts children up to age 74 who are enrolled in [place] or [place]; pregnant women with a Medicaid card; and children who have applied for Medicaid or [place] Health Choice, but were declined, whose parents can pay a reduced fee at time of service.  [place] Adult [facility] Access PROGRAM  [place] [phone] Patients are seen by appointment only. Walk-ins are not accepted. [place] will see patients 38 years of age and older. Monday - Tuesday (8am-5pm) Most Wednesdays (8:30-5pm) $30 per visit, cash only  [place]  PROGRAM  [address], [facility] [phone] Patients are seen by appointment only. Walk-ins are not accepted. [place] will see patients 72 years of age and older. One Wednesday Evening (Monthly: Volunteer Based).  $30 per visit, cash only  [place]  [phone] for adults; Children under age 63, call Graduate Pediatric Dentistry at [phone]. Children aged 75-14, please call [phone] to request a pediatric application.  Dental services are provided in all areas of dental care including fillings, crowns and bridges, complete and partial dentures, implants, gum treatment, root canals, and extractions. Preventive care is also provided. Treatment is provided to both adults and children. Patients are selected via a lottery and there is often a waiting list.   [facility] [address] [place]  [phone] [email or web address]   [facility] [address], [place] [phone], Ext. [phone] Second and Fourth Thursday of each month, opens at 6:30  AM; Clinic ends at 9 AM.  Patients are seen on a first-come first-served basis, and a limited number are seen during each clinic.   [facility]  [address] Hillard Danker [place], [place] [phone]   Eligibility Requirements You must have lived in [place], [place], or [place] counties for at least the last three months.   You cannot be eligible for state or federal sponsored [employer], including [employer], [place], or [employer].   You generally cannot be eligible for healthcare insurance through your employer.    How to apply: Eligibility screenings are held every Tuesday and Wednesday afternoon from 1:00 pm until 4:00 pm. You do not need an appointment for the interview!  [facility] [address] [place], [place]   [place]  [facility] Department  [place]  [phone]    Behavioral Health Resources in the Community: Intensive Outpatient Programs Organization         Address  Phone  Notes  [place]emet Warren. [address], [place], [place] [phone]   [facility] Outpatient [address], [place], [place]   ADS: Alcohol & Drug Svcs [address] [place], [place]   [place] [address]  [place], [place] or [phone]   Substance Abuse Resources Organization         Address  Phone  Notes  Alcohol and Drug Services  [phone]   [place]  [phone]   The [place]   Chinita Pester  [phone]   Residential & Outpatient Substance Abuse Program  [phone]   Psychological Services Organization         Address  Phone  Notes  [facility] [place]  [place]  [phone]   [place] [address], [place] or  [phone]    Mobile Crisis Teams Organization         Address  Phone  Notes  [facility]rapeutic Alternatives, Mobile Crisis Care Unit  [phone]   [facility]ive Psychotherapeutic Services  [address] [place], [place]   [place] [address], [place] [phone]    Self-Help/Support Groups Organization         Address  Phone             Notes  [place].  of [place] - variety of support groups  [place] Call for more information  Narcotics Anonymous (NA), [facility] [address], [employer] [place]  2 meetings at this location   Special educational needs teacher         Address  Phone  Notes  ASAP Residential Treatment [place],    [place]  [phone]   [facility]  [address] [place] [address], [place], [place]   [place], [place] [phone] Admissions: 8am-3pm M-F  Incentives Substance [place] [address],    [place], [place] [phone]   [facility] [address] [place], [place], [place]   [facility] [address],  [place], [place]   [facility]ight Programs - Intensive Outpatient [address], [place], [place]   [facility] [place].,  [place], [place] [phone] or [phone]   [facility]idential Treatment Services (RTS) [address] [place], [place] Accepts Medicaid  [facility] [place] [address]  [place] [phone] Substance Abuse/Addiction Treatment   [facility] Organization         Address  Phone  Notes  [facility]  [phone]   Domenic Schwab, PhD [address] Arlis Porta [place], [place]   [phone] or [phone]   [place], [place] [phone]   [facility] [address],  [place], [place] [phone] Insurance/Medicaid/sponsorship through [facility] and Families [address]                                    [place], [place] [phone] [place] [address], [place] [phone]    Dr. Adele Schilder  [phone]   [facility] Dept. 1) [address], [place] 2) [place] 3)  [place] [address], [place] [phone]  [phone]   [place] [phone] or [phone] (After Hours)

## 2014-04-23 ENCOUNTER — Encounter (HOSPITAL_COMMUNITY): Payer: Self-pay

## 2014-04-23 ENCOUNTER — Emergency Department (HOSPITAL_COMMUNITY)
Admission: EM | Admit: 2014-04-23 | Discharge: 2014-04-23 | Disposition: A | Discharge status: Home / Self Care | Payer: No Typology Code available for payment source | Attending: Emergency Medicine | Admitting: Emergency Medicine

## 2014-04-23 DIAGNOSIS — S8992XA Unspecified injury of left lower leg, initial encounter: Secondary | ICD-10-CM | POA: Insufficient documentation

## 2014-04-23 DIAGNOSIS — Y9389 Activity, other specified: Secondary | ICD-10-CM | POA: Insufficient documentation

## 2014-04-23 DIAGNOSIS — Z7982 Long term (current) use of aspirin: Secondary | ICD-10-CM | POA: Insufficient documentation

## 2014-04-23 DIAGNOSIS — I10 Essential (primary) hypertension: Secondary | ICD-10-CM | POA: Insufficient documentation

## 2014-04-23 DIAGNOSIS — W1839XA Other fall on same level, initial encounter: Secondary | ICD-10-CM | POA: Insufficient documentation

## 2014-04-23 DIAGNOSIS — S8991XA Unspecified injury of right lower leg, initial encounter: Secondary | ICD-10-CM | POA: Insufficient documentation

## 2014-04-23 DIAGNOSIS — Y9289 Other specified places as the place of occurrence of the external cause: Secondary | ICD-10-CM | POA: Insufficient documentation

## 2014-04-23 DIAGNOSIS — Y998 Other external cause status: Secondary | ICD-10-CM | POA: Insufficient documentation

## 2014-04-23 DIAGNOSIS — Z79899 Other long term (current) drug therapy: Secondary | ICD-10-CM | POA: Insufficient documentation

## 2014-04-23 DIAGNOSIS — Z720 Tobacco use: Secondary | ICD-10-CM | POA: Insufficient documentation

## 2014-04-23 DIAGNOSIS — W19XXXA Unspecified fall, initial encounter: Secondary | ICD-10-CM

## 2014-04-23 NOTE — Discharge Instructions (Signed)

## 2014-04-23 NOTE — ED Notes (Signed)
Per EMS, Patient fell in the parking lot of [facility]. Patient denies any injury with the fall, but complains of generalized weakness bilaterally to legs. Patient denies any neck or back pain. Patient presents with edema in both legs with history of the same. Vitals per EMS: 148/60, 78 HR, 20 RR, 99 % on RA.
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
## 2014-08-27 ENCOUNTER — Emergency Department (HOSPITAL_COMMUNITY)
Admission: EM | Admit: 2014-08-27 | Discharge: 2014-08-27 | Disposition: A | Discharge status: Home / Self Care | Payer: No Typology Code available for payment source | Attending: Emergency Medicine | Admitting: Emergency Medicine

## 2014-08-27 ENCOUNTER — Encounter (HOSPITAL_COMMUNITY): Payer: Self-pay | Admitting: Emergency Medicine

## 2014-08-27 ENCOUNTER — Emergency Department (HOSPITAL_COMMUNITY): Payer: No Typology Code available for payment source

## 2014-08-27 DIAGNOSIS — R600 Localized edema: Secondary | ICD-10-CM | POA: Insufficient documentation

## 2014-08-27 DIAGNOSIS — Z87828 Personal history of other (healed) physical injury and trauma: Secondary | ICD-10-CM | POA: Insufficient documentation

## 2014-08-27 DIAGNOSIS — I10 Essential (primary) hypertension: Secondary | ICD-10-CM | POA: Insufficient documentation

## 2014-08-27 DIAGNOSIS — Z7982 Long term (current) use of aspirin: Secondary | ICD-10-CM | POA: Insufficient documentation

## 2014-08-27 DIAGNOSIS — Z79899 Other long term (current) drug therapy: Secondary | ICD-10-CM | POA: Insufficient documentation

## 2014-08-27 DIAGNOSIS — R609 Edema, unspecified: Secondary | ICD-10-CM

## 2014-08-27 DIAGNOSIS — Z720 Tobacco use: Secondary | ICD-10-CM | POA: Insufficient documentation

## 2014-08-27 LAB — CBC WITH DIFFERENTIAL/PLATELET
BASOS PCT: 1 % (ref 0–1)
Basophils Absolute: 0.1 10*3/uL (ref 0.0–0.1)
EOS ABS: 0.1 10*3/uL (ref 0.0–0.7)
EOS PCT: 1 % (ref 0–5)
HEMATOCRIT: 38 % — AB (ref 39.0–52.0)
Hemoglobin: 12.5 g/dL — ABNORMAL LOW (ref 13.0–17.0)
LYMPHS ABS: 1.3 10*3/uL (ref 0.7–4.0)
Lymphocytes Relative: 18 % (ref 12–46)
MCH: 29.1 pg (ref 26.0–34.0)
MCHC: 32.9 g/dL (ref 30.0–36.0)
MCV: 88.4 fL (ref 78.0–100.0)
MONO ABS: 0.5 10*3/uL (ref 0.1–1.0)
Monocytes Relative: 7 % (ref 3–12)
NEUTROS ABS: 5.3 10*3/uL (ref 1.7–7.7)
NEUTROS PCT: 73 % (ref 43–77)
Platelets: 268 10*3/uL (ref 150–400)
RBC: 4.3 MIL/uL (ref 4.22–5.81)
RDW: 15 % (ref 11.5–15.5)
WBC: 7.1 10*3/uL (ref 4.0–10.5)

## 2014-08-27 LAB — URINALYSIS, ROUTINE W REFLEX MICROSCOPIC
Bilirubin Urine: NEGATIVE
GLUCOSE, UA: NEGATIVE mg/dL
HGB URINE DIPSTICK: NEGATIVE
Ketones, ur: NEGATIVE mg/dL
Leukocytes, UA: NEGATIVE
Nitrite: NEGATIVE
PH: 6 (ref 5.0–8.0)
Protein, ur: NEGATIVE mg/dL
Specific Gravity, Urine: 1.03 (ref 1.005–1.030)
Urobilinogen, UA: 1 mg/dL (ref 0.0–1.0)

## 2014-08-27 LAB — I-STAT TROPONIN, ED: Troponin i, poc: 0 ng/mL (ref 0.00–0.08)

## 2014-08-27 LAB — COMPREHENSIVE METABOLIC PANEL
ALT: 19 U/L (ref 0–53)
ANION GAP: 7 (ref 5–15)
AST: 21 U/L (ref 0–37)
Albumin: 3.7 g/dL (ref 3.5–5.2)
Alkaline Phosphatase: 86 U/L (ref 39–117)
BILIRUBIN TOTAL: 0.4 mg/dL (ref 0.3–1.2)
BUN: 15 mg/dL (ref 6–23)
CHLORIDE: 111 mmol/L (ref 96–112)
CO2: 25 mmol/L (ref 19–32)
Calcium: 8.4 mg/dL (ref 8.4–10.5)
Creatinine, Ser: 0.76 mg/dL (ref 0.50–1.35)
GFR calc non Af Amer: >90 mL/min (ref >90)
GLUCOSE: 109 mg/dL — AB (ref 70–99)
Potassium: 3.9 mmol/L (ref 3.5–5.1)
SODIUM: 143 mmol/L (ref 135–145)
TOTAL PROTEIN: 7 g/dL (ref 6.0–8.3)

## 2014-08-27 LAB — BRAIN NATRIURETIC PEPTIDE: B Natriuretic Peptide: 34.2 pg/mL (ref 0.0–100.0)

## 2014-08-27 MED ORDER — POTASSIUM CHLORIDE CRYS ER 20 MEQ PO TBCR: 20.0000 meq | EXTENDED_RELEASE_TABLET | Freq: Every day | ORAL | Status: DC

## 2014-08-27 MED ORDER — FUROSEMIDE 40 MG PO TABS: 40.0000 mg | ORAL_TABLET | Freq: Two times a day (BID) | ORAL | Status: DC

## 2014-08-27 NOTE — Discharge Instructions (Signed)
Peripheral Edema °You have swelling in your legs (peripheral edema). This swelling is due to excess accumulation of salt and water in your body. Edema may be a sign of heart, kidney or liver disease, or a side effect of a medication. It may also be due to problems in the leg veins. Elevating your legs and using special support stockings may be very helpful, if the cause of the swelling is due to poor venous circulation. Avoid long periods of standing, whatever the cause. °Treatment of edema depends on identifying the cause. Chips, pretzels, pickles and other salty foods should be avoided. Restricting salt in your diet is almost always needed. Water pills (diuretics) are often used to remove the excess salt and water from your body via urine. These medicines prevent the kidney from reabsorbing sodium. This increases urine flow. °Diuretic treatment may also result in lowering of potassium levels in your body. Potassium supplements may be needed if you have to use diuretics daily. Daily weights can help you keep track of your progress in clearing your edema. You should call your caregiver for follow up care as recommended. °SEEK IMMEDIATE MEDICAL CARE IF:  °· You have increased swelling, pain, redness, or heat in your legs. °· You develop shortness of breath, especially when lying down. °· You develop chest or abdominal pain, weakness, or fainting. °· You have a fever. °Document Released: 06/06/2004 Document Revised: 07/22/2011 Document Reviewed: 05/17/2009 °ExitCare® Patient Information ©2015 [employer]. This information is not intended to replace advice given to you by your health care provider. Make sure you discuss any questions you have with your health care provider. ° °

## 2014-08-27 NOTE — ED Notes (Signed)
Nurse currently starting IV 

## 2014-08-27 NOTE — ED Notes (Signed)
Bed: [id number] Expected date: 08/27/14 Expected time: 5:01 PM Means of arrival: Ambulance Comments: [place] bus station

## 2014-08-27 NOTE — ED Notes (Signed)
Per EMS: Pt c/o generalized weakness and lower extremity edema. Pt reports being out of his "fluid pill" for at least a weak. [place] when he felt like he was going to fall, so he lowered himself to the ground. [employer] was able to assist him to his feet, but he was very unsteady walking, so EMS was called. Pt denies any pain. A&O x 4.

## 2014-08-27 NOTE — ED Notes (Signed)
Pt c/o bilateral LE weakness and edema, states he has been out of his medication for about a week. Pt denies any pain, no SOB, no dizziness/ lightheadedness. A&O x 4. Lung sounds clear, respirations even and unlabored.
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
## 2014-08-27 NOTE — ED Notes (Signed)
RN made 2 attempts to get blood work, unable to obtain. ED phlebotomist to attempt.

## 2014-08-27 NOTE — ED Notes (Signed)
Delay in lab draw, waiting on arm band

## 2014-10-14 ENCOUNTER — Emergency Department (HOSPITAL_COMMUNITY)
Admission: EM | Admit: 2014-10-14 | Discharge: 2014-10-14 | Disposition: A | Discharge status: Home / Self Care | Payer: No Typology Code available for payment source | Attending: Emergency Medicine | Admitting: Emergency Medicine

## 2014-10-14 ENCOUNTER — Encounter (HOSPITAL_COMMUNITY): Payer: Self-pay | Admitting: Emergency Medicine

## 2014-10-14 DIAGNOSIS — E669 Obesity, unspecified: Secondary | ICD-10-CM | POA: Insufficient documentation

## 2014-10-14 DIAGNOSIS — Z720 Tobacco use: Secondary | ICD-10-CM | POA: Insufficient documentation

## 2014-10-14 DIAGNOSIS — Z79899 Other long term (current) drug therapy: Secondary | ICD-10-CM | POA: Insufficient documentation

## 2014-10-14 DIAGNOSIS — R2243 Localized swelling, mass and lump, lower limb, bilateral: Secondary | ICD-10-CM | POA: Insufficient documentation

## 2014-10-14 DIAGNOSIS — Z590 Homelessness: Secondary | ICD-10-CM | POA: Insufficient documentation

## 2014-10-14 DIAGNOSIS — R600 Localized edema: Secondary | ICD-10-CM

## 2014-10-14 DIAGNOSIS — Z87828 Personal history of other (healed) physical injury and trauma: Secondary | ICD-10-CM | POA: Insufficient documentation

## 2014-10-14 DIAGNOSIS — G8929 Other chronic pain: Secondary | ICD-10-CM | POA: Insufficient documentation

## 2014-10-14 DIAGNOSIS — I10 Essential (primary) hypertension: Secondary | ICD-10-CM | POA: Insufficient documentation
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
## 2014-10-14 NOTE — ED Notes (Signed)
Per EMS: Pt from outside under a shelter.  States that he hasn't been able to walk for appx 5 hrs d/t edema in legs.  Pt states that this edema gets worse in the heat.  Denies any other complaints.

## 2014-10-14 NOTE — Discharge Instructions (Signed)
Peripheral Edema °You have swelling in your legs (peripheral edema). This swelling is due to excess accumulation of salt and water in your body. Edema may be a sign of heart, kidney or liver disease, or a side effect of a medication. It may also be due to problems in the leg veins. Elevating your legs and using special support stockings may be very helpful, if the cause of the swelling is due to poor venous circulation. Avoid long periods of standing, whatever the cause. °Treatment of edema depends on identifying the cause. Chips, pretzels, pickles and other salty foods should be avoided. Restricting salt in your diet is almost always needed. Water pills (diuretics) are often used to remove the excess salt and water from your body via urine. These medicines prevent the kidney from reabsorbing sodium. This increases urine flow. °Diuretic treatment may also result in lowering of potassium levels in your body. Potassium supplements may be needed if you have to use diuretics daily. Daily weights can help you keep track of your progress in clearing your edema. You should call your caregiver for follow up care as recommended. °SEEK IMMEDIATE MEDICAL CARE IF:  °· You have increased swelling, pain, redness, or heat in your legs. °· You develop shortness of breath, especially when lying down. °· You develop chest or abdominal pain, weakness, or fainting. °· You have a fever. °Document Released: 06/06/2004 Document Revised: 07/22/2011 Document Reviewed: 05/17/2009 °ExitCare® Patient Information ©2015 [employer]. This information is not intended to replace advice given to you by your health care provider. Make sure you discuss any questions you have with your health care provider. ° °

## 2014-10-21 ENCOUNTER — Encounter (HOSPITAL_COMMUNITY): Payer: Self-pay | Admitting: Emergency Medicine

## 2014-10-21 ENCOUNTER — Emergency Department (HOSPITAL_COMMUNITY)
Admission: EM | Admit: 2014-10-21 | Discharge: 2014-10-21 | Disposition: A | Discharge status: Home / Self Care | Payer: No Typology Code available for payment source | Attending: Emergency Medicine | Admitting: Emergency Medicine

## 2014-10-21 DIAGNOSIS — Z79899 Other long term (current) drug therapy: Secondary | ICD-10-CM | POA: Insufficient documentation

## 2014-10-21 DIAGNOSIS — Z720 Tobacco use: Secondary | ICD-10-CM | POA: Insufficient documentation

## 2014-10-21 DIAGNOSIS — Y92521 Bus station as the place of occurrence of the external cause: Secondary | ICD-10-CM | POA: Insufficient documentation

## 2014-10-21 DIAGNOSIS — Y998 Other external cause status: Secondary | ICD-10-CM | POA: Insufficient documentation

## 2014-10-21 DIAGNOSIS — W1839XA Other fall on same level, initial encounter: Secondary | ICD-10-CM | POA: Insufficient documentation

## 2014-10-21 DIAGNOSIS — I10 Essential (primary) hypertension: Secondary | ICD-10-CM | POA: Insufficient documentation

## 2014-10-21 DIAGNOSIS — Y9301 Activity, walking, marching and hiking: Secondary | ICD-10-CM | POA: Insufficient documentation

## 2014-10-21 DIAGNOSIS — S50312A Abrasion of left elbow, initial encounter: Secondary | ICD-10-CM

## 2014-10-21 MED ORDER — BACITRACIN ZINC 500 UNIT/GM EX OINT
1.0000 "application " | TOPICAL_OINTMENT | Freq: Two times a day (BID) | CUTANEOUS | Status: DC
  Administered 2014-10-21: 1 via TOPICAL
  Filled 2014-10-21: qty 1.8
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
## 2014-10-21 NOTE — ED Notes (Signed)
Bed: WA01 Expected date:  Expected time:  Means of arrival:  Comments: weakness
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
## 2014-10-21 NOTE — Discharge Instructions (Signed)
Abrasion An abrasion is a cut or scrape of the skin. Abrasions do not extend through all layers of the skin and most heal within 10 days. It is important to care for your abrasion properly to prevent infection. CAUSES  Most abrasions are caused by falling on, or gliding across, the ground or other surface. When your skin rubs on something, the outer and inner layer of skin rubs off, causing an abrasion. DIAGNOSIS  Your caregiver will be able to diagnose an abrasion during a physical exam.  TREATMENT  Your treatment depends on how large and deep the abrasion is. Generally, your abrasion will be cleaned with water and a mild soap to remove any dirt or debris. An antibiotic ointment may be put over the abrasion to prevent an infection. A bandage (dressing) may be wrapped around the abrasion to keep it from getting dirty.  You may need a tetanus shot if:  You cannot remember when you had your last tetanus shot.  You have never had a tetanus shot.  The injury broke your skin. If you get a tetanus shot, your arm may swell, get red, and feel warm to the touch. This is common and not a problem. If you need a tetanus shot and you choose not to have one, there is a rare chance of getting tetanus. Sickness from tetanus can be serious.  HOME CARE INSTRUCTIONS   If a dressing was applied, change it at least once a day or as directed by your caregiver. If the bandage sticks, soak it off with warm water.   Wash the area with water and a mild soap to remove all the ointment 2 times a day. Rinse off the soap and pat the area dry with a clean towel.   Reapply any ointment as directed by your caregiver. This will help prevent infection and keep the bandage from sticking. Use gauze over the wound and under the dressing to help keep the bandage from sticking.   Change your dressing right away if it becomes wet or dirty.   Only take over-the-counter or prescription medicines for pain, discomfort, or fever as  directed by your caregiver.   Follow up with your caregiver within 24-48 hours for a wound check, or as directed. If you were not given a wound-check appointment, look closely at your abrasion for redness, swelling, or pus. These are signs of infection. SEEK IMMEDIATE MEDICAL CARE IF:   You have increasing pain in the wound.   You have redness, swelling, or tenderness around the wound.   You have pus coming from the wound.   You have a fever or persistent symptoms for more than 2-3 days.  You have a fever and your symptoms suddenly get worse.  You have a bad smell coming from the wound or dressing.  MAKE SURE YOU:   Understand these instructions.  Will watch your condition.  Will get help right away if you are not doing well or get worse. Document Released: 02/06/2005 Document Revised: 04/15/2012 Document Reviewed: 04/02/2011 [facility] Patient Information 2015 [place], [place]. This information is not intended to replace advice given to you by your health care provider. Make sure you discuss any questions you have with your health care provider.   Emergency Department Resource Guide 1) Find a Doctor and Pay Out of Pocket Although you won't have to find out who is covered by your insurance plan, it is a good idea to ask around and get recommendations. You will then need to call  the office and see if the doctor you have chosen will accept you as a new patient and what types of options they offer for patients who are self-pay. Some doctors offer discounts or will set up payment plans for their patients who do not have insurance, but you will need to ask so you aren't surprised when you get to your appointment.  2) Contact Your Local Health Department Not all health departments have doctors that can see patients for sick visits, but many do, so it is worth a call to see if yours does. If you don't know where your local health department is, you can check in your phone book. The CDC  also has a tool to help you locate your state's health department, and many state websites also have listings of all of their local health departments.  3) Find a Walk-in Clinic If your illness is not likely to be very severe or complicated, you may want to try a walk in clinic. These are popping up all over the country in pharmacies, drugstores, and shopping centers. They're usually staffed by nurse practitioners or physician assistants that have been trained to treat common illnesses and complaints. They're usually fairly quick and inexpensive. However, if you have serious medical issues or chronic medical problems, these are probably not your best option.  No Primary Care Doctor: - Call Health Connect at  [phone] - they can help you locate a primary care doctor that  accepts your insurance, provides certain services, etc. - Physician Referral Service- [phone]  Chronic Pain Problems: Organization         Address  Phone   Notes  [facility]  [phone] Patients need to be referred by their primary care doctor.   Medication Assistance: Organization         Address  Phone   Notes  [facility] [address] [place], [place] [address] [phone](336) [phone] --Must be a resident of [place] -- Must have NO insurance coverage whatsoever (no Medicaid/ Medicare, etc.) -- The pt. MUST have a primary care doctor that directs their care regularly and follows them in the community   MedAssist  [phone]) [phone]   [employer]  [phone]    Agencies that provide inexpensive medical care: Organization         Address  Phone   Notes  [facility]  [phone]) [phone]   [facility]    [phone]) [phone]   [facility] [address] [place], [place] [address] [phone]   [facility] [address], [place] [phone]   [facility]anned Parenthood    [phone](336)  [phone]   [facility]    [phone]) [phone]   Community Health and [facility]  [address], [place] Phone:  [phone]) [phone], Fax:  [phone] Hours of Operation:  9 am - 6 pm, M-F.  Also accepts Medicaid/Medicare and self-pay.  [facility]  [address], [place] Phone: [phone](336) [phone], Fax: [phone]. Hours of Operation:  8:30 am - 5:30 pm, M-F.  Also accepts Medicaid and self-pay.  [facility] [place] [address], [place] Phone: [phone](336) [phone]   [facility] [address], [place], [place] [phone], Ext. [phone] Mondays & Thursdays: 7-9 AM.  First 15 patients are seen on a first come, first serve basis.  Medicaid-accepting [facility] Providers:  Organization         Address  Phone   Notes  [facility] [address], [place] [phone] Also accepts self-pay patients.  [facility] [address] Laurell Josephs [place], [place]  [phone]   [facility] [address], [place] [phone]   [facility] [address], [place] [phone]   Renaye Rakers [address], [place]   [phone] Only accepts [place] patients after they have their name applied to their card.   Self-Pay (no insurance) in [facility]:  Organization         Address  Phone   Notes  Sickle Cell Patients, [facility] [address] [place], [place] [phone]   [facility] [address] [place], [place] [phone]   [facility]  [address],  [phone]   [facility]/Dr. Osei-Bonsu  [address] [place] or [address], [place] [phone] Phone number for both [place] and [place] locations is the same.  [facility]gent [facility] and [facility] [address] [place] [phone]   [facility] [address] [place] or [address] [phone]   [facility] [address] [place] [phone], phone; [phone], fax Sees patients 1st and 3rd Saturday of every month.  Must not qualify for public or private insurance (i.e. Medicaid, Medicare, [place] Health Choice, Veterans' Benefits)  Household income should be no more than 200% of the poverty level The clinic cannot treat you if you are pregnant or think you are pregnant  Sexually transmitted diseases are not treated at the clinic.    Dental Care: Organization         Address  Phone  Notes  [facility] Department of [facility] [address] [place], [place] [phone] Accepts children up to age 26 who are enrolled in [place] or [place] Health Choice; pregnant women with a Medicaid card; and children who have applied for Medicaid or [place] Health Choice, but were declined, whose parents can pay a reduced fee at time of service.  [facility]  [address], [place] [phone] Accepts children up to age 75 who are enrolled in [place] or [place] Health Choice; pregnant women with a Medicaid card; and children who have applied for Medicaid or [place] Health Choice, but were declined, whose parents can pay a reduced fee at time of service.  [facility]  [address] [place], [place] [phone] Patients are seen by appointment only. Walk-ins are not accepted. [facility] will see patients 76 years of age and older. Monday - Tuesday (8am-5pm) Most Wednesdays (8:30-5pm) $30 per visit, cash only  [facility] Adult Dental Access PROGRAM  [address], [facility] [phone] Patients are seen by appointment only. Walk-ins are not accepted. [facility] will see patients 40 years of age and older. One  Wednesday Evening (Monthly: Volunteer Based).  $30 per visit, cash only  [employer]  [phone] for adults; Children under age 19, call Graduate Pediatric Dentistry at [phone]. Children aged 53-14, please call [phone] to request a  pediatric application.  Dental services are provided in all areas of dental care including fillings, crowns and bridges, complete and partial dentures, implants, gum treatment, root canals, and extractions. Preventive care is also provided. Treatment is provided to both adults and children. Patients are selected via a lottery and there is often a waiting list.   [facility] [address], [place]  [phone]) [phone] [email or web address]   [facility] [address], [place], [place] [phone], Ext. [phone] Second and Fourth Thursday of each month, opens at 6:30 AM; Clinic ends at 9 AM.  Patients are seen on a first-come first-served basis, and a limited number are seen during each clinic.   [facility]  [address], [place], [place] [phone]) [phone]   Eligibility Requirements You must have lived in [place], or [place] counties for at least the last three months.   You cannot be eligible for state or federal sponsored National Cityhealthcare insurance, including CIG[employer]Veterans Administration, [place]icaid, or [employer]icare.   You generally cannot be eligible for healthcare insurance through your employer.    How to apply: Eligibility screenings are held every Tuesday and Wednesday afternoon from 1:00 pm until 4:00 pm. You do not need an appointment for the interview!  [facility] [address], [place], [place] [phone]   [facility]  [phone]   [facility] [facility]  [phone]   [facility]  [phone]    Behavioral Health Resources in the Community: Intensive Outpatient Programs Organization          Address  Phone  Notes  [facility] [address], [place], [place] [phone]   [facility] [address], [place], [place] [phone]   [facility] [address], [place], [place]  [phone]   [facility] [address],  [place], [place] [phone] or [phone]   Substance Abuse Resources Organization         Address  Phone  Notes  [facility]  [phone]   [facility]  [phone]   [facility]  [phone]   [facility]  [phone]   Residential & Outpatient Substance Abuse Program  [phone]   Psychological Services Organization         Address  Phone  Notes  [facility]  [phone]   [facility]  [phone]- [phone]   [facility] [address], [place] [phone] or [phone]    Mobile Crisis Teams Organization         Address  Phone  Notes  [facility], Mobile Crisis Care Unit  [phone]   [facility]  [address] [place], [place] [phone]   Doristine LocksSharon DeEsch [address] [place] [phone]    Self-Help/Support Groups Organization         Address  Phone             Notes  [facility] [place] - variety of support groups  336- [phone] Call for more information  Narcotics Anonymous (NA), [facility] [address], [place]  2 meetings at this location   Statisticianesidential Treatment Programs Organization         Address  Phone  Notes  [facility] [address],    [place]  [phone]   [facility]  [address], [place] [phone],  [place], [place] [phone]   [facility] [address] [place], [place] [phone] Admissions: 8am-3pm M-F  [facility] [address],    [place], [place] [phone]   [facility] [address] [place], [place], [place] [phone]   [facility] [address]  [place], [place] [phone]   [facility] - Intensive Outpatient [address], [place], [place] [phone]   [facility] ([facility]iction [facility] [address] [place]  [place], [place] [phone] or [phone]   [facility] ([facility]) [address] [place], [place] [phone] Accepts Medicaid  [facility] [address],  [place] [phone] Substance Abuse/Addiction Treatment   [facility] Organization         Address  Phone  Notes  [facility]  [phone]   Angie Fava, PhD [address] Ervin Knack [place], [place]   [phone] or [phone]   [facility]   [address] [place], [place] [phone]   [facility] [address] [place], [place] [phone] Insurance/Medicaid/sponsorship through [facility] and Families [address]                                    [place], [place] [phone] Therapy/tele-psych/case  [facility] [address] [place], [place] [phone]    Dr. Lolly Mustache  [phone]   [facility] [place]  [employer] [facility] Dept. 1) [address] [place] 2) [address] [place] 3)  [address], [place] [phone]  [phone]   [facility] Child Abuse Hotline [phone]) [phone] or [phone] (After Hours)
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
## 2014-11-19 ENCOUNTER — Encounter (HOSPITAL_COMMUNITY): Payer: Self-pay | Admitting: Emergency Medicine

## 2014-11-19 ENCOUNTER — Emergency Department (HOSPITAL_COMMUNITY)
Admission: EM | Admit: 2014-11-19 | Discharge: 2014-11-19 | Disposition: A | Discharge status: Home / Self Care | Payer: No Typology Code available for payment source | Attending: Emergency Medicine | Admitting: Emergency Medicine

## 2014-11-19 DIAGNOSIS — Z79899 Other long term (current) drug therapy: Secondary | ICD-10-CM | POA: Insufficient documentation

## 2014-11-19 DIAGNOSIS — Y929 Unspecified place or not applicable: Secondary | ICD-10-CM | POA: Insufficient documentation

## 2014-11-19 DIAGNOSIS — I890 Lymphedema, not elsewhere classified: Secondary | ICD-10-CM | POA: Insufficient documentation

## 2014-11-19 DIAGNOSIS — Z87828 Personal history of other (healed) physical injury and trauma: Secondary | ICD-10-CM | POA: Insufficient documentation

## 2014-11-19 DIAGNOSIS — W19XXXA Unspecified fall, initial encounter: Secondary | ICD-10-CM | POA: Insufficient documentation

## 2014-11-19 DIAGNOSIS — Z043 Encounter for examination and observation following other accident: Secondary | ICD-10-CM | POA: Insufficient documentation

## 2014-11-19 DIAGNOSIS — I10 Essential (primary) hypertension: Secondary | ICD-10-CM | POA: Insufficient documentation

## 2014-11-19 DIAGNOSIS — Y939 Activity, unspecified: Secondary | ICD-10-CM | POA: Insufficient documentation

## 2014-11-19 DIAGNOSIS — Y999 Unspecified external cause status: Secondary | ICD-10-CM | POA: Insufficient documentation

## 2014-11-19 DIAGNOSIS — Z720 Tobacco use: Secondary | ICD-10-CM | POA: Insufficient documentation

## 2014-11-19 MED ORDER — SODIUM CHLORIDE 0.9 % IV BOLUS (SEPSIS)
1000.0000 mL | Freq: Once | INTRAVENOUS | Status: DC
Start: 2014-11-19 — End: 2014-11-19

## 2014-11-19 NOTE — ED Notes (Signed)
Pt reports his feeling fine, and is refusing lab collection. Pt is requesting bus passes and food. MD made aware

## 2014-11-19 NOTE — Discharge Instructions (Signed)
Heat-Related Illness °Heat-related illnesses occur when the body is unable to properly cool itself. The body normally cools itself by sweating. However, under some conditions sweating is not enough. In these cases, a person's body temperature rises rapidly. Very high body temperatures may damage the brain or other vital organs. Some examples of heat-related illnesses include: °· Heat stroke. This occurs when the body is unable to regulate its temperature. The body's temperature rises rapidly, the sweating mechanism fails, and the body is unable to cool down. Body temperature may rise to 106° F (41° C) or higher within 10 to 15 minutes. Heat stroke can cause death or permanent disability if emergency treatment is not provided. °· Heat exhaustion. This is a milder form of heat-related illness that can develop after several days of exposure to high temperatures and not enough fluids. It is the body's response to an excessive loss of the water and salt contained in sweat. °· Heat cramps. These usually affect people who sweat a lot during heavy activity. This sweating drains the body's salt and moisture. The low salt level in the muscles causes painful cramps. Heat cramps may also be a symptom of heat exhaustion. Heat cramps usually occur in the abdomen, arms, or legs. Get medical attention for cramps if you have heart problems or are on a low-sodium diet. °Those that are at greatest risk for heat-related illnesses include:  °· The elderly. °· Infant and the very young. °· People with mental illness and chronic diseases. °· People who are overweight (obese). °· Young and healthy people can even succumb to heat if they participate in strenuous physical activities during hot weather. °CAUSES  °Several factors affect the body's ability to cool itself during extremely hot weather. When the humidity is high, sweat will not evaporate as quickly. This prevents the body from releasing heat quickly. Other factors that can affect  the body's ability to cool down include:  °· Age. °· Obesity. °· Fever. °· Dehydration. °· Heart disease. °· Mental illness. °· Poor circulation. °· Sunburn. °· Prescription drug use. °· Alcohol use. °SYMPTOMS  °Heat stroke: Warning signs of heat stroke vary, but may include: °· An extremely high body temperature (above 103°F orally). °· A fast, strong pulse. °· Dizziness. °· Confusion. °· Red, hot, and dry skin. °· No sweating. °· Throbbing headache. °· Feeling sick to your stomach (nauseous). °· Unconsciousness. °Heat exhaustion: Warning signs of heat exhaustion include: °· Heavy sweating. °· Tiredness. °· Headache. °· Paleness. °· Weakness. °· Feeling sick to your stomach (nauseous) or vomiting. °· Muscle cramps. °Heat cramps °· Muscle pains or spasms. °TREATMENT  °Heat stroke °· Get into a cool environment. An indoor place that is air-conditioned may be best. °· Take a cool shower or bath. Have someone around to make sure you are okay. °· Take your temperature. Make sure it is going down. °Heat exhaustion °· Drink plenty of fluids. Do not drink liquids that contain caffeine, alcohol, or large amounts of sugar. These cause you to lose more body fluid. Also, avoid very cold drinks. They can cause stomach cramps. °· Get into a cool environment. An indoor place that is air-conditioned may be best. °· Take a cool shower or bath. Have someone around to make sure you are okay. °· Put on lightweight clothing. °Heat cramps °· Stop whatever activity you were doing. Do not attempt to do that activity for at least 3 hours after the cramps have gone away. °· Get into a cool environment. An indoor   place that is air-conditioned may be best. °HOME CARE INSTRUCTIONS  °To protect your health when temperatures are extremely high, follow these tips: °· During heavy exercise in a hot environment, drink two to four glasses (16-32 ounces) of cool fluids each hour. Do not wait until you are thirsty to drink. Warning: If your caregiver  limits the amount of fluid you drink or has you on water pills, ask how much you should drink while the weather is hot. °· Do not drink liquids that contain caffeine, alcohol, or large amounts of sugar. These cause you to lose more body fluid. °· Avoid very cold drinks. They can cause stomach cramps. °· Wear appropriate clothing. Choose lightweight, light-colored, loose-fitting clothing. °· If you must be outdoors, try to limit your outdoor activity to morning and evening hours. Try to rest often in shady areas. °· If you are not used to working or exercising in a hot environment, start slowly and pick up the pace gradually. °· Stay cool in an air-conditioned place if possible. If your home does not have air conditioning, go to the shopping mall or public library. °· Taking a cool shower or bath may help you cool off. °SEEK MEDICAL CARE IF:  °· You see any of the symptoms listed above. You may be dealing with a life-threatening emergency. °· Symptoms worsen or last longer than 1 hour. °· Heat cramps do not get better in 1 hour. °MAKE SURE YOU:  °· Understand these instructions. °· Will watch your condition. °· Will get help right away if you are not doing well or get worse. °Document Released: 02/06/2008 Document Revised: 07/22/2011 Document Reviewed: 02/06/2008 °[employer]® Patient Information ©2015 [employer]. This information is not intended to replace advice given to you by your health care provider. Make sure you discuss any questions you have with your health care provider. ° °

## 2014-11-19 NOTE — ED Notes (Signed)
Bed: [id number] Expected date:  Expected time:  Means of arrival:  Comments: EMS-Heat Exhaustion

## 2014-11-19 NOTE — ED Notes (Signed)
Walked pt to and from restroom.pt was little unstable however pt did info me he is normally unstable
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
## 2014-11-26 ENCOUNTER — Encounter (HOSPITAL_COMMUNITY): Payer: Self-pay | Admitting: Emergency Medicine

## 2014-11-26 ENCOUNTER — Emergency Department (HOSPITAL_COMMUNITY)
Admission: EM | Admit: 2014-11-26 | Discharge: 2014-11-26 | Disposition: A | Discharge status: Home / Self Care | Payer: No Typology Code available for payment source | Attending: Emergency Medicine | Admitting: Emergency Medicine

## 2014-11-26 DIAGNOSIS — I10 Essential (primary) hypertension: Secondary | ICD-10-CM | POA: Insufficient documentation

## 2014-11-26 DIAGNOSIS — Z720 Tobacco use: Secondary | ICD-10-CM | POA: Insufficient documentation

## 2014-11-26 DIAGNOSIS — Z87828 Personal history of other (healed) physical injury and trauma: Secondary | ICD-10-CM | POA: Insufficient documentation

## 2014-11-26 DIAGNOSIS — R42 Dizziness and giddiness: Secondary | ICD-10-CM

## 2014-11-26 DIAGNOSIS — M7989 Other specified soft tissue disorders: Secondary | ICD-10-CM | POA: Insufficient documentation

## 2014-11-26 DIAGNOSIS — Z590 Homelessness: Secondary | ICD-10-CM | POA: Insufficient documentation

## 2014-11-26 DIAGNOSIS — Z79899 Other long term (current) drug therapy: Secondary | ICD-10-CM | POA: Insufficient documentation
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
## 2014-11-26 NOTE — Discharge Instructions (Signed)
Stay hydrated. Keep dry.   Follow up with your doctor.   Take your medicines as prescribed.   Return to ER if you have worse dizziness, forgetfulness, falls.    Emergency Department Resource Guide 1) Find a Doctor and Pay Out of Pocket Although you won't have to find out who is covered by your insurance plan, it is a good idea to ask around and get recommendations. You will then need to call the office and see if the doctor you have chosen will accept you as a new patient and what types of options they offer for patients who are self-pay. Some doctors offer discounts or will set up payment plans for their patients who do not have insurance, but you will need to ask so you aren't surprised when you get to your appointment.  2) Contact Your Local Health Department Not all health departments have doctors that can see patients for sick visits, but many do, so it is worth a call to see if yours does. If you don't know where your local health department is, you can check in your phone book. The CDC also has a tool to help you locate your state's health department, and many state websites also have listings of all of their local health departments.  3) Find a Walk-in Clinic If your illness is not likely to be very severe or complicated, you may want to try a walk in clinic. These are popping up all over the country in pharmacies, drugstores, and shopping centers. They're usually staffed by nurse practitioners or physician assistants that have been trained to treat common illnesses and complaints. They're usually fairly quick and inexpensive. However, if you have serious medical issues or chronic medical problems, these are probably not your best option.  No Primary Care Doctor: - Call Health Connect at  [phone] - they can help you locate a primary care doctor that  accepts your insurance, provides certain services, etc. - Physician Referral Service- [phone]  Chronic Pain  Problems: Organization         Address  Phone   Notes  [facility]  [phone] Patients need to be referred by their primary care doctor.   Medication Assistance: Organization         Address  Phone   Notes  [facility] [address] [place], [place] [address] [phone] --Must be a resident of [place] -- Must have NO insurance coverage whatsoever (no Medicaid/ Medicare, etc.) -- The pt. MUST have a primary care doctor that directs their care regularly and follows them in the community   MedAssist  [phone]   [employer]  [phone]    Agencies that provide inexpensive medical care: Organization         Address  Phone   Notes  [facility]  [phone]   [facility]    [phone]   [facility] [address] [place], [place] [address] [phone]   [facility] [address], [place] [phone]   [facility]    [phone]   [facility]    [phone]   [facility]  [address], [place] Phone:  [phone], Fax:  [phone] Hours of Operation:  9 am - 6 pm, M-F.  Also accepts Medicaid/Medicare and self-pay.  [facility]  [address], [place] Phone: [phone], Fax: [phone]. Hours of Operation:  8:30 am - 5:30 pm, M-F.  Also accepts Medicaid and self-pay.  °[facility] [place] [address], [place] Phone: [phone]   °[facility] [address], [place], [place] [phone], Ext. [phone] Mondays & Thursdays: 7-9 AM.  First 15 patients are seen on a first come, first serve basis. °  ° °Medicaid-accepting [place] Providers: ° °Organization         Address  Phone   Notes  °[facility] [address], [place] [phone] Also  accepts self-pay patients.  °[facility] [address], [place] ° [phone]   °[facility] [address], [place] [phone]   °[facility] [address] [place] [address], [place] [phone]   °Veita Bland [address], [place]  ° [phone] Only accepts [place] Access Medicaid patients after they have their name applied to their card.  ° °Self-Pay (no insurance) in [place]: ° °Organization         Address  Phone   Notes  °Sickle Cell Patients, [facility] [address], [place] [phone]   °[facility] [address], [place] [phone]   °[facility] ° [address], [place] [phone]   °[facility]/Dr. Osei-Bonsu ° [address] [place] [address], [place] or [address], [place] [phone] Phone number for both [place] and [place] locations is the same.  °[facility] [address], [place] [phone]   °[facility] [address] [place] [address], [place] or [address] [phone] °([phone]) [phone]   °[facility] [address], [place] [phone], phone; [phone], fax Sees patients 1st and 3rd Saturday of every month.  Must not qualify for public or private insurance (i.e. Medicaid, Medicare, [place] Health Choice, Veterans' Benefits) • Household income should be no more than 200% of the poverty level •The clinic cannot treat you if you are pregnant or think you are pregnant • Sexually transmitted diseases are not treated at the clinic.  ° ° °Dental Care: °Organization         Address  Phone  Notes  °[place] [facility] [address], [place] [phone]) [phone] Accepts children up to age 21 who are enrolled in Medicaid or [place] Health Choice; pregnant  women with a Medicaid card; and children who have applied for Medicaid or [place] Health Choice, but were declined, whose parents can pay a reduced fee at time of service.  °[place] [facility] [place]  [address], [place] [phone]) [phone] Accepts children up to age 21 who are enrolled in Medicaid or [place] Health Choice; pregnant women with a Medicaid card; and children who have applied for Medicaid or [place] Health Choice, but were declined, whose parents can pay a reduced fee at time of service.  °[facility] ° [address], [place] [phone] Patients are seen by appointment only. Walk-ins are not accepted. [facility] will see patients 18 years of age and older. °Monday - Tuesday (8am-5pm) °Most Wednesdays (8:30-5pm) °$30 per visit, cash only  °[facility]   [facility]  [address], [facility] [phone] Patients are seen by appointment only. Walk-ins are not accepted. [place] will see patients 37 years of age and older. One Wednesday Evening (Monthly: Volunteer Based).  $30 per visit, cash only  [place]  [phone] for adults; Children under age 77, call Graduate Pediatric Dentistry at [phone]. Children aged 79-14, please call [phone] to request a pediatric application.  Dental services are provided in all areas of dental care including fillings, crowns and bridges, complete and partial dentures, implants, gum treatment, root canals, and extractions. Preventive care is also provided. Treatment is provided to both adults and children. Patients are selected via a lottery and there is often a waiting list.   [facility] [address], [place]  [phone] [email or web address]   [facility] [address] [place], [place] [phone], Ext. [phone] Second and Fourth Thursday of each month, opens at 6:30 AM; Clinic ends at 9 AM.  Patients are  seen on a first-come first-served basis, and a limited number are seen during each clinic.   [facility]  [address] [place] D[place]er [place], [place] [phone]   Eligibility Requirements You must have lived in [place], [place], or [place] counties for at least the last three months.   You cannot be eligible for state or federal sponsored [employer], including [employer], [place], or [employer].   You generally cannot be eligible for healthcare insurance through your employer.    How to apply: Eligibility screenings are held every Tuesday and Wednesday afternoon from 1:00 pm until 4:00 pm. You do not need an appointment for the interview!  [facility] [address], [place], [place]   [place]  [place] Department  [place]  [phone]    Behavioral Health Resources in the Community: Intensive Outpatient Programs Organization         Address  Phone  Notes  [place]. [address] [place], [place] [phone]   [facility] [address], [place], [place]   [facility] [address] [place], [place]   [place] [address],  [place], [place] or [phone]   Substance Abuse Resources Organization         Address  Phone  Notes  [facility] Drug [facility]  [phone]   [employer]  [phone]   [facility] [place]   Chinita Pester  [phone]   Residential & Outpatient Substance Abuse Program  [phone]   Psychological Services Organization         Address  Phone  Notes  [facility] [place]  [place]  [phone]   [place] [address] [place] [phone] or [phone]    Mobile Crisis  Teams Organization         Address  Phone  Notes  [facility], Mobile Crisis Care Unit  [phone]   [facility]  [address] [place], [place]   [place] [address], [place] [phone]    Self-Help/Support Groups Organization         Address  Phone             Notes  Mental Health  [place] - variety of support groups  336- H3156881 Call for more information  Narcotics Anonymous (NA), [facility] [address], [employer] [place]  2 meetings at this location   Special educational needs teacher         Address  Phone  Notes  [facility] [place],    [place]  [phone]   [facility]  [address], [place] [address]1157, [place], [place]   [place], [place] [phone] Admissions: 8am-3pm M-F  Incentives Substance [place] [address],    [place], [place] [phone]   [facility] [address] [place], [place], [place]   [facility] [address],  [place], [place]   [facility] - Intensive Outpatient [address], [place], [place]   [facility]  [place], [place] [phone] or [phone]   [facility] ([facility]) [address] [place], [place] Accepts Medicaid  [facility] [address],  [place] [phone] Substance Abuse/Addiction Treatment   [facility] Organization         Address  Phone  Notes  [facility]  [phone]   Domenic Schwab, PhD [address] [place], [place]   [phone] or [phone]   [place], [place] [phone]   [facility] [address], [place], [place] [phone]  Insurance/Medicaid/sponsorship through [facility] Families [address], [address] [place]                                    [place], [place] [phone] [employer] [address], [place] [phone]    Dr. Adele Schilder  [phone]   [facility] Dept. 1) [address] [place] 2) [place] 3)  [place] [address], [place] [phone]  [phone]   [place] [phone] or [phone] (After Hours)

## 2014-12-05 ENCOUNTER — Emergency Department (HOSPITAL_COMMUNITY): Payer: No Typology Code available for payment source

## 2014-12-05 ENCOUNTER — Observation Stay (HOSPITAL_COMMUNITY)
Admission: EM | Admit: 2014-12-05 | Discharge: 2014-12-06 | Disposition: A | Discharge status: Home / Self Care | Payer: No Typology Code available for payment source | Attending: Family Medicine | Admitting: Family Medicine

## 2014-12-05 ENCOUNTER — Encounter (HOSPITAL_COMMUNITY): Payer: Self-pay | Admitting: Emergency Medicine

## 2014-12-05 DIAGNOSIS — I10 Essential (primary) hypertension: Secondary | ICD-10-CM | POA: Diagnosis present

## 2014-12-05 DIAGNOSIS — G4733 Obstructive sleep apnea (adult) (pediatric): Secondary | ICD-10-CM | POA: Insufficient documentation

## 2014-12-05 DIAGNOSIS — D649 Anemia, unspecified: Secondary | ICD-10-CM | POA: Insufficient documentation

## 2014-12-05 DIAGNOSIS — F1210 Cannabis abuse, uncomplicated: Secondary | ICD-10-CM | POA: Insufficient documentation

## 2014-12-05 DIAGNOSIS — R4182 Altered mental status, unspecified: Principal | ICD-10-CM

## 2014-12-05 DIAGNOSIS — Z590 Homelessness: Secondary | ICD-10-CM | POA: Insufficient documentation

## 2014-12-05 DIAGNOSIS — E785 Hyperlipidemia, unspecified: Secondary | ICD-10-CM | POA: Diagnosis present

## 2014-12-05 DIAGNOSIS — F17210 Nicotine dependence, cigarettes, uncomplicated: Secondary | ICD-10-CM | POA: Insufficient documentation

## 2014-12-05 LAB — BLOOD GAS, VENOUS
Acid-Base Excess: 2.3 mmol/L — ABNORMAL HIGH (ref 0.0–2.0)
BICARBONATE: 25.6 meq/L — AB (ref 20.0–24.0)
O2 Saturation: 94.6 %
Patient temperature: 98.6
TCO2: 22.8 mmol/L (ref 0–100)
pCO2, Ven: 36.8 mmHg — ABNORMAL LOW (ref 45.0–50.0)
pH, Ven: 7.457 — ABNORMAL HIGH (ref 7.250–7.300)
pO2, Ven: 70.6 mmHg — ABNORMAL HIGH (ref 30.0–45.0)

## 2014-12-05 LAB — URINALYSIS, ROUTINE W REFLEX MICROSCOPIC
BILIRUBIN URINE: NEGATIVE
GLUCOSE, UA: NEGATIVE mg/dL
HGB URINE DIPSTICK: NEGATIVE
Ketones, ur: 15 mg/dL — AB
LEUKOCYTES UA: NEGATIVE
Nitrite: NEGATIVE
Protein, ur: NEGATIVE mg/dL
SPECIFIC GRAVITY, URINE: 1.015 (ref 1.005–1.030)
Urobilinogen, UA: 2 mg/dL — ABNORMAL HIGH (ref 0.0–1.0)
pH: 6.5 (ref 5.0–8.0)

## 2014-12-05 LAB — CBC WITH DIFFERENTIAL/PLATELET
BASOS PCT: 0 % (ref 0–1)
Basophils Absolute: 0 10*3/uL (ref 0.0–0.1)
Eosinophils Absolute: 0.1 10*3/uL (ref 0.0–0.7)
Eosinophils Relative: 1 % (ref 0–5)
HCT: 37.2 % — ABNORMAL LOW (ref 39.0–52.0)
Hemoglobin: 12.4 g/dL — ABNORMAL LOW (ref 13.0–17.0)
LYMPHS ABS: 1.6 10*3/uL (ref 0.7–4.0)
Lymphocytes Relative: 17 % (ref 12–46)
MCH: 29 pg (ref 26.0–34.0)
MCHC: 33.3 g/dL (ref 30.0–36.0)
MCV: 87.1 fL (ref 78.0–100.0)
MONO ABS: 0.9 10*3/uL (ref 0.1–1.0)
MONOS PCT: 9 % (ref 3–12)
NEUTROS PCT: 73 % (ref 43–77)
Neutro Abs: 6.8 10*3/uL (ref 1.7–7.7)
PLATELETS: 309 10*3/uL (ref 150–400)
RBC: 4.27 MIL/uL (ref 4.22–5.81)
RDW: 13.9 % (ref 11.5–15.5)
WBC: 9.4 10*3/uL (ref 4.0–10.5)

## 2014-12-05 LAB — BASIC METABOLIC PANEL
ANION GAP: 8 (ref 5–15)
BUN: 10 mg/dL (ref 6–20)
CHLORIDE: 103 mmol/L (ref 101–111)
CO2: 26 mmol/L (ref 22–32)
CREATININE: 0.78 mg/dL (ref 0.61–1.24)
Calcium: 8.3 mg/dL — ABNORMAL LOW (ref 8.9–10.3)
GFR calc Af Amer: >60 mL/min (ref >60)
Glucose, Bld: 94 mg/dL (ref 65–99)
Potassium: 3.6 mmol/L (ref 3.5–5.1)
Sodium: 137 mmol/L (ref 135–145)

## 2014-12-05 LAB — RAPID URINE DRUG SCREEN, HOSP PERFORMED
Amphetamines: NOT DETECTED
BARBITURATES: NOT DETECTED
Benzodiazepines: NOT DETECTED
COCAINE: NOT DETECTED
OPIATES: NOT DETECTED
TETRAHYDROCANNABINOL: POSITIVE — AB

## 2014-12-05 LAB — ETHANOL: Alcohol, Ethyl (B): <5 mg/dL (ref <5)

## 2014-12-05 LAB — I-STAT TROPONIN, ED: TROPONIN I, POC: 0 ng/mL (ref 0.00–0.08)

## 2014-12-05 LAB — AMMONIA: AMMONIA: 27 umol/L (ref 9–35)

## 2014-12-05 LAB — I-STAT CG4 LACTIC ACID, ED: Lactic Acid, Venous: 0.74 mmol/L (ref 0.5–2.0)

## 2014-12-05 MED ORDER — SODIUM CHLORIDE 0.9 % IV BOLUS (SEPSIS)
1000.0000 mL | Freq: Once | INTRAVENOUS | Status: AC
  Administered 2014-12-05: 1000 mL via INTRAVENOUS

## 2014-12-05 MED ORDER — ACETAMINOPHEN 650 MG RE SUPP
RECTAL | Status: AC
  Administered 2014-12-05: 650 mg via RECTAL
  Filled 2014-12-05: qty 1

## 2014-12-05 MED ORDER — ACETAMINOPHEN 650 MG RE SUPP
650.0000 mg | Freq: Once | RECTAL | Status: AC
  Administered 2014-12-05: 650 mg via RECTAL

## 2014-12-05 MED ORDER — ACETAMINOPHEN 325 MG PO TABS: 650.0000 mg | ORAL_TABLET | Freq: Once | ORAL | Status: DC

## 2014-12-05 MED ORDER — SODIUM CHLORIDE 0.9 % IV BOLUS (SEPSIS)
1000.0000 mL | Freq: Once | INTRAVENOUS | Status: AC
Start: 2014-12-05 — End: 2014-12-05
  Administered 2014-12-05: 1000 mL via INTRAVENOUS

## 2014-12-05 NOTE — ED Notes (Signed)
Patient from community resource center with complaints of multiple falls today, weakness, fatigue. Head injury, abrasions. Hx narcolepsy.

## 2014-12-05 NOTE — ED Notes (Signed)
No changes. Pt sleeping with sonorous resps, e/u, no dyspnea noted, arousable to voice, not interactive or verbal, follows some commands, VSS. IVF bolus complete, preparing to do I&O for urine.
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
## 2014-12-05 NOTE — ED Notes (Signed)
Bed: WA03 Expected date:  Expected time:  Means of arrival:  Comments: EMS-narcolepsy

## 2014-12-06 DIAGNOSIS — I890 Lymphedema, not elsewhere classified: Secondary | ICD-10-CM

## 2014-12-06 DIAGNOSIS — I10 Essential (primary) hypertension: Secondary | ICD-10-CM

## 2014-12-06 DIAGNOSIS — G4733 Obstructive sleep apnea (adult) (pediatric): Secondary | ICD-10-CM

## 2014-12-06 DIAGNOSIS — G92 Toxic encephalopathy: Secondary | ICD-10-CM

## 2014-12-06 LAB — CBC
HEMATOCRIT: 35 % — AB (ref 39.0–52.0)
HEMATOCRIT: 35.1 % — AB (ref 39.0–52.0)
HEMOGLOBIN: 11.5 g/dL — AB (ref 13.0–17.0)
Hemoglobin: 11.6 g/dL — ABNORMAL LOW (ref 13.0–17.0)
MCH: 28.3 pg (ref 26.0–34.0)
MCH: 28.6 pg (ref 26.0–34.0)
MCHC: 32.9 g/dL (ref 30.0–36.0)
MCHC: 33 g/dL (ref 30.0–36.0)
MCV: 86.2 fL (ref 78.0–100.0)
MCV: 86.7 fL (ref 78.0–100.0)
Platelets: 271 10*3/uL (ref 150–400)
Platelets: 281 10*3/uL (ref 150–400)
RBC: 4.05 MIL/uL — ABNORMAL LOW (ref 4.22–5.81)
RBC: 4.06 MIL/uL — ABNORMAL LOW (ref 4.22–5.81)
RDW: 13.8 % (ref 11.5–15.5)
RDW: 13.8 % (ref 11.5–15.5)
WBC: 6 10*3/uL (ref 4.0–10.5)
WBC: 7.4 10*3/uL (ref 4.0–10.5)

## 2014-12-06 LAB — COMPREHENSIVE METABOLIC PANEL
ALT: 16 U/L — ABNORMAL LOW (ref 17–63)
ANION GAP: 4 — AB (ref 5–15)
AST: 15 U/L (ref 15–41)
Albumin: 2.9 g/dL — ABNORMAL LOW (ref 3.5–5.0)
Alkaline Phosphatase: 65 U/L (ref 38–126)
BUN: 6 mg/dL (ref 6–20)
CO2: 25 mmol/L (ref 22–32)
CREATININE: 0.65 mg/dL (ref 0.61–1.24)
Calcium: 7.6 mg/dL — ABNORMAL LOW (ref 8.9–10.3)
Chloride: 107 mmol/L (ref 101–111)
GFR calc non Af Amer: >60 mL/min (ref >60)
Glucose, Bld: 87 mg/dL (ref 65–99)
Potassium: 3.6 mmol/L (ref 3.5–5.1)
SODIUM: 136 mmol/L (ref 135–145)
Total Bilirubin: 1 mg/dL (ref 0.3–1.2)
Total Protein: 6.3 g/dL — ABNORMAL LOW (ref 6.5–8.1)

## 2014-12-06 LAB — I-STAT CG4 LACTIC ACID, ED: Lactic Acid, Venous: 0.46 mmol/L — ABNORMAL LOW (ref 0.5–2.0)

## 2014-12-06 LAB — CREATININE, SERUM
Creatinine, Ser: 0.81 mg/dL (ref 0.61–1.24)
GFR calc Af Amer: >60 mL/min (ref >60)
GFR calc non Af Amer: >60 mL/min (ref >60)

## 2014-12-06 LAB — TROPONIN I
Troponin I: <0.03 ng/mL (ref <0.031)
Troponin I: <0.03 ng/mL (ref <0.031)
Troponin I: <0.03 ng/mL (ref <0.031)

## 2014-12-06 LAB — GLUCOSE, CAPILLARY: GLUCOSE-CAPILLARY: 87 mg/dL (ref 65–99)

## 2014-12-06 MED ORDER — ONDANSETRON HCL 4 MG PO TABS: 4.0000 mg | ORAL_TABLET | Freq: Four times a day (QID) | ORAL | Status: DC | PRN

## 2014-12-06 MED ORDER — CARVEDILOL 12.5 MG PO TABS
12.5000 mg | ORAL_TABLET | Freq: Two times a day (BID) | ORAL | Status: DC
  Administered 2014-12-06 (×2): 12.5 mg via ORAL
  Filled 2014-12-06 (×3): qty 1

## 2014-12-06 MED ORDER — ACETAMINOPHEN 650 MG RE SUPP: 650.0000 mg | Freq: Four times a day (QID) | RECTAL | Status: DC | PRN

## 2014-12-06 MED ORDER — HEPARIN SODIUM (PORCINE) 5000 UNIT/ML IJ SOLN
5000.0000 [IU] | Freq: Three times a day (TID) | INTRAMUSCULAR | Status: DC
  Administered 2014-12-06 (×3): 5000 [IU] via SUBCUTANEOUS
  Filled 2014-12-06 (×5): qty 1

## 2014-12-06 MED ORDER — ACETAMINOPHEN 325 MG PO TABS: 650.0000 mg | ORAL_TABLET | Freq: Four times a day (QID) | ORAL | Status: DC | PRN

## 2014-12-06 MED ORDER — SODIUM CHLORIDE 0.9 % IJ SOLN: 3.0000 mL | Freq: Two times a day (BID) | INTRAMUSCULAR | Status: DC

## 2014-12-06 MED ORDER — SODIUM CHLORIDE 0.9 % IV SOLN
INTRAVENOUS | Status: DC
  Administered 2014-12-06: 11:00:00 via INTRAVENOUS
  Administered 2014-12-06: 100 mL/h via INTRAVENOUS

## 2014-12-06 MED ORDER — VITAMIN B-1 100 MG PO TABS
100.0000 mg | ORAL_TABLET | Freq: Every day | ORAL | Status: DC
  Administered 2014-12-06: 100 mg via ORAL
  Filled 2014-12-06: qty 1

## 2014-12-06 MED ORDER — CARVEDILOL 6.25 MG PO TABS: 6.2500 mg | ORAL_TABLET | Freq: Two times a day (BID) | ORAL | Status: DC

## 2014-12-06 MED ORDER — ONDANSETRON HCL 4 MG/2ML IJ SOLN: 4.0000 mg | Freq: Four times a day (QID) | INTRAMUSCULAR | Status: DC | PRN

## 2014-12-06 MED ORDER — ADULT MULTIVITAMIN W/MINERALS CH
1.0000 | ORAL_TABLET | Freq: Every day | ORAL | Status: DC
  Administered 2014-12-06: 1 via ORAL
  Filled 2014-12-06: qty 1

## 2014-12-06 MED ORDER — FOLIC ACID 1 MG PO TABS
1.0000 mg | ORAL_TABLET | Freq: Every day | ORAL | Status: DC
  Administered 2014-12-06: 1 mg via ORAL
  Filled 2014-12-06: qty 1

## 2014-12-06 NOTE — Clinical Social Work Note (Signed)
Clinical Social Work Assessment  Patient Details  Name: [name] MRN: [id number] Date of Birth: 05-27-1965  Date of referral:  12/06/14               Reason for consult:  Housing Concerns/Homelessness                Permission sought to share information with:    Permission granted to share information::  No  Name::        Agency::     Relationship::     Contact Information:     Housing/Transportation Living arrangements for the past 2 months:  No permanent address Source of Information:  Patient Patient Interpreter Needed:  None Criminal Activity/Legal Involvement Pertinent to Current Situation/Hospitalization:  No - Comment as needed Significant Relationships:  None Lives with:  Self Do you feel safe going back to the place where you live?  Yes Need for family participation in patient care:  No (Coment)  Care giving concerns:  Patient has been homeless for 4 years and plans to return back to homeless community where he stays with a few friends.   Social Worker assessment / plan:  [name] received referral due to patient being homeless. [name] reviewed chart and met with patient at bedside. [name] introduced myself and explained role.  Patient reports that he used to work in [employer] but started having medical problems and had to quit working. Patient did not have any income or support and became homeless. Patient has applied for disability but was denied and hired a Chief Executive Officer to appeal disability. Patient reports he is very engaged at the [facility]) and goes there to get medical attention and get medications filled. Patient reports [facility] will help him with housing once his disability is approved. [name] spoke with patient re: local shelters and patient reports he will never stay at a shelter. Patient reports he would prefer to stay outside with this friends.  Patient reports no needs from [name] and that he is resourceful in the community. Patient requested bus pass  at [place] has bus pass when patient ready to DC.  [name] is signing off but available if needed.  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  Patient/Family's Understanding of and Emotional Response to Diagnosis, Current Treatment, and Prognosis:  Patient reports he is frustrated that his disability has been denied and feels if he had stable housing then he would not be hospitalized as frequently. Patient reports he has several medical problems but feels they are related to homelessness. Patient reports he is compliant with medications and follows up at the [facility] when he has medical problems.  Emotional Assessment Appearance:  Appears stated age Attitude/Demeanor/Rapport:  Other (Cooperative) Affect (typically observed):  Appropriate Orientation:  Oriented to Self, Oriented to Place, Oriented to  Time, Oriented to Situation Alcohol / Substance use:  Not Applicable (Patient denies any substance use) Psych involvement (Current and /or in the community):  No (Comment)  Discharge Needs  Concerns to be addressed:  No discharge needs identified Readmission within the last 30 days:  No Current discharge risk:  Homeless Barriers to Discharge:  No Barriers Identified   Boone Master, [place] 12/06/2014, 11:37 AM  [phone] 

## 2014-12-06 NOTE — Discharge Summary (Signed)
Physician Discharge Summary  [name] ZOX:[id number] DOB: April 11, 1966 DOA: 12/05/2014  PCP: No primary care provider on file.  Admit date: 12/05/2014 Discharge date: 12/06/2014  Time spent: 25 minutes  Recommendations for Outpatient Follow-up:  1. Needs Recheck lytes 1 week 2. No LASIX if not drinking water 3. Cut back coreg dosing this admit 4. Social worker has seen for Homelessness state-unlikely that this will change overall prognosis as non-comopliant on CPAP and high re-admission/hi risk patient for multiple episodic hospital care    Discharge Diagnoses:  Principal Problem:   Altered mental status Active Problems:   Dyslipidemia   Essential hypertension   Discharge Condition: gaurded  Diet recommendation:  reg  Filed Weights   12/06/14 0032  Weight: 122.335 kg (269 lb 11.2 oz)    History of present illness:  49 y/o homeless htn/chr Lymphedema, OSA NOT on bipap, Prior Substance abuse admitted 12/05/14 with Toxic metabolic encephalopathy vs Heat exhaustion [unclear diagnosis on d/c] Given IV saline and awoke eventually Noted UDS + for Marijuana Patient counselled re: homelessness, Marijuana and dosing changes of meds Stable for d/c on 7/26 with no furthe rin-patient care required    Discharge Exam: Filed Vitals:   12/06/14 0747  BP: 152/86  Pulse:   Temp:   Resp:     General: eomi, ncat Cardiovascular: s1 s2 no m/r/g Respiratory: clear no added sound  Discharge Instructions   Discharge Instructions    Diet - low sodium heart healthy    Complete by:  As directed      Discharge instructions    Complete by:  As directed   DO NOT TAKE LASIX IF YOU HAVE NOT HAD ENOUGH TO DRINK  If you are on the road I have cut back your Coreg Please follow up with [facility] and get some labs done in about 1-2 weeks if you can I would suggest you try to find a place to stay prior to winter and hopefully you can get some help finding something to earn you money      Increase activity slowly    Complete by:  As directed           Current Discharge Medication List    CONTINUE these medications which have CHANGED   Details  carvedilol (COREG) 6.25 MG tablet Take 1 tablet (6.25 mg total) by mouth 2 (two) times daily with a meal. Qty: 60 tablet, Refills: 0      CONTINUE these medications which have NOT CHANGED   Details  furosemide (LASIX) 40 MG tablet Take 1 tablet (40 mg total) by mouth 2 (two) times daily. Qty: 14 tablet, Refills: 0    potassium chloride SA (K-DUR,KLOR-CON) 20 MEQ tablet Take 1 tablet (20 mEq total) by mouth daily. Qty: 14 tablet, Refills: 0       No Known Allergies    The results of significant diagnostics from this hospitalization (including imaging, microbiology, ancillary and laboratory) are listed below for reference.    Significant Diagnostic Studies: Ct Head Wo Contrast  12/05/2014   CLINICAL DATA:  Altered mental status. Syncope. Fainting spells. Patient is a pleasant homeless 49 year old male presenting with desired have an a nice cool place to rest. Patient presented to EMS and said that he was having "falling out spells" now states that he does not have any symptoms and just wishes to have "a nice cool place to rest. " Patient appears intoxicated. Has a Band-Aid over his right head with an abrasion that appears old. Pt  currently altered.  EXAM: CT HEAD WITHOUT CONTRAST  TECHNIQUE: Contiguous axial images were obtained from the base of the skull through the vertex without intravenous contrast.  COMPARISON:  None.  FINDINGS: No mass lesion, mass effect, midline shift, hydrocephalus, hemorrhage. No territorial ischemia or acute infarction. Calvarium intact. Visible paranasal sinuses are normal.  IMPRESSION: Negative CT head.   Electronically Signed   By: Andreas Newport M.D.   On: 12/05/2014 19:16    Microbiology: No results found for this or any previous visit (from the past 240 hour(s)).   Labs: Basic  Metabolic Panel:  Recent Labs Lab 12/05/14 1920 12/06/14 0050 12/06/14 0720  NA 137  --  136  K 3.6  --  3.6  CL 103  --  107  CO2 26  --  25  GLUCOSE 94  --  87  BUN 10  --  6  CREATININE 0.78 0.81 0.65  CALCIUM 8.3*  --  7.6*   Liver Function Tests:  Recent Labs Lab 12/06/14 0720  AST 15  ALT 16*  ALKPHOS 65  BILITOT 1.0  PROT 6.3*  ALBUMIN 2.9*   No results for input(s): LIPASE, AMYLASE in the last 168 hours.  Recent Labs Lab 12/05/14 2052  AMMONIA 27   CBC:  Recent Labs Lab 12/05/14 1920 12/06/14 0050 12/06/14 0720  WBC 9.4 7.4 6.0  NEUTROABS 6.8  --   --   HGB 12.4* 11.6* 11.5*  HCT 37.2* 35.1* 35.0*  MCV 87.1 86.7 86.2  PLT 309 271 281   Cardiac Enzymes:  Recent Labs Lab 12/06/14 0050 12/06/14 0720  TROPONINI <0.03 <0.03   BNP: BNP (last 3 results)  Recent Labs  08/27/14 1800  BNP 34.2    ProBNP (last 3 results) No results for input(s): PROBNP in the last 8760 hours.  CBG:  Recent Labs Lab 12/06/14 0723  GLUCAP 87       Signed:  Rhetta Mura  [facility] 12/06/2014, 11:16 AM

## 2014-12-06 NOTE — ED Notes (Signed)
IVF bolus continues (2nd liter), pt sleeping arousable to voice (improved), verbal with some comprehensible words (improved), follows commands, poor concentration, falls back to sleep. VSS, pt updated.

## 2014-12-06 NOTE — Progress Notes (Signed)
Clinical Social Work  PT evaluated patient and recommended SNF placement. AD on unit worked with patient as well who was able to stand up but had poor balance. [name] spoke with assistant director Timothy Lasso [place]) who reports patient would be eligible for LOG in [facility] for 30 days if patient agreeable. [name] spoke with patient who reports he would prefer to stay in [place] and politely declined LOG. [name] made MD aware who reports patient can DC today.  [name] is signing off but available if needed.  [place], [place] [phone]

## 2014-12-06 NOTE — Evaluation (Signed)
Physical Therapy Evaluation Patient Details Name: [name] MRN: [id number] DOB: 16-Jun-1965 Today's Date: 12/06/2014   History of Present Illness  49 y.o. male with history of HTN, substance use, leg lymphedema presents with altered mental status  Clinical Impression  Pt admitted with above diagnosis. Pt currently with functional limitations due to the deficits listed below (see PT Problem List).  Pt will benefit from skilled PT to increase their independence and safety with mobility to allow discharge to the venue listed below.  Pt required assist for bed mobility, presents with poor control of movement, and unable to stand today.  Pt also with hx of falls and homeless.  If pt unable to d/c to SNF, would benefit from w/c to assist with safe mobility     Follow Up Recommendations SNF    Equipment Recommendations  Wheelchair (measurements PT)    Recommendations for Other Services       Precautions / Restrictions Precautions Precautions: Fall      Mobility  Bed Mobility Overal bed mobility: Needs Assistance Bed Mobility: Supine to Sit;Sit to Supine;Rolling Rolling: Modified independent (Device/Increase time) (with rails)   Supine to sit: Min assist;+2 for safety/equipment Sit to supine: Mod assist;+2 for safety/equipment   General bed mobility comments: assist for trunk support upright and LEs onto bed, pt with decreased control of movements, attempted to scoot hips up toward [facility] and flailing extremities, assist required for repositioning in supine, very poor control of movement, safety cues for pt attempting to sit up again after return to supine to use urinal so he would not fall out of bed  Transfers Overall transfer level:  (pt felt unable to stand, even with +2 assist available)                  Ambulation/Gait                Stairs            Wheelchair Mobility    Modified Rankin (Stroke Patients Only)       Balance Overall balance  assessment: Needs assistance Sitting-balance support: Single extremity supported;Feet supported Sitting balance-Leahy Scale: Fair Sitting balance - Comments: cannot tolerate challenges, required trunk assist for using urinal EOB                                     Pertinent Vitals/Pain Pain Assessment: No/denies pain    Home Living Family/patient expects to be discharged to:: Shelter/Homeless                      Prior Function Level of Independence: Independent with assistive device(s)         Comments: has quad cane with missing rubber stoppers and RW in room     Hand Dominance        Extremity/Trunk Assessment   Upper Extremity Assessment: Generalized weakness           Lower Extremity Assessment: Generalized weakness (bil LE lymphedema, only able to perform 2+/5 grossly)         Communication   Communication: No difficulties  Cognition Arousal/Alertness: Awake/alert Behavior During Therapy: WFL for tasks assessed/performed Overall Cognitive Status: No family/caregiver present to determine baseline cognitive functioning Area of Impairment: Safety/judgement         Safety/Judgement: Decreased awareness of safety          General Comments  Exercises        Assessment/Plan    PT Assessment Patient needs continued PT services  PT Diagnosis Difficulty walking;Generalized weakness   PT Problem List Decreased strength;Decreased activity tolerance;Decreased balance;Decreased mobility;Decreased safety awareness;Decreased coordination  PT Treatment Interventions DME instruction;Gait training;Functional mobility training;Patient/family education;Therapeutic activities;Wheelchair mobility training;Therapeutic exercise;Balance training   PT Goals (Current goals can be found in the Care Plan section) Acute Rehab PT Goals PT Goal Formulation: With patient Time For Goal Achievement: 12/13/14 Potential to Achieve Goals: Good     Frequency Min 3X/week   Barriers to discharge        Co-evaluation               End of Session   Activity Tolerance: Patient limited by fatigue Patient left: in bed;with call bell/phone within reach;with bed alarm set Nurse Communication: Mobility status    Functional Assessment Tool Used: clinical judgement Functional Limitation: Mobility: Walking and moving around Mobility: Walking and Moving Around Current Status (N8295): At least 40 percent but less than 60 percent impaired, limited or restricted Mobility: Walking and Moving Around Goal Status [phone]): At least 1 percent but less than 20 percent impaired, limited or restricted    Time: 1156-1212 PT Time Calculation (min) (ACUTE ONLY): 16 min   Charges:   PT Evaluation $Initial PT Evaluation Tier I: 1 Procedure     PT G Codes:   PT G-Codes **NOT FOR INPATIENT CLASS** Functional Assessment Tool Used: clinical judgement Functional Limitation: Mobility: Walking and moving around Mobility: Walking and Moving Around Current Status (Q6578): At least 40 percent but less than 60 percent impaired, limited or restricted Mobility: Walking and Moving Around Goal Status [phone]): At least 1 percent but less than 20 percent impaired, limited or restricted    Travonta Gill,KATHrine E 12/06/2014, 12:53 PM Zenovia Jarred, PT, DPT 12/06/2014 Pager: [phone]

## 2014-12-10 ENCOUNTER — Emergency Department (HOSPITAL_COMMUNITY)
Admission: EM | Admit: 2014-12-10 | Discharge: 2014-12-10 | Disposition: A | Discharge status: Home / Self Care | Payer: No Typology Code available for payment source | Attending: Emergency Medicine | Admitting: Emergency Medicine

## 2014-12-10 ENCOUNTER — Encounter (HOSPITAL_COMMUNITY): Payer: Self-pay | Admitting: Emergency Medicine

## 2014-12-10 ENCOUNTER — Encounter (HOSPITAL_COMMUNITY): Payer: Self-pay | Admitting: Oncology

## 2014-12-10 ENCOUNTER — Emergency Department (HOSPITAL_COMMUNITY)
Admission: EM | Admit: 2014-12-10 | Discharge: 2014-12-12 | Disposition: A | Discharge status: Home / Self Care | Payer: No Typology Code available for payment source | Attending: Emergency Medicine | Admitting: Emergency Medicine

## 2014-12-10 DIAGNOSIS — Z79899 Other long term (current) drug therapy: Secondary | ICD-10-CM | POA: Insufficient documentation

## 2014-12-10 DIAGNOSIS — Y939 Activity, unspecified: Secondary | ICD-10-CM | POA: Insufficient documentation

## 2014-12-10 DIAGNOSIS — M79605 Pain in left leg: Secondary | ICD-10-CM | POA: Insufficient documentation

## 2014-12-10 DIAGNOSIS — W1839XA Other fall on same level, initial encounter: Secondary | ICD-10-CM | POA: Insufficient documentation

## 2014-12-10 DIAGNOSIS — Y929 Unspecified place or not applicable: Secondary | ICD-10-CM | POA: Insufficient documentation

## 2014-12-10 DIAGNOSIS — I10 Essential (primary) hypertension: Secondary | ICD-10-CM | POA: Insufficient documentation

## 2014-12-10 DIAGNOSIS — Y998 Other external cause status: Secondary | ICD-10-CM | POA: Insufficient documentation

## 2014-12-10 DIAGNOSIS — Y999 Unspecified external cause status: Secondary | ICD-10-CM | POA: Insufficient documentation

## 2014-12-10 DIAGNOSIS — S8991XA Unspecified injury of right lower leg, initial encounter: Secondary | ICD-10-CM | POA: Insufficient documentation

## 2014-12-10 DIAGNOSIS — Z720 Tobacco use: Secondary | ICD-10-CM | POA: Insufficient documentation

## 2014-12-10 DIAGNOSIS — M79604 Pain in right leg: Secondary | ICD-10-CM | POA: Insufficient documentation

## 2014-12-10 DIAGNOSIS — R410 Disorientation, unspecified: Secondary | ICD-10-CM | POA: Insufficient documentation

## 2014-12-10 DIAGNOSIS — W19XXXA Unspecified fall, initial encounter: Secondary | ICD-10-CM

## 2014-12-10 DIAGNOSIS — S0081XA Abrasion of other part of head, initial encounter: Secondary | ICD-10-CM | POA: Insufficient documentation

## 2014-12-10 DIAGNOSIS — Y9389 Activity, other specified: Secondary | ICD-10-CM | POA: Insufficient documentation

## 2014-12-10 DIAGNOSIS — R062 Wheezing: Secondary | ICD-10-CM | POA: Insufficient documentation

## 2014-12-10 DIAGNOSIS — Z87828 Personal history of other (healed) physical injury and trauma: Secondary | ICD-10-CM | POA: Insufficient documentation

## 2014-12-10 DIAGNOSIS — M79606 Pain in leg, unspecified: Secondary | ICD-10-CM

## 2014-12-10 DIAGNOSIS — Y9289 Other specified places as the place of occurrence of the external cause: Secondary | ICD-10-CM | POA: Insufficient documentation

## 2014-12-10 DIAGNOSIS — X58XXXA Exposure to other specified factors, initial encounter: Secondary | ICD-10-CM | POA: Insufficient documentation

## 2014-12-10 DIAGNOSIS — S8992XA Unspecified injury of left lower leg, initial encounter: Secondary | ICD-10-CM | POA: Insufficient documentation

## 2014-12-10 MED ORDER — IPRATROPIUM BROMIDE 0.02 % IN SOLN
0.5000 mg | Freq: Once | RESPIRATORY_TRACT | Status: AC
  Administered 2014-12-11: 0.5 mg via RESPIRATORY_TRACT
  Filled 2014-12-10: qty 2.5

## 2014-12-10 MED ORDER — ALBUTEROL SULFATE (2.5 MG/3ML) 0.083% IN NEBU
5.0000 mg | INHALATION_SOLUTION | Freq: Once | RESPIRATORY_TRACT | Status: AC
  Administered 2014-12-11: 5 mg via RESPIRATORY_TRACT
  Filled 2014-12-10: qty 6

## 2014-12-10 NOTE — ED Notes (Signed)
Per EMS pt presents d/t 2 years of BLE edema.  Pt is A&O x 4.  Pt seen here earlier today.

## 2014-12-10 NOTE — Discharge Instructions (Signed)
As discussed, your evaluation today has been largely reassuring.  But, it is important that you monitor your condition carefully, and do not hesitate to return to the ED if you develop new, or concerning changes in your condition. ? ?Otherwise, please follow-up with your physician for appropriate ongoing care. ? ?

## 2014-12-10 NOTE — ED Notes (Signed)
Bed: GN56 Expected date: 12/10/14 Expected time: 12:35 PM Means of arrival:  Comments: Leg pain
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
## 2014-12-10 NOTE — ED Notes (Signed)
Homeless shelter resident with bilateral lower leg swelling.  Patient does not report any pain at this time.  EMS reports patient in and out of sleep the whole way to the hospital.  Not taking any meds now.  History of hypertension and CHF and sleep apnea.

## 2014-12-10 NOTE — ED Notes (Signed)
Bed: ZO10 Expected date:  Expected time:  Means of arrival:  Comments: EMS 42M BLE edema

## 2014-12-11 MED ORDER — POTASSIUM CHLORIDE CRYS ER 20 MEQ PO TBCR
20.0000 meq | EXTENDED_RELEASE_TABLET | Freq: Every day | ORAL | Status: DC
  Administered 2014-12-11 – 2014-12-12 (×2): 20 meq via ORAL
  Filled 2014-12-11 (×2): qty 1

## 2014-12-11 MED ORDER — FUROSEMIDE 40 MG PO TABS
40.0000 mg | ORAL_TABLET | Freq: Two times a day (BID) | ORAL | Status: DC
  Administered 2014-12-11 – 2014-12-12 (×2): 40 mg via ORAL
  Filled 2014-12-11 (×2): qty 1

## 2014-12-11 MED ORDER — CARVEDILOL 6.25 MG PO TABS
6.2500 mg | ORAL_TABLET | Freq: Two times a day (BID) | ORAL | Status: DC
  Administered 2014-12-11 – 2014-12-12 (×2): 6.25 mg via ORAL
  Filled 2014-12-11 (×5): qty 1

## 2014-12-11 NOTE — Discharge Instructions (Signed)
Recheck as needed. Be careful having things hanging from your walker. It can make it unstable.

## 2014-12-11 NOTE — Progress Notes (Addendum)
2:17pm. [name] spoke with Broc at [facility] earlier this AM. They are interested in accepting pt into their SNF for 30 day stay; however, they must do in-person assessment of pt. They will come out to assess pt between 8am-noon tomorrow.   Pt's pasarr number is [phone] [id number]. Dr. Audie Pinto signed FL-2. Clinicals sent out over carefinder pro. [name] met with pt and explained SNF process. Pt aware he will be interviewed tomorrow. Pt aware that if he is not accepted to [facility] he will be d/c. [name] provided supportive counseling and answered pt questions.   [place] Worker [facility] phone: [phone]

## 2014-12-11 NOTE — ED Provider Notes (Signed)
Patient was seen by Dr. Lynelle Doctor but was homeless and has nowhere to go.  He was in our waiting room.  They tried to get him on a bus and he fell.  The social worker said if we can board him until tomorrow she thinks I can get more permanent placement for him.  He was moved to TCU area where he will be boarded and tell evaluation by social services tomorrow.  Nelva Nay, MD 12/11/14 [phone]

## 2014-12-11 NOTE — ED Notes (Signed)
Bed: WA27 Expected date:  Expected time:  Means of arrival:  Comments: 
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
## 2014-12-11 NOTE — ED Notes (Signed)
Patient initially refused to leave room stated he was unable to walk. Patient was asked about his prior discharge and his mobility at home. Security was called to bedside who assisted with patient standing and escorted him to lobby. Patient was advised he may remain in the lobby as long as he does not cause a disruption.

## 2014-12-12 NOTE — Progress Notes (Signed)
Clinical Social Work  [name] spoke with Secondary school teacher at [facility] who plans to come and visit patient this afternoon to decide if they can accept him.  Unk Lightning, LCSW (Coverage for [employer])

## 2014-12-12 NOTE — Progress Notes (Signed)
Pt informed ED SW who mentioned it to ED Cm that pt has an orange card

## 2014-12-12 NOTE — Progress Notes (Signed)
[name] spoke with Mal Amabile of [facility]. He states that the patient has been accepted to their facility and that there are no needs from [name] at this time.  [name] spoke with patient at bedside, who confirms that he does not have Medicaid. [name] and Nurse CM spoke with patient at bedside regarding the benefits of Medicaid. Patient gave [name] permission to reach out to his father in regards to helping him apply for Medicaid. However, the patient's father did not answer the phone.  Patient states that he does not have any questions for [name] at this time.  Trish Mage [phone] ED [name] 12/12/2014 6:06 PM

## 2014-12-12 NOTE — Progress Notes (Addendum)
Pt is very pleasant and appreciative of everything being done for him.pt ate 100% of his breakfast. He states he has been homeless times 7 year. Pt is unsteady on his feet and needs assistance. He stated he was discharged from here and ,"was given a bus pass." Pt stated  [place]. EMS arrived and brought the pt back to the hospital.  Pt has extreme edema to both legs. So far he has voided 600 cc in the urinal. He does contract for safety and denies Si and HI. Pt is waiting for [facility] center to evaluate him. He stated his mother died when he was 89 years old and he is estranged from his father. Pt stated he has applied for disabilty three times and been denied. Pt is not able to walk on his own. He must use a walker and a cane. Pt also has narcolepsy. He did state he appealed the last denial. Pt washed his face and brushed his teeth but stated it was to painful to get up. Pt has voided a total of 900 cc so far. 1p-Pt is very pleasant. Spoke with Chantile at [facility]. She stated Broc would be seeing the pt.(1pm)Phoned the wound care nurse Dawn at [phone] -Will apply Allivyn to the wound site. Report given to [employer] at [facility].

## 2014-12-12 NOTE — Progress Notes (Signed)
Clinical Social Work  Patient accepted to [facility]. Patient will DC once letter of guarantee arranged.  2nd shift ED [name] to arrange transfer.  Unk Lightning, LCSW (Coverage for [employer])

## 2014-12-12 NOTE — Progress Notes (Signed)
ED CM and ED SW spoke with pt to discuss the importance of initiation of medicaid application Pt initially not agreeable to have his father contacted (father is the only emergency contact listed in epic for pt) to help with this since pt is being transferred to snf   CM discussed the benefit of getting his father or other family to apply for medicaid to assist with his stay at the facility .  Pt finally agreed to allow ED SW contact his father and told SW his father's number.  Pt states he and his father are on talking terms only and if "he don't have to pay nothing he will help me"

## 2014-12-12 NOTE — ED Notes (Signed)
Pt states he does not want to get up out of bed. Gave basin for toothbrushing and cup for water and deodorant.

## 2015-01-20 ENCOUNTER — Encounter (HOSPITAL_COMMUNITY): Payer: Self-pay | Admitting: Emergency Medicine

## 2015-01-20 ENCOUNTER — Emergency Department (HOSPITAL_COMMUNITY)
Admission: EM | Admit: 2015-01-20 | Discharge: 2015-01-20 | Disposition: A | Discharge status: Home / Self Care | Payer: No Typology Code available for payment source | Attending: Emergency Medicine | Admitting: Emergency Medicine

## 2015-01-20 DIAGNOSIS — W1839XA Other fall on same level, initial encounter: Secondary | ICD-10-CM | POA: Insufficient documentation

## 2015-01-20 DIAGNOSIS — S8991XA Unspecified injury of right lower leg, initial encounter: Secondary | ICD-10-CM | POA: Insufficient documentation

## 2015-01-20 DIAGNOSIS — Y998 Other external cause status: Secondary | ICD-10-CM | POA: Insufficient documentation

## 2015-01-20 DIAGNOSIS — Y9389 Activity, other specified: Secondary | ICD-10-CM | POA: Insufficient documentation

## 2015-01-20 DIAGNOSIS — S8992XA Unspecified injury of left lower leg, initial encounter: Secondary | ICD-10-CM | POA: Insufficient documentation

## 2015-01-20 DIAGNOSIS — Y9289 Other specified places as the place of occurrence of the external cause: Secondary | ICD-10-CM | POA: Insufficient documentation

## 2015-01-20 DIAGNOSIS — Z79899 Other long term (current) drug therapy: Secondary | ICD-10-CM | POA: Insufficient documentation

## 2015-01-20 DIAGNOSIS — Z720 Tobacco use: Secondary | ICD-10-CM | POA: Insufficient documentation

## 2015-01-20 DIAGNOSIS — W19XXXA Unspecified fall, initial encounter: Secondary | ICD-10-CM

## 2015-01-20 DIAGNOSIS — R600 Localized edema: Secondary | ICD-10-CM

## 2015-01-20 DIAGNOSIS — I10 Essential (primary) hypertension: Secondary | ICD-10-CM | POA: Insufficient documentation

## 2015-01-20 LAB — BASIC METABOLIC PANEL
Anion gap: 7 (ref 5–15)
BUN: 10 mg/dL (ref 6–20)
CALCIUM: 8.5 mg/dL — AB (ref 8.9–10.3)
CO2: 26 mmol/L (ref 22–32)
CREATININE: 0.75 mg/dL (ref 0.61–1.24)
Chloride: 107 mmol/L (ref 101–111)
Glucose, Bld: 92 mg/dL (ref 65–99)
Potassium: 3.7 mmol/L (ref 3.5–5.1)
SODIUM: 140 mmol/L (ref 135–145)

## 2015-01-20 LAB — CBC
HCT: 36.9 % — ABNORMAL LOW (ref 39.0–52.0)
Hemoglobin: 12.3 g/dL — ABNORMAL LOW (ref 13.0–17.0)
MCH: 28.9 pg (ref 26.0–34.0)
MCHC: 33.3 g/dL (ref 30.0–36.0)
MCV: 86.8 fL (ref 78.0–100.0)
Platelets: 256 10*3/uL (ref 150–400)
RBC: 4.25 MIL/uL (ref 4.22–5.81)
RDW: 15.4 % (ref 11.5–15.5)
WBC: 7.4 10*3/uL (ref 4.0–10.5)

## 2015-01-20 NOTE — Progress Notes (Signed)
[facility] consulted regarding follow up for wound care to bilateral lower extremities.  Patient noted to have orange card and to be patient of Dr. Daleen Squibb at [facility].  EDCM called scheduler at [facility] and left message requesting appointment.  EDCM also called and spoke to [facility] CM at [facility] and provided appointment at [facility] on September 19th at 1130am.  [facility] called [facility] care clinic and scheduled appointment for October 3rd at 130pm to arrive by 115pm.  Per wound care clinic they do accept the orange card.  [facility] EDCM spoke to patient at bedside and informed patient of his upcoming appointments.  Patient agreeable to this.  Appointments are listed on patient's AVS discharge instructions.  Patient also confirmed he goes to [facility] and may receive assistance with his medications there.  EDCM provided patient with bus passes to get to his appointments.  EDCM noted patient to have rolator in his room.  Patient thankful for assistance.  Discussed patient with EDP Dr. Clarene Duke and informed her of above.  No further EDCM needs at this time.

## 2015-01-20 NOTE — ED Notes (Signed)
Patient has agreed to change into a gown and is changing now. Patient is aware that a urine sample is needed, urinal is at the bedside.

## 2015-01-20 NOTE — ED Notes (Signed)
Patient refuses an ekg to be done patient states "there is nothing wrong with my heart I just fell", Patient also refused to change out into a gown but is requesting paper scrubs now. Patient is currently sitting in bedside chair watching tv.

## 2015-01-20 NOTE — Care Management (Signed)
[facility]:  This Case Manager received phone call from Radford Pax, RN CM who indicated patient has [facility] discount and needing ED follow-up appointment for bilateral leg edema.  Appointment obtained on 01/30/15 at 1130 with Dr. Venetia Night. Radford Pax, RN CM updated. She indicates she plans to also follow-up with [facility] as well.

## 2015-01-20 NOTE — ED Notes (Addendum)
Pt states "I got hot and fell." States he's not in any pain. "I just need my legs re-wrapped." Legs edematous bilaterally. States he didn't lose consciousness, just got hot and fell. States it has happened multiple times in the past.

## 2015-01-20 NOTE — Discharge Instructions (Signed)
Fall Prevention and Home Safety Falls cause injuries and can affect all age groups. It is possible to prevent falls.  HOW TO PREVENT FALLS  Wear shoes with rubber soles that do not have an opening for your toes.  Keep the inside and outside of your house well lit.  Use night lights throughout your home.  Remove clutter from floors.  Clean up floor spills.  Remove throw rugs or fasten them to the floor with carpet tape.  Do not place electrical cords across pathways.  Put grab bars by your tub, shower, and toilet. Do not use towel bars as grab bars.  Put handrails on both sides of the stairway. Fix loose handrails.  Do not climb on stools or stepladders, if possible.  Do not wax your floors.  Repair uneven or unsafe sidewalks, walkways, or stairs.  Keep items you use a lot within reach.  Be aware of pets.  Keep emergency numbers next to the telephone.  Put smoke detectors in your home and near bedrooms. Ask your doctor what other things you can do to prevent falls. Document Released: 02/23/2009 Document Revised: 10/29/2011 Document Reviewed: 07/30/2011 [facility] Patient Information 2015 [place], [place]. This information is not intended to replace advice given to you by your health care provider. Make sure you discuss any questions you have with your health care provider.  Edema Edema is an abnormal buildup of fluids. It is more common in your legs and thighs. Painless swelling of the feet and ankles is more likely as a person ages. It also is common in looser skin, like around your eyes. HOME CARE   Keep the affected body part above the level of the heart while lying down.  Do not sit still or stand for a long time.  Do not put anything right under your knees when you lie down.  Do not wear tight clothes on your upper legs.  Exercise your legs to help the puffiness (swelling) go down.  Wear elastic bandages or support stockings as told by your doctor.  A low-salt  diet may help lessen the puffiness.  Only take medicine as told by your doctor. GET HELP IF:  Treatment is not working.  You have heart, liver, or kidney disease and notice that your skin looks puffy or shiny.  You have puffiness in your legs that does not get better when you raise your legs.  You have sudden weight gain for no reason. GET HELP RIGHT AWAY IF:   You have shortness of breath or chest pain.  You cannot breathe when you lie down.  You have pain, redness, or warmth in the areas that are puffy.  You have heart, liver, or kidney disease and get edema all of a sudden.  You have a fever and your symptoms get worse all of a sudden. MAKE SURE YOU:   Understand these instructions.  Will watch your condition.  Will get help right away if you are not doing well or get worse. Document Released: 10/16/2007 Document Revised: 05/04/2013 Document Reviewed: 02/19/2013 [facility] Patient Information 2015 [place], [place]. This information is not intended to replace advice given to you by your health care provider. Make sure you discuss any questions you have with your health care provider.
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
## 2015-01-20 NOTE — Progress Notes (Signed)
[name] [place] shelter [place] [phone].

## 2015-01-23 ENCOUNTER — Emergency Department (HOSPITAL_COMMUNITY)
Admission: EM | Admit: 2015-01-23 | Discharge: 2015-01-23 | Disposition: A | Discharge status: Home / Self Care | Payer: No Typology Code available for payment source | Attending: Emergency Medicine | Admitting: Emergency Medicine

## 2015-01-23 ENCOUNTER — Encounter (HOSPITAL_COMMUNITY): Payer: Self-pay

## 2015-01-23 DIAGNOSIS — S8991XA Unspecified injury of right lower leg, initial encounter: Secondary | ICD-10-CM | POA: Insufficient documentation

## 2015-01-23 DIAGNOSIS — Y9389 Activity, other specified: Secondary | ICD-10-CM | POA: Insufficient documentation

## 2015-01-23 DIAGNOSIS — Z720 Tobacco use: Secondary | ICD-10-CM | POA: Insufficient documentation

## 2015-01-23 DIAGNOSIS — Y9289 Other specified places as the place of occurrence of the external cause: Secondary | ICD-10-CM | POA: Insufficient documentation

## 2015-01-23 DIAGNOSIS — I10 Essential (primary) hypertension: Secondary | ICD-10-CM | POA: Insufficient documentation

## 2015-01-23 DIAGNOSIS — Z79899 Other long term (current) drug therapy: Secondary | ICD-10-CM | POA: Insufficient documentation

## 2015-01-23 DIAGNOSIS — S8992XA Unspecified injury of left lower leg, initial encounter: Secondary | ICD-10-CM | POA: Insufficient documentation

## 2015-01-23 DIAGNOSIS — W1839XA Other fall on same level, initial encounter: Secondary | ICD-10-CM | POA: Insufficient documentation

## 2015-01-23 DIAGNOSIS — M7989 Other specified soft tissue disorders: Secondary | ICD-10-CM

## 2015-01-23 DIAGNOSIS — Y998 Other external cause status: Secondary | ICD-10-CM | POA: Insufficient documentation

## 2015-01-23 NOTE — Progress Notes (Addendum)
Patient was seen in [facility] on 09/09.  At that time, he was scheduled a follow up appointment at [facility] to see Dr. Venetia Night on 01/30/2015 at 1130am.  Patient was also scheduled an appointment at the wound care clinic on Oct 3rd at 130pm  for bilateral lower extremities.  Patient is also seen at [facility] and can receive his medications at no cost. No further [facility] needs at this time.

## 2015-01-23 NOTE — Discharge Instructions (Signed)
Peripheral Edema °You have swelling in your legs (peripheral edema). This swelling is due to excess accumulation of salt and water in your body. Edema may be a sign of heart, kidney or liver disease, or a side effect of a medication. It may also be due to problems in the leg veins. Elevating your legs and using special support stockings may be very helpful, if the cause of the swelling is due to poor venous circulation. Avoid long periods of standing, whatever the cause. °Treatment of edema depends on identifying the cause. Chips, pretzels, pickles and other salty foods should be avoided. Restricting salt in your diet is almost always needed. Water pills (diuretics) are often used to remove the excess salt and water from your body via urine. These medicines prevent the kidney from reabsorbing sodium. This increases urine flow. °Diuretic treatment may also result in lowering of potassium levels in your body. Potassium supplements may be needed if you have to use diuretics daily. Daily weights can help you keep track of your progress in clearing your edema. You should call your caregiver for follow up care as recommended. °SEEK IMMEDIATE MEDICAL CARE IF:  °· You have increased swelling, pain, redness, or heat in your legs. °· You develop shortness of breath, especially when lying down. °· You develop chest or abdominal pain, weakness, or fainting. °· You have a fever. °Document Released: 06/06/2004 Document Revised: 07/22/2011 Document Reviewed: 05/17/2009 °ExitCare® Patient Information ©2015 [employer]. This information is not intended to replace advice given to you by your health care provider. Make sure you discuss any questions you have with your health care provider. ° °
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
## 2015-01-23 NOTE — ED Notes (Signed)
Per EMS- Patient c/o fall 1 hour ago. Patient states he lost his balance. Patient has swelling to bilateral legs. Patient has a history of CHF and is non compliant with his meds. Patient is homeless.

## 2015-01-30 ENCOUNTER — Inpatient Hospital Stay: Payer: No Typology Code available for payment source | Admitting: Family Medicine

## 2015-02-12 ENCOUNTER — Emergency Department (HOSPITAL_COMMUNITY)
Admission: EM | Admit: 2015-02-12 | Discharge: 2015-02-12 | Disposition: A | Discharge status: Home / Self Care | Payer: No Typology Code available for payment source | Attending: Emergency Medicine | Admitting: Emergency Medicine

## 2015-02-12 ENCOUNTER — Encounter (HOSPITAL_COMMUNITY): Payer: Self-pay | Admitting: *Deleted

## 2015-02-12 DIAGNOSIS — I10 Essential (primary) hypertension: Secondary | ICD-10-CM | POA: Insufficient documentation

## 2015-02-12 DIAGNOSIS — Y9289 Other specified places as the place of occurrence of the external cause: Secondary | ICD-10-CM | POA: Insufficient documentation

## 2015-02-12 DIAGNOSIS — R2243 Localized swelling, mass and lump, lower limb, bilateral: Secondary | ICD-10-CM | POA: Insufficient documentation

## 2015-02-12 DIAGNOSIS — Y9389 Activity, other specified: Secondary | ICD-10-CM | POA: Insufficient documentation

## 2015-02-12 DIAGNOSIS — Z720 Tobacco use: Secondary | ICD-10-CM | POA: Insufficient documentation

## 2015-02-12 DIAGNOSIS — W19XXXA Unspecified fall, initial encounter: Secondary | ICD-10-CM

## 2015-02-12 DIAGNOSIS — Z79899 Other long term (current) drug therapy: Secondary | ICD-10-CM | POA: Insufficient documentation

## 2015-02-12 DIAGNOSIS — W1830XA Fall on same level, unspecified, initial encounter: Secondary | ICD-10-CM | POA: Insufficient documentation

## 2015-02-12 DIAGNOSIS — S50312A Abrasion of left elbow, initial encounter: Secondary | ICD-10-CM | POA: Insufficient documentation

## 2015-02-12 DIAGNOSIS — Y998 Other external cause status: Secondary | ICD-10-CM | POA: Insufficient documentation

## 2015-02-12 DIAGNOSIS — R600 Localized edema: Secondary | ICD-10-CM

## 2015-02-12 NOTE — Discharge Instructions (Signed)
-   Go to [facility] on Monday to check on status of facility placement and discuss increasing frequency of falls with Maryann   Peripheral Edema You have swelling in your legs (peripheral edema). This swelling is due to excess accumulation of salt and water in your body. Edema may be a sign of heart, kidney or liver disease, or a side effect of a medication. It may also be due to problems in the leg veins. Elevating your legs and using special support stockings may be very helpful, if the cause of the swelling is due to poor venous circulation. Avoid long periods of standing, whatever the cause. Treatment of edema depends on identifying the cause. Chips, pretzels, pickles and other salty foods should be avoided. Restricting salt in your diet is almost always needed. Water pills (diuretics) are often used to remove the excess salt and water from your body via urine. These medicines prevent the kidney from reabsorbing sodium. This increases urine flow. Diuretic treatment may also result in lowering of potassium levels in your body. Potassium supplements may be needed if you have to use diuretics daily. Daily weights can help you keep track of your progress in clearing your edema. You should call your caregiver for follow up care as recommended. SEEK IMMEDIATE MEDICAL CARE IF:   You have increased swelling, pain, redness, or heat in your legs.  You develop shortness of breath, especially when lying down.  You develop chest or abdominal pain, weakness, or fainting.  You have a fever. Document Released: 06/06/2004 Document Revised: 07/22/2011 Document Reviewed: 05/17/2009 [facility] Patient Information 2015 [place], [place]. This information is not intended to replace advice given to you by your health care provider. Make sure you discuss any questions you have with your health care provider.

## 2015-02-12 NOTE — ED Notes (Signed)
Per ems pt is from [facility], homeless, pt had witnessed fall, no LOC, pt refused c collar. Small abrasion to left elbow. Denies pain.   Upon rn assessment pt alert and oriented x4, reports chronic trouble with equilibrium, pt had mechanical fall, uses walker to ambulate.

## 2015-02-12 NOTE — ED Notes (Signed)
Pt verbalized understanding of d/c teaching, was given a bus pass, and was escorted to the lobby.
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
## 2015-02-13 ENCOUNTER — Encounter (HOSPITAL_BASED_OUTPATIENT_CLINIC_OR_DEPARTMENT_OTHER): Payer: No Typology Code available for payment source | Attending: Plastic Surgery

## 2015-02-24 ENCOUNTER — Encounter (HOSPITAL_COMMUNITY): Payer: Self-pay | Admitting: Emergency Medicine

## 2015-02-24 ENCOUNTER — Emergency Department (HOSPITAL_COMMUNITY)
Admission: EM | Admit: 2015-02-24 | Discharge: 2015-02-24 | Disposition: A | Discharge status: Home / Self Care | Payer: No Typology Code available for payment source | Attending: Emergency Medicine | Admitting: Emergency Medicine

## 2015-02-24 DIAGNOSIS — R000 Tachycardia, unspecified: Secondary | ICD-10-CM | POA: Insufficient documentation

## 2015-02-24 DIAGNOSIS — Z720 Tobacco use: Secondary | ICD-10-CM | POA: Insufficient documentation

## 2015-02-24 DIAGNOSIS — I10 Essential (primary) hypertension: Secondary | ICD-10-CM | POA: Insufficient documentation

## 2015-02-24 DIAGNOSIS — R2243 Localized swelling, mass and lump, lower limb, bilateral: Secondary | ICD-10-CM | POA: Insufficient documentation

## 2015-02-24 DIAGNOSIS — Z79899 Other long term (current) drug therapy: Secondary | ICD-10-CM | POA: Insufficient documentation

## 2015-02-24 DIAGNOSIS — M7989 Other specified soft tissue disorders: Secondary | ICD-10-CM

## 2015-02-24 LAB — COMPREHENSIVE METABOLIC PANEL
ALK PHOS: 70 U/L (ref 38–126)
ALT: 20 U/L (ref 17–63)
AST: 22 U/L (ref 15–41)
Albumin: 3.3 g/dL — ABNORMAL LOW (ref 3.5–5.0)
Anion gap: 5 (ref 5–15)
BILIRUBIN TOTAL: 0.4 mg/dL (ref 0.3–1.2)
BUN: 16 mg/dL (ref 6–20)
CALCIUM: 8.8 mg/dL — AB (ref 8.9–10.3)
CO2: 27 mmol/L (ref 22–32)
CREATININE: 0.66 mg/dL (ref 0.61–1.24)
Chloride: 110 mmol/L (ref 101–111)
Glucose, Bld: 128 mg/dL — ABNORMAL HIGH (ref 65–99)
Potassium: 3.8 mmol/L (ref 3.5–5.1)
Sodium: 142 mmol/L (ref 135–145)
TOTAL PROTEIN: 6.8 g/dL (ref 6.5–8.1)

## 2015-02-24 LAB — CBC WITH DIFFERENTIAL/PLATELET
BASOS ABS: 0 10*3/uL (ref 0.0–0.1)
BASOS PCT: 1 %
EOS ABS: 0.1 10*3/uL (ref 0.0–0.7)
Eosinophils Relative: 1 %
HCT: 36.3 % — ABNORMAL LOW (ref 39.0–52.0)
HEMOGLOBIN: 11.8 g/dL — AB (ref 13.0–17.0)
Lymphocytes Relative: 18 %
Lymphs Abs: 1.1 10*3/uL (ref 0.7–4.0)
MCH: 28.8 pg (ref 26.0–34.0)
MCHC: 32.5 g/dL (ref 30.0–36.0)
MCV: 88.5 fL (ref 78.0–100.0)
MONOS PCT: 9 %
Monocytes Absolute: 0.6 10*3/uL (ref 0.1–1.0)
NEUTROS PCT: 71 %
Neutro Abs: 4.6 10*3/uL (ref 1.7–7.7)
Platelets: 298 10*3/uL (ref 150–400)
RBC: 4.1 MIL/uL — ABNORMAL LOW (ref 4.22–5.81)
RDW: 16.4 % — AB (ref 11.5–15.5)
WBC: 6.4 10*3/uL (ref 4.0–10.5)
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
## 2015-02-24 NOTE — ED Notes (Signed)
Patient arrived via EMS.  He was picked up from [facility].  Patient is ambulatory with walker.  Per EMS, patient states he fell at resource center due to weakness and dizziness.  Patient refused spinal mobilization.  Fall was not witnessed.  He denies LOC.

## 2015-02-24 NOTE — Discharge Instructions (Signed)
Edema °Edema is an abnormal buildup of fluids in your body tissues. Edema is somewhat dependent on gravity to pull the fluid to the lowest place in your body. That makes the condition more common in the legs and thighs (lower extremities). Painless swelling of the feet and ankles is common and becomes more likely as you get older. It is also common in looser tissues, like around your eyes.  °When the affected area is squeezed, the fluid may move out of that spot and leave a dent for a few moments. This dent is called pitting.  °CAUSES  °There are many possible causes of edema. Eating too much salt and being on your feet or sitting for a long time can cause edema in your legs and ankles. Hot weather may make edema worse. Common medical causes of edema include: °· Heart failure. °· Liver disease. °· Kidney disease. °· Weak blood vessels in your legs. °· Cancer. °· An injury. °· Pregnancy. °· Some medications. °· Obesity.  °SYMPTOMS  °Edema is usually painless. Your skin may look swollen or shiny.  °DIAGNOSIS  °Your health care provider may be able to diagnose edema by asking about your medical history and doing a physical exam. You may need to have tests such as X-rays, an electrocardiogram, or blood tests to check for medical conditions that may cause edema.  °TREATMENT  °Edema treatment depends on the cause. If you have heart, liver, or kidney disease, you need the treatment appropriate for these conditions. General treatment may include: °· Elevation of the affected body part above the level of your heart. °· Compression of the affected body part. Pressure from elastic bandages or support stockings squeezes the tissues and forces fluid back into the blood vessels. This keeps fluid from entering the tissues. °· Restriction of fluid and salt intake. °· Use of a water pill (diuretic). These medications are appropriate only for some types of edema. They pull fluid out of your body and make you urinate more often. This  gets rid of fluid and reduces swelling, but diuretics can have side effects. Only use diuretics as directed by your health care provider. °HOME CARE INSTRUCTIONS  °· Keep the affected body part above the level of your heart when you are lying down.   °· Do not sit still or stand for prolonged periods.   °· Do not put anything directly under your knees when lying down. °· Do not wear constricting clothing or garters on your upper legs.   °· Exercise your legs to work the fluid back into your blood vessels. This may help the swelling go down.   °· Wear elastic bandages or support stockings to reduce ankle swelling as directed by your health care provider.   °· Eat a low-salt diet to reduce fluid if your health care provider recommends it.   °· Only take medicines as directed by your health care provider.  °SEEK MEDICAL CARE IF:  °· Your edema is not responding to treatment. °· You have heart, liver, or kidney disease and notice symptoms of edema. °· You have edema in your legs that does not improve after elevating them.   °· You have sudden and unexplained weight gain. °SEEK IMMEDIATE MEDICAL CARE IF:  °· You develop shortness of breath or chest pain.   °· You cannot breathe when you lie down. °· You develop pain, redness, or warmth in the swollen areas.   °· You have heart, liver, or kidney disease and suddenly get edema. °· You have a fever and your symptoms suddenly get worse. °MAKE SURE YOU:  °·   Understand these instructions. °· Will watch your condition. °· Will get help right away if you are not doing well or get worse. °  °This information is not intended to replace advice given to you by your health care provider. Make sure you discuss any questions you have with your health care provider. °  °Document Released: 04/29/2005 Document Revised: 05/20/2014 Document Reviewed: 02/19/2013 °Elsevier Interactive Patient Education ©2016 [employer] ° °

## 2015-02-24 NOTE — ED Notes (Signed)
Bed: WA06 Expected date:  Expected time:  Means of arrival:  Comments: ems 

## 2015-03-26 ENCOUNTER — Ambulatory Visit (HOSPITAL_COMMUNITY): Admit: 2015-03-26 | Discharge: 2015-03-26 | Disposition: A | Discharge status: Home / Self Care | Payer: Medicaid Other

## 2015-03-26 ENCOUNTER — Emergency Department (HOSPITAL_COMMUNITY): Payer: Medicaid Other

## 2015-03-26 ENCOUNTER — Observation Stay (HOSPITAL_COMMUNITY)
Admission: EM | Admit: 2015-03-26 | Discharge: 2015-03-27 | Disposition: A | Discharge status: Home / Self Care | Payer: Medicaid Other | Attending: Internal Medicine | Admitting: Internal Medicine

## 2015-03-26 ENCOUNTER — Encounter (HOSPITAL_COMMUNITY): Payer: Self-pay

## 2015-03-26 DIAGNOSIS — R531 Weakness: Secondary | ICD-10-CM | POA: Diagnosis not present

## 2015-03-26 DIAGNOSIS — F17210 Nicotine dependence, cigarettes, uncomplicated: Secondary | ICD-10-CM | POA: Diagnosis not present

## 2015-03-26 DIAGNOSIS — G4733 Obstructive sleep apnea (adult) (pediatric): Secondary | ICD-10-CM | POA: Diagnosis not present

## 2015-03-26 DIAGNOSIS — Z79899 Other long term (current) drug therapy: Secondary | ICD-10-CM | POA: Insufficient documentation

## 2015-03-26 DIAGNOSIS — I10 Essential (primary) hypertension: Secondary | ICD-10-CM | POA: Insufficient documentation

## 2015-03-26 DIAGNOSIS — R609 Edema, unspecified: Secondary | ICD-10-CM

## 2015-03-26 DIAGNOSIS — Z590 Homelessness: Secondary | ICD-10-CM | POA: Diagnosis not present

## 2015-03-26 DIAGNOSIS — R42 Dizziness and giddiness: Principal | ICD-10-CM | POA: Insufficient documentation

## 2015-03-26 DIAGNOSIS — I890 Lymphedema, not elsewhere classified: Secondary | ICD-10-CM | POA: Insufficient documentation

## 2015-03-26 DIAGNOSIS — R52 Pain, unspecified: Secondary | ICD-10-CM

## 2015-03-26 DIAGNOSIS — R29898 Other symptoms and signs involving the musculoskeletal system: Secondary | ICD-10-CM

## 2015-03-26 HISTORY — DX: Weakness: R53.1

## 2015-03-26 LAB — COMPREHENSIVE METABOLIC PANEL
ALT: 19 U/L (ref 17–63)
ANION GAP: 7 (ref 5–15)
AST: 18 U/L (ref 15–41)
Albumin: 3.7 g/dL (ref 3.5–5.0)
Alkaline Phosphatase: 69 U/L (ref 38–126)
BILIRUBIN TOTAL: 0.4 mg/dL (ref 0.3–1.2)
BUN: 13 mg/dL (ref 6–20)
CO2: 27 mmol/L (ref 22–32)
Calcium: 8.5 mg/dL — ABNORMAL LOW (ref 8.9–10.3)
Chloride: 107 mmol/L (ref 101–111)
Creatinine, Ser: 0.76 mg/dL (ref 0.61–1.24)
GFR calc Af Amer: >60 mL/min (ref >60)
Glucose, Bld: 95 mg/dL (ref 65–99)
POTASSIUM: 4.1 mmol/L (ref 3.5–5.1)
Sodium: 141 mmol/L (ref 135–145)
TOTAL PROTEIN: 7 g/dL (ref 6.5–8.1)

## 2015-03-26 LAB — DIFFERENTIAL
BASOS PCT: 1 %
Basophils Absolute: 0.1 10*3/uL (ref 0.0–0.1)
EOS PCT: 1 %
Eosinophils Absolute: 0 10*3/uL (ref 0.0–0.7)
LYMPHS PCT: 29 %
Lymphs Abs: 1.9 10*3/uL (ref 0.7–4.0)
MONO ABS: 0.6 10*3/uL (ref 0.1–1.0)
Monocytes Relative: 9 %
NEUTROS ABS: 4 10*3/uL (ref 1.7–7.7)
NEUTROS PCT: 60 %

## 2015-03-26 LAB — URINALYSIS, ROUTINE W REFLEX MICROSCOPIC
Bilirubin Urine: NEGATIVE
Glucose, UA: NEGATIVE mg/dL
Hgb urine dipstick: NEGATIVE
KETONES UR: NEGATIVE mg/dL
LEUKOCYTES UA: NEGATIVE
NITRITE: NEGATIVE
PH: 5.5 (ref 5.0–8.0)
Protein, ur: NEGATIVE mg/dL
Specific Gravity, Urine: 1.028 (ref 1.005–1.030)
Urobilinogen, UA: 1 mg/dL (ref 0.0–1.0)

## 2015-03-26 LAB — RAPID URINE DRUG SCREEN, HOSP PERFORMED
Amphetamines: NOT DETECTED
Barbiturates: NOT DETECTED
Benzodiazepines: NOT DETECTED
COCAINE: NOT DETECTED
OPIATES: NOT DETECTED
Tetrahydrocannabinol: POSITIVE — AB

## 2015-03-26 LAB — CBC
HCT: 37.3 % — ABNORMAL LOW (ref 39.0–52.0)
Hemoglobin: 12.3 g/dL — ABNORMAL LOW (ref 13.0–17.0)
MCH: 29.1 pg (ref 26.0–34.0)
MCHC: 33 g/dL (ref 30.0–36.0)
MCV: 88.2 fL (ref 78.0–100.0)
PLATELETS: 273 10*3/uL (ref 150–400)
RBC: 4.23 MIL/uL (ref 4.22–5.81)
RDW: 15.9 % — ABNORMAL HIGH (ref 11.5–15.5)
WBC: 6.6 10*3/uL (ref 4.0–10.5)

## 2015-03-26 LAB — I-STAT CHEM 8, ED
BUN: 15 mg/dL (ref 6–20)
CALCIUM ION: 1.07 mmol/L — AB (ref 1.12–1.23)
CHLORIDE: 103 mmol/L (ref 101–111)
Creatinine, Ser: 0.8 mg/dL (ref 0.61–1.24)
Glucose, Bld: 93 mg/dL (ref 65–99)
HEMATOCRIT: 39 % (ref 39.0–52.0)
Hemoglobin: 13.3 g/dL (ref 13.0–17.0)
POTASSIUM: 4.3 mmol/L (ref 3.5–5.1)
Sodium: 141 mmol/L (ref 135–145)
TCO2: 28 mmol/L (ref 0–100)

## 2015-03-26 LAB — ETHANOL

## 2015-03-26 LAB — BRAIN NATRIURETIC PEPTIDE: B NATRIURETIC PEPTIDE 5: 17.4 pg/mL (ref 0.0–100.0)

## 2015-03-26 LAB — I-STAT TROPONIN, ED: TROPONIN I, POC: 0 ng/mL (ref 0.00–0.08)

## 2015-03-26 LAB — APTT: aPTT: 40 seconds — ABNORMAL HIGH (ref 24–37)

## 2015-03-26 LAB — PROTIME-INR
INR: 1.13 (ref 0.00–1.49)
PROTHROMBIN TIME: 14.7 s (ref 11.6–15.2)

## 2015-03-26 MED ORDER — DIAZEPAM 5 MG/ML IJ SOLN
5.0000 mg | Freq: Once | INTRAMUSCULAR | Status: AC
  Administered 2015-03-26: 5 mg via INTRAVENOUS
  Filled 2015-03-26: qty 2

## 2015-03-26 MED ORDER — STROKE: EARLY STAGES OF RECOVERY BOOK
Freq: Once | Status: AC
Start: 2015-03-26 — End: 2015-03-26
  Administered 2015-03-26: 20:00:00
  Filled 2015-03-26: qty 1

## 2015-03-26 MED ORDER — ASPIRIN 325 MG PO TABS
325.0000 mg | ORAL_TABLET | Freq: Every day | ORAL | Status: DC
  Administered 2015-03-26 – 2015-03-27 (×2): 325 mg via ORAL
  Filled 2015-03-26 (×2): qty 1

## 2015-03-26 MED ORDER — ENOXAPARIN SODIUM 40 MG/0.4ML ~~LOC~~ SOLN
40.0000 mg | SUBCUTANEOUS | Status: DC
  Administered 2015-03-26: 40 mg via SUBCUTANEOUS
  Filled 2015-03-26: qty 0.4

## 2015-03-26 MED ORDER — CARVEDILOL 6.25 MG PO TABS
6.2500 mg | ORAL_TABLET | Freq: Two times a day (BID) | ORAL | Status: DC
  Administered 2015-03-27 (×2): 6.25 mg via ORAL
  Filled 2015-03-26 (×3): qty 1

## 2015-03-26 MED ORDER — FUROSEMIDE 40 MG PO TABS
40.0000 mg | ORAL_TABLET | Freq: Two times a day (BID) | ORAL | Status: DC
  Administered 2015-03-26 – 2015-03-27 (×3): 40 mg via ORAL
  Filled 2015-03-26 (×3): qty 1

## 2015-03-26 NOTE — ED Notes (Signed)
UNABLE TO COLLECT LABS AT THIS TIME PATIENT GOING TO XRAY 
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
## 2015-03-26 NOTE — Progress Notes (Signed)
Pt arrived to unit alert and oriented. Oriented to room. Call bell at side. Bed alarm activated. Will continue to monitor. Landon Bassford M, RN 

## 2015-03-26 NOTE — ED Notes (Signed)
Bed: [id number] Expected date:  Expected time:  Means of arrival:  Comments: ROOM 15  49 yo leg pain

## 2015-03-26 NOTE — ED Notes (Signed)
He c/o chronic bilat. Leg/feet pain and homelessness.  He is in no distress.

## 2015-03-26 NOTE — ED Notes (Signed)
Pt is in restroom at this time state he have to make a bm and could give us a urine sample at the same time. Pt did request to walk to restroom no leg pain just pain in his big toe

## 2015-03-26 NOTE — ED Notes (Signed)
Updated [facility] that patient is leaving with [facility]

## 2015-03-26 NOTE — ED Notes (Signed)
He remains in no distress.  MRI tech. Has just  Called and IV med. Given as per on-call to mri.

## 2015-03-26 NOTE — ED Notes (Signed)
Pt was unable to give urine sample while in restroom

## 2015-03-26 NOTE — ED Notes (Signed)
UNABLE TO

## 2015-03-27 ENCOUNTER — Ambulatory Visit (HOSPITAL_COMMUNITY): Payer: Medicaid Other

## 2015-03-27 ENCOUNTER — Observation Stay (HOSPITAL_COMMUNITY): Payer: Medicaid Other

## 2015-03-27 ENCOUNTER — Encounter (HOSPITAL_COMMUNITY): Payer: Self-pay | Admitting: General Practice

## 2015-03-27 ENCOUNTER — Ambulatory Visit (HOSPITAL_BASED_OUTPATIENT_CLINIC_OR_DEPARTMENT_OTHER): Payer: Medicaid Other

## 2015-03-27 DIAGNOSIS — I10 Essential (primary) hypertension: Secondary | ICD-10-CM

## 2015-03-27 DIAGNOSIS — R531 Weakness: Secondary | ICD-10-CM

## 2015-03-27 DIAGNOSIS — R29898 Other symptoms and signs involving the musculoskeletal system: Secondary | ICD-10-CM | POA: Insufficient documentation

## 2015-03-27 DIAGNOSIS — I6789 Other cerebrovascular disease: Secondary | ICD-10-CM

## 2015-03-27 DIAGNOSIS — R609 Edema, unspecified: Secondary | ICD-10-CM

## 2015-03-27 LAB — LIPID PANEL
Cholesterol: 151 mg/dL (ref 0–200)
HDL: 38 mg/dL — AB (ref >40)
LDL CALC: 96 mg/dL (ref 0–99)
TRIGLYCERIDES: 85 mg/dL (ref <150)
Total CHOL/HDL Ratio: 4 RATIO
VLDL: 17 mg/dL (ref 0–40)

## 2015-03-27 MED ORDER — FUROSEMIDE 40 MG PO TABS: 40.0000 mg | ORAL_TABLET | Freq: Two times a day (BID) | ORAL | Status: DC

## 2015-03-27 MED ORDER — ASPIRIN 325 MG PO TABS: 325.0000 mg | ORAL_TABLET | Freq: Every day | ORAL | Status: DC

## 2015-03-27 MED ORDER — CARVEDILOL 6.25 MG PO TABS: 6.2500 mg | ORAL_TABLET | Freq: Two times a day (BID) | ORAL | Status: DC

## 2015-03-27 NOTE — Evaluation (Signed)
Occupational Therapy Evaluation Patient Details Name: [name] MRN: [id number] DOB: 11/16/1965 Today's Date: 03/27/2015    History of Present Illness Pt is a 49 y/o male with a PMH significant for HTN, chronic lymphedema, OSA. Pt presents with R sided weakness and pain, which pt reports has been ongoing for over a year but has recently worsened. MRI in Dec 2015 was negative for stroke.    Clinical Impression   Pt was homeless prior to admission, but able to care for himself and ambulate with a rollator.  Pt presents with longstanding R side weakness and incoordination, poor sitting and standing balance and obesity interfering with ability to perform ADL and mobility.  Pt with decreased safety awareness and high fall risk.  Pt will need post acute rehab.  Will follow acutely.    Follow Up Recommendations  SNF    Equipment Recommendations       Recommendations for Other Services       Precautions / Restrictions Precautions Precautions: Fall Precaution Comments: Very unsteady on his feet; poor trunk control, edematous legs Restrictions Weight Bearing Restrictions: No      Mobility Bed Mobility Overal bed mobility: Needs Assistance Bed Mobility: Sit to Supine      Sit to supine: Mod assist   General bed mobility comments: assist to raise LEs into bed  Transfers Overall transfer level: Needs assistance Equipment used: 4-wheeled walker Transfers: Sit to/from Stand Sit to Stand: +2 physical assistance;Mod assist         General transfer comment: +2 mod assist from toilet, mod assist with second person for safety from recliner,  poor control of descent    Balance Overall balance assessment: Needs assistance Sitting-balance support: Feet supported Sitting balance-Leahy Scale: Poor Sitting balance - Comments: posterior lean Postural control: Posterior lean Standing balance support: Bilateral upper extremity supported Standing balance-Leahy Scale:  Poor Standing balance comment: unable to release grasp of walker to perform LB bathing and dressing                            ADL Overall ADL's : Needs assistance/impaired Eating/Feeding: Set up;Sitting Eating/Feeding Details (indicate cue type and reason): uses L hand to self feed, assisted to open packages Grooming: Wash/dry hands;Wash/dry face;Set up;Sitting   Upper Body Bathing: Minimal assitance;Sitting Upper Body Bathing Details (indicate cue type and reason): assisted with back Lower Body Bathing: Total assistance;Sit to/from stand   Upper Body Dressing : Set up;Sitting   Lower Body Dressing: Total assistance;Sit to/from stand   Toilet Transfer: +2 for safety/equipment;Moderate assistance;Ambulation   Toileting- Clothing Manipulation and Hygiene: Total assistance;Sit to/from stand       Functional mobility during ADLs: Rolling walker;Minimal assistance General ADL Comments: Pt with poor hygiene. Grateful for assistance to take a bath.     Vision Additional Comments: pt reports poor acuity   Perception     Praxis      Pertinent Vitals/Pain Pain Assessment: Faces Pain Score: 0-No pain Faces Pain Scale: Hurts little more Pain Location: LEs Pain Descriptors / Indicators: Sore;Heaviness Pain Intervention(s): Repositioned;Monitored during session     Hand Dominance Right   Extremity/Trunk Assessment Upper Extremity Assessment Upper Extremity Assessment: RUE deficits/detail RUE Deficits / Details: longstanding weakness, uses L as lead extremity during ADL RUE Coordination: decreased fine motor;decreased gross motor   Lower Extremity Assessment Lower Extremity Assessment: Defer to PT evaluation RLE Deficits / Details: MMT only completed in hip flexors with decreased strength in  R noted. Pt with lymphedema in B lower legs and quads/hams testing not completed due to pain. At least 3/5 grossly with functional tasks. RLE: Unable to fully assess due to  pain RLE Sensation: decreased light touch RLE Coordination: decreased fine motor;decreased gross motor   Cervical / Trunk Assessment Cervical / Trunk Assessment: Normal   Communication Communication Communication: No difficulties   Cognition Arousal/Alertness: Awake/alert Behavior During Therapy: WFL for tasks assessed/performed Overall Cognitive Status: History of cognitive impairments - at baseline       Memory: Decreased short-term memory             General Comments       Exercises       Shoulder Instructions      Home Living Family/patient expects to be discharged to:: Shelter/Homeless     Type of Home: Homeless                                  Prior Functioning/Environment Level of Independence: Independent with assistive device(s)        Comments: Has rollator in room    OT Diagnosis: Generalized weakness;Cognitive deficits;Acute pain;Blindness and low vision;Hemiplegia dominant side   OT Problem List: Decreased strength;Decreased activity tolerance;Impaired balance (sitting and/or standing);Impaired vision/perception;Decreased coordination;Decreased safety awareness;Decreased cognition;Decreased knowledge of use of DME or AE;Obesity;Pain;Impaired UE functional use   OT Treatment/Interventions: Self-care/ADL training;DME and/or AE instruction;Therapeutic activities;Patient/family education;Balance training    OT Goals(Current goals can be found in the care plan section) Acute Rehab OT Goals Patient Stated Goal: Get stronger before being back out on his own OT Goal Formulation: With patient Time For Goal Achievement: 04/10/15 Potential to Achieve Goals: Fair ADL Goals Pt Will Perform Grooming: standing;with min guard assist Pt Will Perform Lower Body Bathing: with adaptive equipment;sit to/from stand;with min assist Pt Will Perform Lower Body Dressing: with adaptive equipment;sit to/from stand;with min assist Pt Will Transfer to Toilet:  with min guard assist;squat pivot transfer;bedside commode (over toilet) Pt Will Perform Toileting - Clothing Manipulation and hygiene: with min assist;sit to/from stand  OT Frequency: Min 2X/week   Barriers to D/C: Inaccessible home environment;Decreased caregiver support          Co-evaluation              End of Session Equipment Utilized During Treatment: Engineer, water Communication: Mobility status  Activity Tolerance: Patient tolerated treatment well Patient left: in bed;with call bell/phone within reach;with nursing/sitter in room   Time: 1035-1108 OT Time Calculation (min): 33 min Charges:  OT General Charges $OT Visit: 1 Procedure OT Evaluation $Initial OT Evaluation Tier I: 1 Procedure OT Treatments $Self Care/Home Management : 8-22 mins G-Codes:    Evern Bio 03/27/2015, 12:05 PM  [phone]

## 2015-03-27 NOTE — Progress Notes (Deleted)
[facility] PROGRESS NOTE  [name] AVW:[id number] DOB: 02/16/1966 DOA: 03/26/2015 PCP: Jacklynn BarnaclePLACEY,MARY H, NP  Assessment/Plan:  Right Upper Extremity Weakness -Patient presented to the ED 11/13 with complaints of dizziness and right upper extremity weakness of unknown chronicity  -CT head in the ED was negative, unable to obtain MRI due to anxiety despite IV Valium - he was transferred to [facility] for MRI under procedural sedation with anesthesia  -Echo performed 11/14 with EF 60-65%, grade I diastolic dysfunction, no wall motion abnormalities.  -Preliminary results of carotid duplex revealed no significant ICA stenosis, LE venous duplex with no evidence of DVT  -Lipid panel 11/14 with LDL 96, no indication for statin therapy -Pending results of MRI at this time, patient will likely be able to discharge if MRI is negative  -On exam patient found to have decreased ROM and stiffness of the RUE, R shoulder xray ordered to evaluate for MSK cause of weakness   HTN  -BPs stable since admission 120-140s -Continue Coreg, Lasix   Lymphedema  -Patient with chronic bilateral lower extremity lymphedema, stable and not worsening according to patient   Code Status: Full Code Family Communication: No family present at bedside Disposition Plan: TBD, likely home pending results of MRI   Consultants:  None  Procedures:  None  Antibiotics:  None   HPI/Subjective: [name] is a 49 y/o male with PMH of HTN, chronic lymphedema and OSA who presented to the ED 11/13 via EMS with complaints of dizziness. On exam he was noted to have right upper extremity weakness, the patient is a poor historian and therefore it was difficult to determine the patient's baseline or onset of symptoms. Despite sedation with IV valium the patient was unable to undergo MRI in the ED and was transferred to [facility] for MRI under sedation with anesthesia.   Objective: Filed Vitals:   03/27/15  1331  BP: 135/88  Pulse: 72  Temp: 98.4 F (36.9 C)  Resp: 20    Intake/Output Summary (Last 24 hours) at 03/27/15 1336 Last data filed at 03/27/15 [id number]  Gross per 24 hour  Intake      0 ml  Output   1725 ml  Net  -1725 ml   Filed Weights   03/26/15 2115  Weight: 123.968 kg (273 lb 4.8 oz)    Exam:   General:  Pleasant male, sitting upright in bed, NAD  Cardiovascular: RRR, no murmurs   Respiratory: Clear and equal bilaterally, no wheezes or rhonchi  Abdomen: Soft, non-tender, bowel sounds present all 4 quadrants  Musculoskeletal: Bilateral lower extremity lymphedema. Pedal pulses intact. 4/5 strength in RUE with decreased ROM. 5/5 strength in all other extremities.    Data Reviewed: Basic Metabolic Panel:  Recent Labs Lab 03/26/15 1632 03/26/15 1650  NA 141 141  K 4.1 4.3  CL 107 103  CO2 27  --   GLUCOSE 95 93  BUN 13 15  CREATININE 0.76 0.80  CALCIUM 8.5*  --    Liver Function Tests:  Recent Labs Lab 03/26/15 1632  AST 18  ALT 19  ALKPHOS 69  BILITOT 0.4  PROT 7.0  ALBUMIN 3.7   No results for input(s): LIPASE, AMYLASE in the last 168 hours. No results for input(s): AMMONIA in the last 168 hours. CBC:  Recent Labs Lab 03/26/15 1632 03/26/15 1650  WBC 6.6  --   NEUTROABS 4.0  --   HGB 12.3* 13.3  HCT 37.3* 39.0  MCV 88.2  --  PLT 273  --    Cardiac Enzymes: No results for input(s): CKTOTAL, CKMB, CKMBINDEX, TROPONINI in the last 168 hours. BNP (last 3 results)  Recent Labs  08/27/14 1800 03/26/15 1631  BNP 34.2 17.4    ProBNP (last 3 results) No results for input(s): PROBNP in the last 8760 hours.  CBG: No results for input(s): GLUCAP in the last 168 hours.  No results found for this or any previous visit (from the past 240 hour(s)).   Studies: Ct Head Wo Contrast  03/26/2015  CLINICAL DATA:  Increasing leg weakness EXAM: CT HEAD WITHOUT CONTRAST TECHNIQUE: Contiguous axial images were obtained from the base of the  skull through the vertex without intravenous contrast. COMPARISON:  12/05/2014 FINDINGS: The bony calvarium is intact. The ventricles are of normal size and configuration. No findings to suggest acute hemorrhage, acute infarction or space-occupying mass lesion are noted. IMPRESSION: No acute intracranial abnormality noted. Electronically Signed   By: Alcide Clever M.D.   On: 03/26/2015 16:33   Dg Chest Port 1 View  03/26/2015  CLINICAL DATA:  Lightheaded and week after sun exposure today. EXAM: PORTABLE CHEST 1 VIEW COMPARISON:  08/27/2014 and 04/05/2014 FINDINGS: Lungs are adequately inflated without consolidation or effusion. Cardiomediastinal silhouette is within normal. There are minimal degenerative changes of the spine. IMPRESSION: No active disease. Electronically Signed   By: Elberta Fortis M.D.   On: 03/26/2015 16:16   Mr Attempted Valora Corporal Report  03/26/2015  This examination belongs to an outside facility and is stored here for comparison purposes only.  Contact the originating outside institution for any associated report or interpretation.   Scheduled Meds: . aspirin  325 mg Oral Daily  . carvedilol  6.25 mg Oral BID WC  . enoxaparin (LOVENOX) injection  40 mg Subcutaneous Q24H  . furosemide  40 mg Oral BID   Continuous Infusions:   Principal Problem:   Weakness Active Problems:   Dizziness    Time spent: 35 minutes     Ishi Danser PA-S [facility] 03/27/2015, 1:36 PM  LOS: 1 day

## 2015-03-27 NOTE — Progress Notes (Signed)
  Echocardiogram 2D Echocardiogram has been performed.  Joshua SavoyCasey N Juvon Mccormick 03/27/2015, 10:31 AM

## 2015-03-27 NOTE — Care Management Note (Signed)
Case Management Note  Patient Details  Name: Shawna ClampRonald E Ericksen MRN: [id number] Date of Birth: 08/21/1965  Subjective/Objective:                    Action/Plan: Patient was admitted with weakness.  He is currently homeless and follows [facility] for [facility]/resources.  Patient is listed as self-pay and [facility] [employer]. Patient will be discharging today.  Expected Discharge Date:                  Expected Discharge Plan:  Homeless Shelter  In-House Referral:  Clinical Social Work, Chief Strategy Officerinancial Counselor  Discharge planning Services     Post Acute Care Choice:    Choice offered to:     DME Arranged:    DME Agency:     HH Arranged:    HH Agency:     Status of Service:  Completed, signed off  Medicare Important Message Given:    Date Medicare IM Given:    Medicare IM give by:    Date Additional Medicare IM Given:    Additional Medicare Important Message give by:     If discussed at Long Length of Stay Meetings, dates discussed:    Additional Comments:  Anda KraftRobarge, Tamantha Saline C, RN 03/27/2015, 2:18 PM

## 2015-03-27 NOTE — Progress Notes (Signed)
VASCULAR LAB PRELIMINARY  PRELIMINARY  PRELIMINARY  PRELIMINARY    Carotid Duplex = Bilateral: No significant (1-39%) ICA stenosis. Antegrade vertebral flow.   Lower extremity venous duplex = No evidence of DVT or baker's cyst.      Farrel DemarkJill Eunice, RDMS, RVT  03/27/2015, 11:59 AM

## 2015-03-27 NOTE — Discharge Summary (Signed)
Physician Discharge Summary  [name] OZD:[id number] DOB: 18-Jan-1966 DOA: 03/26/2015  PCP: Jacklynn Barnacle, NP  Admit date: 03/26/2015 Discharge date: 03/27/2015  Time spent: 30 minutes  Recommendations for Outpatient Follow-up:  1. Follow-up with PCP in 1 week for hospital follow-up   Discharge Diagnoses:  Principal Problem:   Weakness Active Problems:   Essential hypertension   Dizziness   Discharge Condition: Stable  Diet recommendation: Heart Healthy  Filed Weights   03/26/15 2115  Weight: 123.968 kg (273 lb 4.8 oz)    History of present illness:  [name] is a 49 y/o male with PMH of HTN, chronic lymphedema and OSA who presented to the ED 11/13 via EMS with complaints of dizziness. On exam he was noted to have right upper extremity weakness, the patient is a poor historian and therefore it was difficult to determine the patient's baseline or onset of symptoms. Despite sedation with IV valium the patient was unable to undergo MRI in the ED and was transferred to [facility] for MRI under sedation with anesthesia.    Hospital Course:   Right Upper Extremity Weakness -Patient presented to the [facility] 11/13 with complaints of dizziness and right upper extremity weakness of unknown chronicity  -CT head in the ED was negative, unable to obtain MRI due to anxiety despite IV Valium - he was transferred to [facility] for MRI under procedural sedation with anesthesia. Upon further discussion with the patient and review of the patient's chart, these symptoms have been ongoing for over one year and the patient had an MRI in November 2015 for the same complaints which was negative. The case was discussed with neurology who agree that there is no indication for repeat MRI at this time. -Echo performed 11/14 with EF 60-65%, grade I diastolic dysfunction, no wall motion abnormalities.  -Carotid duplex revealed no significant ICA stenosis, LE venous duplex with no evidence of DVT   -Lipid panel 11/14 with LDL 96, no indication for statin therapy -On exam patient found to have decreased ROM and stiffness of the RUE, R shoulder xray obtained with no acute abnormalities.  -PT/OT eval performed with recommendation for SNF placement upon discharge. Case was discussed with social work team and the patient is self-pay and is not a candidate for SNF.  -Patient is discharged home in stable condition and will follow-up with his PCP.    HTN  -BPs stable since admission 120-140s -Prescriptions given for Coreg and Lasix   Lymphedema  -Patient with chronic bilateral lower extremity lymphedema, stable and not worsening according to patient   Procedures:  Echo 11/14 - EF 60-65%, grade I diastolic dysfunction, no wall motion abnormalities  Consultations:  None  Discharge Exam: Filed Vitals:   03/27/15 1331  BP: 135/88  Pulse: 72  Temp: 98.4 F (36.9 C)  Resp: 20    General: Pleasant male, sitting upright in bed, NAD  Cardiovascular: RRR, no murmurs Respiratory: Clear and equal bilaterally, no wheezes or rhonchi Abdomen: Soft nontender nondistended Extremities: 3+ bilateral extremity pitting edema Neurological: 4/5 muscle strength to his right upper extremity  Discharge Instructions   Discharge Instructions    Call MD for:  difficulty breathing, headache or visual disturbances    Complete by:  As directed      Call MD for:  extreme fatigue    Complete by:  As directed      Call MD for:  hives    Complete by:  As directed  Call MD for:  persistant dizziness or light-headedness    Complete by:  As directed      Call MD for:  persistant nausea and vomiting    Complete by:  As directed      Call MD for:  redness, tenderness, or signs of infection (pain, swelling, redness, odor or green/yellow discharge around incision site)    Complete by:  As directed      Call MD for:  severe uncontrolled pain    Complete by:  As directed      Call MD for:  temperature  >100.4    Complete by:  As directed      Call MD for:    Complete by:  As directed      Diet - low sodium heart healthy    Complete by:  As directed      Increase activity slowly    Complete by:  As directed           Current Discharge Medication List    START taking these medications   Details  aspirin 325 MG tablet Take 1 tablet (325 mg total) by mouth daily. Qty: 30 tablet, Refills: 0      CONTINUE these medications which have CHANGED   Details  carvedilol (COREG) 6.25 MG tablet Take 1 tablet (6.25 mg total) by mouth 2 (two) times daily with a meal. Qty: 60 tablet, Refills: 0    furosemide (LASIX) 40 MG tablet Take 1 tablet (40 mg total) by mouth 2 (two) times daily. Qty: 14 tablet, Refills: 0      STOP taking these medications     potassium chloride SA (K-DUR,KLOR-CON) 20 MEQ tablet        No Known Allergies Follow-up Information    Follow up with PLACEY,MARY H, NP In 1 week.   Contact information:   [address] [place] [address] [phone]        The results of significant diagnostics from this hospitalization (including imaging, microbiology, ancillary and laboratory) are listed below for reference.    Significant Diagnostic Studies: Ct Head Wo Contrast  03/26/2015  CLINICAL DATA:  Increasing leg weakness EXAM: CT HEAD WITHOUT CONTRAST TECHNIQUE: Contiguous axial images were obtained from the base of the skull through the vertex without intravenous contrast. COMPARISON:  12/05/2014 FINDINGS: The bony calvarium is intact. The ventricles are of normal size and configuration. No findings to suggest acute hemorrhage, acute infarction or space-occupying mass lesion are noted. IMPRESSION: No acute intracranial abnormality noted. Electronically Signed   By: Alcide CleverMark  Lukens M.D.   On: 03/26/2015 16:33   Dg Chest Port 1 View  03/26/2015  CLINICAL DATA:  Lightheaded and week after sun exposure today. EXAM: PORTABLE CHEST 1 VIEW COMPARISON:  08/27/2014 and  04/05/2014 FINDINGS: Lungs are adequately inflated without consolidation or effusion. Cardiomediastinal silhouette is within normal. There are minimal degenerative changes of the spine. IMPRESSION: No active disease. Electronically Signed   By: Elberta Fortisaniel  Boyle M.D.   On: 03/26/2015 16:16   Mr Attempted Valora CorporalStudy-no Report  03/26/2015  This examination belongs to an outside facility and is stored here for comparison purposes only.  Contact the originating outside institution for any associated report or interpretation.   Microbiology: No results found for this or any previous visit (from the past 240 hour(s)).   Labs: Basic Metabolic Panel:  Recent Labs Lab 03/26/15 1632 03/26/15 1650  NA 141 141  K 4.1 4.3  CL 107 103  CO2 27  --  GLUCOSE 95 93  BUN 13 15  CREATININE 0.76 0.80  CALCIUM 8.5*  --    Liver Function Tests:  Recent Labs Lab 03/26/15 1632  AST 18  ALT 19  ALKPHOS 69  BILITOT 0.4  PROT 7.0  ALBUMIN 3.7   No results for input(s): LIPASE, AMYLASE in the last 168 hours. No results for input(s): AMMONIA in the last 168 hours. CBC:  Recent Labs Lab 03/26/15 1632 03/26/15 1650  WBC 6.6  --   NEUTROABS 4.0  --   HGB 12.3* 13.3  HCT 37.3* 39.0  MCV 88.2  --   PLT 273  --    Cardiac Enzymes: No results for input(s): CKTOTAL, CKMB, CKMBINDEX, TROPONINI in the last 168 hours. BNP: BNP (last 3 results)  Recent Labs  08/27/14 1800 03/26/15 1631  BNP 34.2 17.4    ProBNP (last 3 results) No results for input(s): PROBNP in the last 8760 hours.  CBG: No results for input(s): GLUCAP in the last 168 hours.     Signed:  Shirlyn Goltz PA-S  [facility] 03/27/2015, 2:23 PM  Addendum  I personally evaluated patient on 03/27/2015 and agree with above findings. [name] is a pleasant 49 year old gentleman with a past medical history of tobacco abuse, hypertension, chronic lower back pain, chronic bilateral extremity lymphedema who  presented overnight with complaints of dizziness. He was noted to have right upper extremity weakness. Further workup with MRI was attempted however patient did not tolerate MRI. Upon further questioning on the following day patient reported that he has had weakness involving his right upper extremity over the past year. He was admitted to the medicine service back in November 2015 for right upper extremity weakness at which time he was evaluated by neurology. An MRI of brain done that did not show evidence of acute CVA. Since right arm weakness had not changed in the past year I do not feel an MRI was warranted. He had a transthoracic echocardiogram that showed an EF of 6065% with grade 1 diastolic dysfunction. Bilateral carotids showed 1-39% ICA stenosis. On physical examination he had 4 out of 5 muscle strength to his right upper extremity. I further worked him up with a two-view right shoulder that did not reveal acute osseous abnormalities. Patient was discharged from the hospital today and advised to follow-up with his primary care physician within one week.

## 2015-03-27 NOTE — Evaluation (Signed)
Physical Therapy Evaluation Patient Details Name: [name] MRN: [id number] DOB: 14-Feb-1966 Today's Date: 03/27/2015   History of Present Illness  Pt is a 49 y/o male with a PMH significant for HTN, chronic lymphedema, OSA. Pt presents with R sided weakness and pain, which pt reports has been ongoing for over a year but has recently worsened. MRI in Dec 2015 was negative for stroke.   Clinical Impression  Pt admitted with above diagnosis. Pt currently with functional limitations due to the deficits listed below (see PT Problem List). At the time of PT eval pt was able to perform transfers and ambulation with min to mod assistance. Pt is grossly weaker on his right side with numbness and pain reported in B feet.   Pt will benefit from skilled PT to increase their independence and safety with mobility to allow discharge to the venue listed below. Pt refusing shelter or community services, however is willing to go to SNF for further therapies prior to being out on his own.      Follow Up Recommendations SNF;Supervision for mobility/OOB    Equipment Recommendations  Wheelchair (measurements PT);Wheelchair cushion (measurements PT)    Recommendations for Other Services       Precautions / Restrictions Precautions Precautions: Fall Precaution Comments: Very unsteady on his feet; poor trunk control Restrictions Weight Bearing Restrictions: No      Mobility  Bed Mobility Overal bed mobility: Needs Assistance Bed Mobility: Supine to Sit;Sit to Supine     Supine to sit: Min guard Sit to supine: Mod assist   General bed mobility comments: Pt able to transition to EOB with increased time and heavy use of bed rails. Pt struggled to achieve EOB and many small scoots were utilized to get feet on floor. Pt required mod assist to elevate LE's back onto bed to return to supine.   Transfers Overall transfer level: Needs assistance Equipment used: 4-wheeled walker Transfers: Sit  to/from Stand Sit to Stand: Mod assist         General transfer comment: Pt with difficulty standing from bed in lowest position. Attempted to scoot forward and use momentum to stand however slid off EOB and required mod assist to recover. Bed height was elevated and pt was able to achieve standing with mod assist.  Ambulation/Gait Ambulation/Gait assistance: Min assist Ambulation Distance (Feet): 25 Feet Assistive device: 4-wheeled walker Gait Pattern/deviations: Step-to pattern;Decreased stride length;Trunk flexed;Wide base of support Gait velocity: Decreased Gait velocity interpretation: Below normal speed for age/gender General Gait Details: Pt ambulated in room to bathroom and back to bed. Pt required assist for walker management as well as steadying assist. Overall appears very unteady on his feet and difficulty taking steps.   Stairs            Wheelchair Mobility    Modified Rankin (Stroke Patients Only)       Balance Overall balance assessment: Needs assistance Sitting-balance support: Feet supported;Bilateral upper extremity supported Sitting balance-Leahy Scale: Poor Sitting balance - Comments: Pt unable to maintain static sitting for longer than 5-10 seconds without demonstrating posterior lean which puts pt at risk for sliding off edge of bed.  Postural control: Posterior lean Standing balance support: Bilateral upper extremity supported Standing balance-Leahy Scale: Poor Standing balance comment: Required UE support during static standing while in bathroom at toilet.  Pertinent Vitals/Pain Pain Assessment: No/denies pain    Home Living Family/patient expects to be discharged to:: Shelter/Homeless     Type of Home: Homeless                Prior Function Level of Independence: Independent with assistive device(s)         Comments: Has rollator in room     Hand Dominance   Dominant Hand: Right     Extremity/Trunk Assessment   Upper Extremity Assessment: RUE deficits/detail RUE Deficits / Details: Decreased gross strength and pt reports difficulty holding utensils. Defer to OT for more in-depth assessment.         Lower Extremity Assessment: RLE deficits/detail;Generalized weakness RLE Deficits / Details: MMT only completed in hip flexors with decreased strength in R noted. Pt with lymphedema in B lower legs and quads/hams testing not completed due to pain. At least 3/5 grossly with functional tasks.    Cervical / Trunk Assessment: Normal  Communication   Communication: No difficulties  Cognition Arousal/Alertness: Awake/alert Behavior During Therapy: WFL for tasks assessed/performed Overall Cognitive Status: History of cognitive impairments - at baseline (h/o cognitive deficits, pt reports this to be baseline)                      General Comments General comments (skin integrity, edema, etc.): Fatigues quickly    Exercises        Assessment/Plan    PT Assessment Patient needs continued PT services  PT Diagnosis Difficulty walking   PT Problem List Decreased strength;Decreased range of motion;Decreased activity tolerance;Decreased balance;Decreased mobility;Decreased knowledge of use of DME;Decreased coordination;Decreased safety awareness;Decreased knowledge of precautions;Decreased cognition;Impaired sensation  PT Treatment Interventions DME instruction;Gait training;Stair training;Functional mobility training;Therapeutic activities;Therapeutic exercise;Neuromuscular re-education;Patient/family education   PT Goals (Current goals can be found in the Care Plan section) Acute Rehab PT Goals Patient Stated Goal: Get stronger before being back out on his own PT Goal Formulation: With patient Time For Goal Achievement: 04/10/15 Potential to Achieve Goals: Fair    Frequency Min 3X/week   Barriers to discharge Other (comment) Homeless    Co-evaluation                End of Session Equipment Utilized During Treatment: Gait belt Activity Tolerance: Patient limited by fatigue Patient left: in chair;with call bell/phone within reach;with bed alarm set Nurse Communication: Mobility status         Time: 0840-0907 PT Time Calculation (min) (ACUTE ONLY): 27 min   Charges:   PT Evaluation $Initial PT Evaluation Tier I: 1 Procedure PT Treatments $Gait Training: 8-22 mins   PT G Codes:        Conni SlipperKirkman, Caleigha Zale 03/27/2015, 9:41 AM   Conni SlipperLaura Takeyla Million, PT, DPT Acute Rehabilitation Services Pager: [phone]

## 2015-03-27 NOTE — Progress Notes (Signed)
Discharge orders received. Pt educated on discharge instructions and verbalized understanding. Pt given discharge packet and prescriptions. Pt asked about getting assistance affording his medications. This RN paged Case Management and the Case Manager informed this RN that the patient has an [employer] for his medications. The patient confirmed this and was notified that he can use that for his medications. IV and tele removed. Taxi voucher supplied by Child psychotherapistocial Worker. This RN called taxi company. Pt and belongings taken downstairs by staff via wheelchair.
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
## 2015-03-28 LAB — HEMOGLOBIN A1C
HEMOGLOBIN A1C: 5.9 % — AB (ref 4.8–5.6)
MEAN PLASMA GLUCOSE: 123 mg/dL

## 2015-03-28 NOTE — Progress Notes (Signed)
Physical Therapy Evaluation Addendum for G-Codes    03/27/15 0844  PT G-Codes **NOT FOR INPATIENT CLASS**  Functional Assessment Tool Used Clinical judgement  Functional Limitation Mobility: Walking and moving around  Mobility: Walking and Moving Around Current Status [phone](G8978) CJ  Mobility: Walking and Moving Around Goal Status [phone](G8979) CJ    Conni SlipperLaura Ezell Melikian, PT, DPT Acute Rehabilitation Services Pager: [phone]

## 2015-03-29 NOTE — Progress Notes (Signed)
Late entry due to missed g-code.   03/29/15 1500  OT G-codes **NOT FOR INPATIENT CLASS**  Functional Assessment Tool Used clinical judgement  Functional Limitation Self care  Self Care Current Status [phone](G8987) CL  Self Care Goal Status ([phone](G8988) CI  03/29/2015 Martie RoundJulie Reizel Calzada, OTR/L Pager: [phone]

## 2015-03-29 NOTE — Progress Notes (Signed)
   03/29/15 0700  SLP G-Codes **NOT FOR INPATIENT CLASS**  Functional Assessment Tool Used clinical judgement  Functional Limitations Spoken language expressive  Spoken Language Comprehension Current Status [phone](G9159) [facility]  Spoken Language Comprehension Goal Status [phone](G9160) [facility]  Spoken Language Comprehension Discharge Status [phone](G9161) [facility]  Donavan Burnetamara Dimond Crotty, MS [facility] SLP [phone]

## 2015-04-06 ENCOUNTER — Emergency Department (HOSPITAL_COMMUNITY)
Admission: EM | Admit: 2015-04-06 | Discharge: 2015-04-06 | Disposition: A | Discharge status: Home / Self Care | Payer: Medicaid Other | Attending: Emergency Medicine | Admitting: Emergency Medicine

## 2015-04-06 ENCOUNTER — Emergency Department (HOSPITAL_COMMUNITY): Payer: Medicaid Other

## 2015-04-06 DIAGNOSIS — F17210 Nicotine dependence, cigarettes, uncomplicated: Secondary | ICD-10-CM | POA: Insufficient documentation

## 2015-04-06 DIAGNOSIS — Z87828 Personal history of other (healed) physical injury and trauma: Secondary | ICD-10-CM | POA: Insufficient documentation

## 2015-04-06 DIAGNOSIS — R2243 Localized swelling, mass and lump, lower limb, bilateral: Secondary | ICD-10-CM | POA: Diagnosis not present

## 2015-04-06 DIAGNOSIS — Z7982 Long term (current) use of aspirin: Secondary | ICD-10-CM | POA: Diagnosis not present

## 2015-04-06 DIAGNOSIS — R42 Dizziness and giddiness: Secondary | ICD-10-CM

## 2015-04-06 DIAGNOSIS — Z590 Homelessness: Secondary | ICD-10-CM | POA: Insufficient documentation

## 2015-04-06 DIAGNOSIS — R531 Weakness: Secondary | ICD-10-CM

## 2015-04-06 DIAGNOSIS — I10 Essential (primary) hypertension: Secondary | ICD-10-CM | POA: Insufficient documentation

## 2015-04-06 DIAGNOSIS — R000 Tachycardia, unspecified: Secondary | ICD-10-CM | POA: Diagnosis not present

## 2015-04-06 DIAGNOSIS — Z79899 Other long term (current) drug therapy: Secondary | ICD-10-CM | POA: Diagnosis not present

## 2015-04-06 LAB — CBC WITH DIFFERENTIAL/PLATELET
BASOS ABS: 0 10*3/uL (ref 0.0–0.1)
BASOS PCT: 0 %
EOS PCT: 1 %
Eosinophils Absolute: 0.1 10*3/uL (ref 0.0–0.7)
HEMATOCRIT: 37.6 % — AB (ref 39.0–52.0)
Hemoglobin: 12.4 g/dL — ABNORMAL LOW (ref 13.0–17.0)
LYMPHS PCT: 18 %
Lymphs Abs: 1.3 10*3/uL (ref 0.7–4.0)
MCH: 29.5 pg (ref 26.0–34.0)
MCHC: 33 g/dL (ref 30.0–36.0)
MCV: 89.5 fL (ref 78.0–100.0)
MONO ABS: 0.5 10*3/uL (ref 0.1–1.0)
Monocytes Relative: 7 %
NEUTROS ABS: 5.4 10*3/uL (ref 1.7–7.7)
Neutrophils Relative %: 74 %
PLATELETS: 282 10*3/uL (ref 150–400)
RBC: 4.2 MIL/uL — AB (ref 4.22–5.81)
RDW: 15.6 % — AB (ref 11.5–15.5)
WBC: 7.3 10*3/uL (ref 4.0–10.5)

## 2015-04-06 LAB — I-STAT TROPONIN, ED: Troponin i, poc: 0 ng/mL (ref 0.00–0.08)

## 2015-04-06 LAB — BASIC METABOLIC PANEL
ANION GAP: 9 (ref 5–15)
BUN: 12 mg/dL (ref 6–20)
CO2: 25 mmol/L (ref 22–32)
CREATININE: 0.89 mg/dL (ref 0.61–1.24)
Calcium: 8.5 mg/dL — ABNORMAL LOW (ref 8.9–10.3)
Chloride: 106 mmol/L (ref 101–111)
GFR calc non Af Amer: >60 mL/min (ref >60)
Glucose, Bld: 105 mg/dL — ABNORMAL HIGH (ref 65–99)
POTASSIUM: 3.9 mmol/L (ref 3.5–5.1)
Sodium: 140 mmol/L (ref 135–145)
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
## 2015-04-06 NOTE — Discharge Instructions (Signed)
1. Medications: usual home medications 2. Treatment: rest, drink plenty of fluids 3. Follow Up: please followup with your primary doctor for discussion of your diagnoses and further evaluation after today's visit; if you do not have a primary care doctor use the resource guide provided to find one; please return to the ER for new or worsening symptoms   Dizziness Dizziness is a common problem. It is a feeling of unsteadiness or light-headedness. You may feel like you are about to faint. Dizziness can lead to injury if you stumble or fall. Anyone can become dizzy, but dizziness is more common in older adults. This condition can be caused by a number of things, including medicines, dehydration, or illness. HOME CARE INSTRUCTIONS Taking these steps may help with your condition: Eating and Drinking  Drink enough fluid to keep your urine clear or pale yellow. This helps to keep you from becoming dehydrated. Try to drink more clear fluids, such as water.  Do not drink alcohol.  Limit your caffeine intake if directed by your health care provider.  Limit your salt intake if directed by your health care provider. Activity  Avoid making quick movements.  Rise slowly from chairs and steady yourself until you feel okay.  In the morning, first sit up on the side of the bed. When you feel okay, stand slowly while you hold onto something until you know that your balance is fine.  Move your legs often if you need to stand in one place for a long time. Tighten and relax your muscles in your legs while you are standing.  Do not drive or operate heavy machinery if you feel dizzy.  Avoid bending down if you feel dizzy. Place items in your home so that they are easy for you to reach without leaning over. Lifestyle  Do not use any tobacco products, including cigarettes, chewing tobacco, or electronic cigarettes. If you need help quitting, ask your health care provider.  Try to reduce your stress level,  such as with yoga or meditation. Talk with your health care provider if you need help. General Instructions  Watch your dizziness for any changes.  Take medicines only as directed by your health care provider. Talk with your health care provider if you think that your dizziness is caused by a medicine that you are taking.  Tell a friend or a family member that you are feeling dizzy. If he or she notices any changes in your behavior, have this person call your health care provider.  Keep all follow-up visits as directed by your health care provider. This is important. SEEK MEDICAL CARE IF:  Your dizziness does not go away.  Your dizziness or light-headedness gets worse.  You feel nauseous.  You have reduced hearing.  You have new symptoms.  You are unsteady on your feet or you feel like the room is spinning. SEEK IMMEDIATE MEDICAL CARE IF:  You vomit or have diarrhea and are unable to eat or drink anything.  You have problems talking, walking, swallowing, or using your arms, hands, or legs.  You feel generally weak.  You are not thinking clearly or you have trouble forming sentences. It may take a friend or family member to notice this.  You have chest pain, abdominal pain, shortness of breath, or sweating.  Your vision changes.  You notice any bleeding.  You have a headache.  You have neck pain or a stiff neck.  You have a fever.   This information is not intended  to replace advice given to you by your health care provider. Make sure you discuss any questions you have with your health care provider.   Document Released: 10/23/2000 Document Revised: 09/13/2014 Document Reviewed: 04/25/2014 Elsevier Interactive Patient Education 2016 Elsevier Inc.  Weakness Weakness is a lack of strength. It may be felt all over the body (generalized) or in one specific part of the body (focal). Some causes of weakness can be serious. You may need further medical evaluation,  especially if you are elderly or you have a history of immunosuppression (such as chemotherapy or HIV), kidney disease, heart disease, or diabetes. CAUSES  Weakness can be caused by many different things, including:  Infection.  Physical exhaustion.  Internal bleeding or other blood loss that results in a lack of red blood cells (anemia).  Dehydration. This cause is more common in elderly people.  Side effects or electrolyte abnormalities from medicines, such as pain medicines or sedatives.  Emotional distress, anxiety, or depression.  Circulation problems, especially severe peripheral arterial disease.  Heart disease, such as rapid atrial fibrillation, bradycardia, or heart failure.  Nervous system disorders, such as Guillain-Barr syndrome, multiple sclerosis, or stroke. DIAGNOSIS  To find the cause of your weakness, your caregiver will take your history and perform a physical exam. Lab tests or X-rays may also be ordered, if needed. TREATMENT  Treatment of weakness depends on the cause of your symptoms and can vary greatly. HOME CARE INSTRUCTIONS   Rest as needed.  Eat a well-balanced diet.  Try to get some exercise every day.  Only take over-the-counter or prescription medicines as directed by your caregiver. SEEK MEDICAL CARE IF:   Your weakness seems to be getting worse or spreads to other parts of your body.  You develop new aches or pains. SEEK IMMEDIATE MEDICAL CARE IF:   You cannot perform your normal daily activities, such as getting dressed and feeding yourself.  You cannot walk up and down stairs, or you feel exhausted when you do so.  You have shortness of breath or chest pain.  You have difficulty moving parts of your body.  You have weakness in only one area of the body or on only one side of the body.  You have a fever.  You have trouble speaking or swallowing.  You cannot control your bladder or bowel movements.  You have black or bloody  vomit or stools. MAKE SURE YOU:  Understand these instructions.  Will watch your condition.  Will get help right away if you are not doing well or get worse.   This information is not intended to replace advice given to you by your health care provider. Make sure you discuss any questions you have with your health care provider.   Document Released: 04/29/2005 Document Revised: 10/29/2011 Document Reviewed: 06/28/2011 Elsevier Interactive Patient Education 2016 [employer].    Emergency Department Resource Guide 1) Find a Doctor and Pay Out of Pocket Although you won't have to find out who is covered by your insurance plan, it is a good idea to ask around and get recommendations. You will then need to call the office and see if the doctor you have chosen will accept you as a new patient and what types of options they offer for patients who are self-pay. Some doctors offer discounts or will set up payment plans for their patients who do not have insurance, but you will need to ask so you aren't surprised when you get to your appointment.  2)  Contact Your Local Health Department Not all health departments have doctors that can see patients for sick visits, but many do, so it is worth a call to see if yours does. If you don't know where your local health department is, you can check in your phone book. The CDC also has a tool to help you locate your state's health department, and many state websites also have listings of all of their local health departments.  3) Find a Walk-in Clinic If your illness is not likely to be very severe or complicated, you may want to try a walk in clinic. These are popping up all over the country in pharmacies, drugstores, and shopping centers. They're usually staffed by nurse practitioners or physician assistants that have been trained to treat common illnesses and complaints. They're usually fairly quick and inexpensive. However, if you have serious medical  issues or chronic medical problems, these are probably not your best option.  No Primary Care Doctor: - Call Health Connect at  [phone] - they can help you locate a primary care doctor that  accepts your insurance, provides certain services, etc. - Physician Referral Service- [phone]  Chronic Pain Problems: Organization         Address  Phone   Notes  [facility]  [phone] Patients need to be referred by their primary care doctor.   Medication Assistance: Organization         Address  Phone   Notes  [facility] [address] [place], [place] [address] [phone] --Must be a resident of [facility] -- Must have NO insurance coverage whatsoever (no Medicaid/ Medicare, etc.) -- The pt. MUST have a primary care doctor that directs their care regularly and follows them in the community   [facility]  [phone]   [employer]  [phone]    Agencies that provide inexpensive medical care: Organization         Address  Phone   Notes  [facility]  [phone]   [facility]    [phone]   [facility] [address] [place], [place] [address] [phone]   [facility] [address] [place], [place] [phone]   Planned Parenthood    [phone]   [facility]    [phone]   [facility]  [address], [place] Phone:  [phone], Fax:  [phone] Hours of Operation:  9 am - 6 pm, M-F.  Also accepts Medicaid/Medicare and self-pay.  [facility]  [address], [place] Phone: [phone], Fax: [phone]. Hours of Operation:  8:30 am - 5:30 pm, M-F.  Also accepts Medicaid and self-pay.  [facility] [place] [address], [place] Phone: [phone]   [facility] [address] Natasha Bence [place], [place]  [phone], Ext. [phone] Mondays & Thursdays: 7-9 AM.  First 15 patients are seen on a first come, first serve basis.    Medicaid-accepting [facility] Providers:  Organization         Address  Phone   Notes  [facility] [address], [place] [phone] Also accepts self-pay patients.  [facility] [address] Laurell [address], [place]  [phone]   [facility] [address],  [place] [phone]) [phone]   [facility] [address] [place] [address], [place] [phone]) [phone]   Renaye RakersVeita Bland [address], [place]   [phone] Only accepts [place] Access [place] patients after they have their name applied to their card.   Self-Pay (no insurance) in [facility]:  Organization         Address  Phone   Notes  Sickle Cell Patients, [facility] [address], [place] [phone]   [facility] [address], [place] [phone]   [facility]  [address], [place] [phone]) [phone]   [facility]/Dr. Osei-Bonsu  [address] [place] [address], [place] or [phone], [place] [phone] Phone number for both [place] and [place] locations is the same.  [facility] [address], [place] [phone]) [phone]   [facility] [place] [address] [place] [address], [place] or [address] [phone]   [facility] [address], [place] [phone]) [phone], phone; [phone](336) [phone], fax Sees patients 1st and 3rd Saturday of every month.  Must not qualify for public or private insurance (i.e. Medicaid, Medicare, [place] Health Choice, Veterans' Benefits)  Household income should be no more than 200% of the poverty level The clinic cannot treat you if you are pregnant or think you are pregnant  Sexually transmitted  diseases are not treated at the clinic.    Dental Care: Organization         Address  Phone  Notes  [facility] [address], [place] [phone]) [phone] Accepts children up to age 49 who are enrolled in [place] or [place] Health Choice; pregnant women with a Medicaid card; and children who have applied for Medicaid or [place] Health Choice, but were declined, whose parents can pay a reduced fee at time of service.  [facility] [place]  [address], [place] [phone] Accepts children up to age 49 who are enrolled in [place] or [place] Health Choice; pregnant women with a Medicaid card; and children who have applied for Medicaid or [place] Health Choice, but were declined, whose parents can pay a reduced fee at time of service.  [facility]  [address], [place] [phone] Patients are seen by appointment only. Walk-ins are not accepted. [facility] will see patients 49 years of age and older. Monday - Tuesday (8am-5pm) Most Wednesdays (8:30-5pm) $30 per visit, cash only  [facility]  [address], [facility] [phone] Patients are seen by appointment only. Walk-ins are not accepted. [facility] will see patients 49 years of age and older. One Wednesday Evening (Monthly: Volunteer Based).  $30 per visit, cash only  [facility] [facility]  [phone] for adults; Children under age 274, call Graduate Pediatric Dentistry at [phone]. Children aged 364-14, please call [phone] to request a pediatric application.  Dental services are provided in all areas of dental care including fillings, crowns and bridges, complete and partial dentures, implants, gum treatment, root canals, and extractions. Preventive care is also provided. Treatment is provided to both adults and children. Patients are selected via a  lottery and there is often a waiting list.   [facility] [address], [place]  [phone] [email or web address]   [facility] [address] [place], [place] [phone], Ext. [phone] Second and Fourth Thursday of each month, opens at 6:30 AM; Clinic ends at 9 AM.  Patients are seen on a first-come first-served basis, and a limited number are seen during each clinic.   [facility]  [address] Ether Griffins [place], [place] [phone]   Eligibility Requirements You must have lived in [place], [place], or [place] counties for at least the last three months.   You cannot be eligible for state or federal sponsored [place], including [employer], [place], or [employer].   You generally cannot be eligible for healthcare insurance through your employer.    How to apply: Eligibility screenings are held every Tuesday and Wednesday afternoon from 1:00 pm until 4:00 pm. You do not need an appointment for the interview!  [facility] [address] [place], [place] [phone]   [facility] Health Department  [phone]   [facility] Health Department  [phone]   [facility] Health Department  [phone]    Behavioral Health Resources in the Community: Intensive Outpatient Programs Organization         Address  Phone  Notes  [facility] [address] [place], [place] [phone]   [facility] Outpatient [address] [place], [place] [phone]   [facility] [address] [place], [place]  [phone]   [facility] Mental Health [address],  [place], [place] [phone] or [phone]   Substance Abuse Resources Organization         Address  Phone  Notes  [facility] Drug Services  [phone]   [facility]  [phone]   [facility] [place]  [phone]   Floydene Flock  [phone]   Residential &  Outpatient Substance Abuse Program  [phone]   Psychological Services Organization         Address  Phone  Notes  [facility] Behavioral Health  [phone]   [facility] Services  [phone]   [facility] Mental Health [address], [place] [phone] or [phone]    Mobile Crisis Teams Organization         Address  Phone  Notes  [facility], Mobile Crisis Care Unit  [phone]   [facility]  [address] [place], [place] [phone]   Doristine Locks [address] [place] [phone]    Self-Help/Support Groups Organization         Address  Phone             Notes  [facility] [place] - variety of support groups  336- I7437963 Call for more information  Narcotics Anonymous (NA), [facility] [address], [employer] [place]  2 meetings at this location   Statistician         Address  Phone  Notes  [facility] [address],    [place]  [phone]   [facility]  [address] [place] [address], [place], [place] [phone]   [facility] [address] [place], [place] [phone] Admissions: 8am-3pm M-F  [facility] [address]    [place], [place] [phone]   [facility] [address] Starling Manns [place], [place] [phone]   [facility] [address]  [place], [place] [phone]   [facility] -  Intensive Outpatient [address] Laurell [address], [place], [place] [phone]   [facility] ([facility] [address] [place].,  [place], [place] [phone] or [phone]   [facility] ([facility]) [address], [place], [place] [phone] Accepts Medicaid  [facility] [place] [address]  [place] [phone] Substance Abuse/Addiction Treatment   [facility] Organization          Address  Phone  Notes  [facility]  [phone]   Angie Fava, PhD [address] Ervin Knack [place], [place]   [phone] or [phone]   [facility]   [address] [place], [place] [phone]   [facility] [address] [place], [place] [phone] Insurance/Medicaid/sponsorship through [facility] and Families [address]                                    [place], [place] [phone] Therapy/tele-psych/case  [facility] [address], [place] [phone]    Dr. Lolly Mustache  [phone]   [facility]  [employer] [facility] Dept. 1) [address] [place] 2) [address] [place] 3)  [address], [place] [phone]  [phone]   [facility] Child Abuse Hotline 928-368-9281 or [phone] (After Hours)

## 2015-04-06 NOTE — ED Notes (Signed)
Pt refused to sign and refused discharge vital signs. Pt was escorted to the lobby. He ambulated without assistance from staff, with his walker

## 2015-04-06 NOTE — ED Notes (Signed)
Per ems pt is homeless, was at thanksgiving dinner at shelter and pt decided he wanted to call 911, because he has lost his ability walk today. Edema to legs, reports he has not been consistently taking lasix. Lung sounds clear. Denies SOB.   20 g L forearm.  Temp 101.3, given 1000 mg tylenol PO

## 2015-04-06 NOTE — ED Notes (Signed)
Bed: [id number] Expected date:  Expected time:  Means of arrival:  Comments: Bilateral leg pain

## 2015-04-19 ENCOUNTER — Emergency Department (HOSPITAL_COMMUNITY)
Admission: EM | Admit: 2015-04-19 | Discharge: 2015-04-19 | Disposition: A | Discharge status: Home / Self Care | Payer: Medicaid Other | Attending: Emergency Medicine | Admitting: Emergency Medicine

## 2015-04-19 ENCOUNTER — Encounter (HOSPITAL_COMMUNITY): Payer: Self-pay

## 2015-04-19 DIAGNOSIS — Y9289 Other specified places as the place of occurrence of the external cause: Secondary | ICD-10-CM | POA: Diagnosis not present

## 2015-04-19 DIAGNOSIS — Z7982 Long term (current) use of aspirin: Secondary | ICD-10-CM | POA: Insufficient documentation

## 2015-04-19 DIAGNOSIS — Z043 Encounter for examination and observation following other accident: Secondary | ICD-10-CM | POA: Insufficient documentation

## 2015-04-19 DIAGNOSIS — Z79899 Other long term (current) drug therapy: Secondary | ICD-10-CM | POA: Diagnosis not present

## 2015-04-19 DIAGNOSIS — I10 Essential (primary) hypertension: Secondary | ICD-10-CM | POA: Diagnosis present

## 2015-04-19 DIAGNOSIS — W010XXA Fall on same level from slipping, tripping and stumbling without subsequent striking against object, initial encounter: Secondary | ICD-10-CM | POA: Insufficient documentation

## 2015-04-19 DIAGNOSIS — Z87828 Personal history of other (healed) physical injury and trauma: Secondary | ICD-10-CM | POA: Diagnosis not present

## 2015-04-19 DIAGNOSIS — F17210 Nicotine dependence, cigarettes, uncomplicated: Secondary | ICD-10-CM | POA: Diagnosis not present

## 2015-04-19 DIAGNOSIS — Y9301 Activity, walking, marching and hiking: Secondary | ICD-10-CM | POA: Insufficient documentation

## 2015-04-19 DIAGNOSIS — Y998 Other external cause status: Secondary | ICD-10-CM | POA: Insufficient documentation

## 2015-04-19 MED ORDER — FUROSEMIDE 40 MG PO TABS: 40.0000 mg | ORAL_TABLET | Freq: Two times a day (BID) | ORAL | Status: DC

## 2015-04-19 MED ORDER — CARVEDILOL 6.25 MG PO TABS
6.2500 mg | ORAL_TABLET | Freq: Two times a day (BID) | ORAL | Status: DC
  Filled 2015-04-19 (×3): qty 1

## 2015-04-19 MED ORDER — CARVEDILOL 6.25 MG PO TABS: 6.2500 mg | ORAL_TABLET | Freq: Two times a day (BID) | ORAL | Status: DC

## 2015-04-19 MED ORDER — FUROSEMIDE 40 MG PO TABS
40.0000 mg | ORAL_TABLET | Freq: Once | ORAL | Status: AC
  Administered 2015-04-19: 40 mg via ORAL
  Filled 2015-04-19: qty 1
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
## 2015-04-19 NOTE — Discharge Instructions (Signed)
Hypertension Hypertension, commonly called high blood pressure, is when the force of blood pumping through your arteries is too strong. Your arteries are the blood vessels that carry blood from your heart throughout your body. A blood pressure reading consists of a higher number over a lower number, such as 110/72. The higher number (systolic) is the pressure inside your arteries when your heart pumps. The lower number (diastolic) is the pressure inside your arteries when your heart relaxes. Ideally you want your blood pressure below 120/80. Hypertension forces your heart to work harder to pump blood. Your arteries may become narrow or stiff. Having untreated or uncontrolled hypertension can cause heart attack, stroke, kidney disease, and other problems. RISK FACTORS Some risk factors for high blood pressure are controllable. Others are not.  Risk factors you cannot control include:   Race. You may be at higher risk if you are African American.  Age. Risk increases with age.  Gender. Men are at higher risk than women before age 45 years. After age 65, women are at higher risk than men. Risk factors you can control include:  Not getting enough exercise or physical activity.  Being overweight.  Getting too much fat, sugar, calories, or salt in your diet.  Drinking too much alcohol. SIGNS AND SYMPTOMS Hypertension does not usually cause signs or symptoms. Extremely high blood pressure (hypertensive crisis) may cause headache, anxiety, shortness of breath, and nosebleed. DIAGNOSIS To check if you have hypertension, your health care provider will measure your blood pressure while you are seated, with your arm held at the level of your heart. It should be measured at least twice using the same arm. Certain conditions can cause a difference in blood pressure between your right and left arms. A blood pressure reading that is higher than normal on one occasion does not mean that you need treatment. If  it is not clear whether you have high blood pressure, you may be asked to return on a different day to have your blood pressure checked again. Or, you may be asked to monitor your blood pressure at home for 1 or more weeks. TREATMENT Treating high blood pressure includes making lifestyle changes and possibly taking medicine. Living a healthy lifestyle can help lower high blood pressure. You may need to change some of your habits. Lifestyle changes may include:  Following the DASH diet. This diet is high in fruits, vegetables, and whole grains. It is low in salt, red meat, and added sugars.  Keep your sodium intake below 2,300 mg per day.  Getting at least 30-45 minutes of aerobic exercise at least 4 times per week.  Losing weight if necessary.  Not smoking.  Limiting alcoholic beverages.  Learning ways to reduce stress. Your health care provider may prescribe medicine if lifestyle changes are not enough to get your blood pressure under control, and if one of the following is true:  You are 18-59 years of age and your systolic blood pressure is above 140.  You are 60 years of age or older, and your systolic blood pressure is above 150.  Your diastolic blood pressure is above 90.  You have diabetes, and your systolic blood pressure is over 140 or your diastolic blood pressure is over 90.  You have kidney disease and your blood pressure is above 140/90.  You have heart disease and your blood pressure is above 140/90. Your personal target blood pressure may vary depending on your medical conditions, your age, and other factors. HOME CARE INSTRUCTIONS    Have your blood pressure rechecked as directed by your health care provider.   Take medicines only as directed by your health care provider. Follow the directions carefully. Blood pressure medicines must be taken as prescribed. The medicine does not work as well when you skip doses. Skipping doses also puts you at risk for  problems.  Do not smoke.   Monitor your blood pressure at home as directed by your health care provider. SEEK MEDICAL CARE IF:   You think you are having a reaction to medicines taken.  You have recurrent headaches or feel dizzy.  You have swelling in your ankles.  You have trouble with your vision. SEEK IMMEDIATE MEDICAL CARE IF:  You develop a severe headache or confusion.  You have unusual weakness, numbness, or feel faint.  You have severe chest or abdominal pain.  You vomit repeatedly.  You have trouble breathing. MAKE SURE YOU:   Understand these instructions.  Will watch your condition.  Will get help right away if you are not doing well or get worse.   This information is not intended to replace advice given to you by your health care provider. Make sure you discuss any questions you have with your health care provider.   Document Released: 04/29/2005 Document Revised: 09/13/2014 Document Reviewed: 02/19/2013 Elsevier Interactive Patient Education 2016 Elsevier Inc.  

## 2015-04-19 NOTE — ED Notes (Signed)
[facility] EMS was initially called for fall after tripping while walking out of a friends house - EMS was called for moving assistance.  Patient did not request ED transport.  Patient's initial BP was 220/114 - repeat 160/104.  Patient is homeless and is out of BP meds.  Patient has hx of BLE edema and has difficulty walking.

## 2015-04-24 ENCOUNTER — Encounter: Payer: Self-pay | Admitting: Family Medicine

## 2015-04-24 ENCOUNTER — Ambulatory Visit: Payer: Medicaid Other | Attending: Family Medicine | Admitting: Family Medicine

## 2015-04-24 ENCOUNTER — Encounter (HOSPITAL_BASED_OUTPATIENT_CLINIC_OR_DEPARTMENT_OTHER): Payer: No Typology Code available for payment source | Admitting: Clinical

## 2015-04-24 VITALS — BP 172/104 | HR 75 | Temp 97.6°F | Resp 15 | Ht 69.0 in | Wt 241.8 lb

## 2015-04-24 DIAGNOSIS — Z0000 Encounter for general adult medical examination without abnormal findings: Secondary | ICD-10-CM | POA: Diagnosis not present

## 2015-04-24 DIAGNOSIS — M7500 Adhesive capsulitis of unspecified shoulder: Secondary | ICD-10-CM | POA: Insufficient documentation

## 2015-04-24 DIAGNOSIS — F17210 Nicotine dependence, cigarettes, uncomplicated: Secondary | ICD-10-CM | POA: Insufficient documentation

## 2015-04-24 DIAGNOSIS — G8929 Other chronic pain: Secondary | ICD-10-CM | POA: Diagnosis not present

## 2015-04-24 DIAGNOSIS — Z9119 Patient's noncompliance with other medical treatment and regimen: Secondary | ICD-10-CM | POA: Diagnosis not present

## 2015-04-24 DIAGNOSIS — M25511 Pain in right shoulder: Secondary | ICD-10-CM | POA: Insufficient documentation

## 2015-04-24 DIAGNOSIS — M7501 Adhesive capsulitis of right shoulder: Secondary | ICD-10-CM

## 2015-04-24 DIAGNOSIS — Z5900 Homelessness unspecified: Secondary | ICD-10-CM

## 2015-04-24 DIAGNOSIS — I10 Essential (primary) hypertension: Secondary | ICD-10-CM | POA: Insufficient documentation

## 2015-04-24 DIAGNOSIS — I890 Lymphedema, not elsewhere classified: Secondary | ICD-10-CM | POA: Insufficient documentation

## 2015-04-24 DIAGNOSIS — Z659 Problem related to unspecified psychosocial circumstances: Secondary | ICD-10-CM

## 2015-04-24 MED ORDER — ASPIRIN 325 MG PO TABS: 325.0000 mg | ORAL_TABLET | Freq: Every day | ORAL | Status: DC

## 2015-04-24 MED ORDER — CARVEDILOL 6.25 MG PO TABS: 6.2500 mg | ORAL_TABLET | Freq: Two times a day (BID) | ORAL | Status: DC

## 2015-04-24 MED ORDER — FUROSEMIDE 40 MG PO TABS: 40.0000 mg | ORAL_TABLET | Freq: Two times a day (BID) | ORAL | Status: DC

## 2015-04-24 NOTE — Progress Notes (Signed)
ASSESSMENT: Pt currently experiencing problem related to psychosocial circumstances, needs to f/u with [facility] physician; would benefit from supportive counseling and community resources regarding coping with symptoms of psychosocial circumstances. Stage of Change: precontemplative  PLAN: 1. F/U with behavioral health consultant in as needed, consider to address symptoms of depression and psychosocial circumstances 2. Psychiatric Medications: none. 3. Behavioral recommendation(s):   -Consider contacting medical supply stores to find one that carries correct size compression stockings -F/u with [facility] physician for DME referrals, as needed -Consider referral to nursing facility  SUBJECTIVE: Pt. referred by Dr Venetia NightAmao for community resources, nursing facility:  Pt. reports the following symptoms/concerns: Pt states his primary concerns are obtaining fully-electric wheelchair and compression stockings that fit; also agreeable to finding bed at nursing facility.  Duration of problem: less than a week, after last ED visit Severity: severe  OBJECTIVE: Orientation & Cognition: Oriented x3. Thought processes normal and appropriate to situation. Mood: appropriate. Affect: appropriate Appearance: appropriate Risk of harm to self or others: no known risk of harm to self or others Substance use: tobacco Assessments administered: PHQ9: 18  Diagnosis: Problem related to psychosocial circumstances CPT Code: Z65.9 -------------------------------------------- Other(s) present in the room: (At pt request),  [facility] Associate Director Caroleen Hammanerrence Pleasants, MSW, LCSWA   Time spent with patient in exam room: 16 minutes

## 2015-04-24 NOTE — Progress Notes (Signed)
Patient here to establish care [facility] Patient is currently homeless and has not been able to get his medications He recently got Medicaid He will take a flu shot

## 2015-04-24 NOTE — Patient Instructions (Signed)
Hypertension Hypertension, commonly called high blood pressure, is when the force of blood pumping through your arteries is too strong. Your arteries are the blood vessels that carry blood from your heart throughout your body. A blood pressure reading consists of a higher number over a lower number, such as 110/72. The higher number (systolic) is the pressure inside your arteries when your heart pumps. The lower number (diastolic) is the pressure inside your arteries when your heart relaxes. Ideally you want your blood pressure below 120/80. Hypertension forces your heart to work harder to pump blood. Your arteries may become narrow or stiff. Having untreated or uncontrolled hypertension can cause heart attack, stroke, kidney disease, and other problems. RISK FACTORS Some risk factors for high blood pressure are controllable. Others are not.  Risk factors you cannot control include:   Race. You may be at higher risk if you are African American.  Age. Risk increases with age.  Gender. Men are at higher risk than women before age 45 years. After age 65, women are at higher risk than men. Risk factors you can control include:  Not getting enough exercise or physical activity.  Being overweight.  Getting too much fat, sugar, calories, or salt in your diet.  Drinking too much alcohol. SIGNS AND SYMPTOMS Hypertension does not usually cause signs or symptoms. Extremely high blood pressure (hypertensive crisis) may cause headache, anxiety, shortness of breath, and nosebleed. DIAGNOSIS To check if you have hypertension, your health care provider will measure your blood pressure while you are seated, with your arm held at the level of your heart. It should be measured at least twice using the same arm. Certain conditions can cause a difference in blood pressure between your right and left arms. A blood pressure reading that is higher than normal on one occasion does not mean that you need treatment. If  it is not clear whether you have high blood pressure, you may be asked to return on a different day to have your blood pressure checked again. Or, you may be asked to monitor your blood pressure at home for 1 or more weeks. TREATMENT Treating high blood pressure includes making lifestyle changes and possibly taking medicine. Living a healthy lifestyle can help lower high blood pressure. You may need to change some of your habits. Lifestyle changes may include:  Following the DASH diet. This diet is high in fruits, vegetables, and whole grains. It is low in salt, red meat, and added sugars.  Keep your sodium intake below 2,300 mg per day.  Getting at least 30-45 minutes of aerobic exercise at least 4 times per week.  Losing weight if necessary.  Not smoking.  Limiting alcoholic beverages.  Learning ways to reduce stress. Your health care provider may prescribe medicine if lifestyle changes are not enough to get your blood pressure under control, and if one of the following is true:  You are 18-59 years of age and your systolic blood pressure is above 140.  You are 60 years of age or older, and your systolic blood pressure is above 150.  Your diastolic blood pressure is above 90.  You have diabetes, and your systolic blood pressure is over 140 or your diastolic blood pressure is over 90.  You have kidney disease and your blood pressure is above 140/90.  You have heart disease and your blood pressure is above 140/90. Your personal target blood pressure may vary depending on your medical conditions, your age, and other factors. HOME CARE INSTRUCTIONS    Have your blood pressure rechecked as directed by your health care provider.   Take medicines only as directed by your health care provider. Follow the directions carefully. Blood pressure medicines must be taken as prescribed. The medicine does not work as well when you skip doses. Skipping doses also puts you at risk for  problems.  Do not smoke.   Monitor your blood pressure at home as directed by your health care provider. SEEK MEDICAL CARE IF:   You think you are having a reaction to medicines taken.  You have recurrent headaches or feel dizzy.  You have swelling in your ankles.  You have trouble with your vision. SEEK IMMEDIATE MEDICAL CARE IF:  You develop a severe headache or confusion.  You have unusual weakness, numbness, or feel faint.  You have severe chest or abdominal pain.  You vomit repeatedly.  You have trouble breathing. MAKE SURE YOU:   Understand these instructions.  Will watch your condition.  Will get help right away if you are not doing well or get worse.   This information is not intended to replace advice given to you by your health care provider. Make sure you discuss any questions you have with your health care provider.   Document Released: 04/29/2005 Document Revised: 09/13/2014 Document Reviewed: 02/19/2013 Elsevier Interactive Patient Education 2016 Elsevier Inc.  
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
## 2015-04-25 ENCOUNTER — Encounter: Payer: Self-pay | Admitting: Family Medicine

## 2015-04-25 DIAGNOSIS — Z0000 Encounter for general adult medical examination without abnormal findings: Secondary | ICD-10-CM

## 2015-04-28 ENCOUNTER — Other Ambulatory Visit: Payer: Self-pay | Admitting: Family Medicine

## 2015-04-28 DIAGNOSIS — Z5900 Homelessness unspecified: Secondary | ICD-10-CM

## 2015-04-28 DIAGNOSIS — I890 Lymphedema, not elsewhere classified: Secondary | ICD-10-CM

## 2015-04-28 DIAGNOSIS — M7501 Adhesive capsulitis of right shoulder: Secondary | ICD-10-CM

## 2015-05-03 ENCOUNTER — Other Ambulatory Visit: Payer: No Typology Code available for payment source

## 2015-05-03 ENCOUNTER — Ambulatory Visit: Payer: Medicaid Other | Attending: Family Medicine | Admitting: Clinical

## 2015-05-03 ENCOUNTER — Other Ambulatory Visit: Payer: No Typology Code available for payment source | Admitting: Pharmacist

## 2015-05-03 DIAGNOSIS — Z659 Problem related to unspecified psychosocial circumstances: Secondary | ICD-10-CM

## 2015-05-03 NOTE — Progress Notes (Signed)
ASSESSMENT: Pt currently experiencing problem related to psychosocial circumstances. Pt needs to f/u with PCP and may benefit from continued supportive counseling regarding coping with psychosocial circumstances.  Stage of Change: precontemplative  PLAN: 1. F/U with behavioral health consultant in as needed 2. Psychiatric Medications: none. 3. Behavioral recommendation(s):   -Return to [facility] for tb skin test -Consider assisted living facility that allows smoking on premises  SUBJECTIVE: Pt. referred by Dr Venetia NightAmao for psychosocial :  Pt. reports the following symptoms/concerns: Pt states that he is thinking about getting into an assisted living facility, but that when his disability check comes in, he does not want to give a facility his disability check; he wants to keep his disability check. He also does not want to go to a smoke-free facility, as he does not want to give up tobacco use. Duration of problem: at least one day Severity: mild  OBJECTIVE: Orientation & Cognition: Oriented x3. Thought processes normal and appropriate to situation. Mood: appropriate. Affect: appropriate Appearance: appropriate Risk of harm to self or others: no known risk of harm to self or others Substance use: tobacco Assessments administered: refused  Diagnosis: Problem related to psychosocial circumstances CPT Code: Z65.9 -------------------------------------------- Other(s) present in the room: none  Time spent with patient in exam room: 15 minutes

## 2015-05-05 ENCOUNTER — Other Ambulatory Visit: Payer: No Typology Code available for payment source

## 2015-05-06 ENCOUNTER — Emergency Department (HOSPITAL_COMMUNITY)
Admission: EM | Admit: 2015-05-06 | Discharge: 2015-05-06 | Disposition: A | Discharge status: Home / Self Care | Payer: Medicaid Other | Attending: Emergency Medicine | Admitting: Emergency Medicine

## 2015-05-06 ENCOUNTER — Encounter (HOSPITAL_COMMUNITY): Payer: Self-pay | Admitting: *Deleted

## 2015-05-06 DIAGNOSIS — Z87828 Personal history of other (healed) physical injury and trauma: Secondary | ICD-10-CM | POA: Diagnosis not present

## 2015-05-06 DIAGNOSIS — Z79899 Other long term (current) drug therapy: Secondary | ICD-10-CM | POA: Insufficient documentation

## 2015-05-06 DIAGNOSIS — Z9889 Other specified postprocedural states: Secondary | ICD-10-CM | POA: Diagnosis not present

## 2015-05-06 DIAGNOSIS — I10 Essential (primary) hypertension: Secondary | ICD-10-CM | POA: Diagnosis not present

## 2015-05-06 DIAGNOSIS — Z8669 Personal history of other diseases of the nervous system and sense organs: Secondary | ICD-10-CM | POA: Insufficient documentation

## 2015-05-06 DIAGNOSIS — R2243 Localized swelling, mass and lump, lower limb, bilateral: Secondary | ICD-10-CM | POA: Diagnosis present

## 2015-05-06 DIAGNOSIS — Z5900 Homelessness unspecified: Secondary | ICD-10-CM

## 2015-05-06 DIAGNOSIS — F17210 Nicotine dependence, cigarettes, uncomplicated: Secondary | ICD-10-CM | POA: Diagnosis not present

## 2015-05-06 DIAGNOSIS — Z7982 Long term (current) use of aspirin: Secondary | ICD-10-CM | POA: Insufficient documentation

## 2015-05-06 DIAGNOSIS — R600 Localized edema: Secondary | ICD-10-CM

## 2015-05-06 NOTE — ED Notes (Signed)
Pt declined to have last set of vitals taken.
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
## 2015-05-06 NOTE — Discharge Instructions (Signed)
Peripheral Edema You have swelling in your legs (peripheral edema). This swelling is due to excess accumulation of salt and water in your body. Edema may be a sign of heart, kidney or liver disease, or a side effect of a medication. It may also be due to problems in the leg veins. Elevating your legs and using special support stockings may be very helpful, if the cause of the swelling is due to poor venous circulation. Avoid long periods of standing, whatever the cause. Treatment of edema depends on identifying the cause. Chips, pretzels, pickles and other salty foods should be avoided. Restricting salt in your diet is almost always needed. Water pills (diuretics) are often used to remove the excess salt and water from your body via urine. These medicines prevent the kidney from reabsorbing sodium. This increases urine flow. Diuretic treatment may also result in lowering of potassium levels in your body. Potassium supplements may be needed if you have to use diuretics daily. Daily weights can help you keep track of your progress in clearing your edema. You should call your caregiver for follow up care as recommended. SEEK IMMEDIATE MEDICAL CARE IF:   You have increased swelling, pain, redness, or heat in your legs.  You develop shortness of breath, especially when lying down.  You develop chest or abdominal pain, weakness, or fainting.  You have a fever.   This information is not intended to replace advice given to you by your health care provider. Make sure you discuss any questions you have with your health care provider.   Document Released: 06/06/2004 Document Revised: 07/22/2011 Document Reviewed: 11/09/2014 Elsevier Interactive Patient Education 2016 [employer].  Emergency Department Resource Guide 1) Find a Doctor and Pay Out of Pocket Although you won't have to find out who is covered by your insurance plan, it is a good idea to ask around and get recommendations. You will then need  to call the office and see if the doctor you have chosen will accept you as a new patient and what types of options they offer for patients who are self-pay. Some doctors offer discounts or will set up payment plans for their patients who do not have insurance, but you will need to ask so you aren't surprised when you get to your appointment.  2) Contact Your Local Health Department Not all health departments have doctors that can see patients for sick visits, but many do, so it is worth a call to see if yours does. If you don't know where your local health department is, you can check in your phone book. The CDC also has a tool to help you locate your state's health department, and many state websites also have listings of all of their local health departments.  3) Find a Walk-in Clinic If your illness is not likely to be very severe or complicated, you may want to try a walk in clinic. These are popping up all over the country in pharmacies, drugstores, and shopping centers. They're usually staffed by nurse practitioners or physician assistants that have been trained to treat common illnesses and complaints. They're usually fairly quick and inexpensive. However, if you have serious medical issues or chronic medical problems, these are probably not your best option.  No Primary Care Doctor: - Call Health Connect at  [phone] - they can help you locate a primary care doctor that  accepts your insurance, provides certain services, etc. - Physician Referral Service- [phone]  Chronic Pain Problems: Organization  Address  Phone   Notes  [facility]  [phone] Patients need to be referred by their primary care doctor.   Medication Assistance: Organization         Address  Phone   Notes  [facility] Medication [facility] [address] [place], [place] [address] [phone] --Must be a resident of [facility] -- Must have NO insurance  coverage whatsoever (no Medicaid/ Medicare, etc.) -- The pt. MUST have a primary care doctor that directs their care regularly and follows them in the community   [facility]  [phone]   [employer]  [phone]    Agencies that provide inexpensive medical care: Organization         Address  Phone   Notes  [place]  [phone]   [facility]    [phone]   [facility] [place], [place] [address] [phone]   [place] [address], [place] [phone]   Planned Parenthood    [phone]   [facility]    [phone]   [place] and [address], [place] Phone:  [phone], Fax:  [phone] Hours of Operation:  9 am - 6 pm, M-F.  Also accepts Medicaid/Medicare and self-pay.  [facility] for [place], [address], [place] Phone: [phone], Fax: [phone]. Hours of Operation:  8:30 am - 5:30 pm, M-F.  Also accepts Medicaid and self-pay.  [facility] [address] [place] Phone: [phone]   [place], [place], [place] [phone], Ext. [phone] Mondays & Thursdays: 7-9 AM.  First 15 patients are seen on a first come, first serve basis.    [place] Providers:  Organization         Address  Phone   Notes  [facility] [address], [place] [phone] Also accepts self-pay patients.  [facility] [address], [place]  [phone]   [place], [address], [place] [phone]   [facility] [address] [place] [phone]   Lucianne Lei [address], [place]   [phone] Only accepts [place] patients after they have their  name applied to their card.   Self-Pay (no insurance) in [facility]:  Organization         Address  Phone   Notes  Sickle Cell Patients, [facility] [place] [phone]   [facility] [place] [phone]   [facility] [place]  [place], [place], [place] [phone]   [facility]/Dr. Osei-Bonsu  [address] [place] or [address], [place] [phone] Phone number for both [place] and [place] locations is the same.  [facility] [address] [place] [phone]   [facility] [address] [place] or [address] [phone]   [facility] [address], [place] [phone], phone; [phone], fax Sees patients 1st and 3rd Saturday of every month.  Must not qualify  for public or private insurance (i.e. Medicaid, Medicare, [place] Health Choice, Veterans' Benefits)  Household income should be no more than 200% of the poverty level The clinic cannot treat you if you are pregnant or think you are pregnant  Sexually transmitted diseases are not treated at the clinic.    Dental Care: Organization         Address  Phone  Notes  [facility] Department of [facility] [address] [place], [place] [phone] Accepts children up to age 51 who are enrolled in [place] or [place] Health Choice; pregnant women with a Medicaid card; and children who have applied for Medicaid or [place] Health Choice, but were declined, whose parents can pay a reduced fee at time of service.  [facility] Department of [facility]  [address], [place] [phone] Accepts children up to age 27 who are enrolled in [place] or [place] Health Choice; pregnant women with a Medicaid card; and children who have applied for  Medicaid or [place] Health Choice, but were declined, whose parents can pay a reduced fee at time of service.  [facility]  [address] [place], [place] [phone] Patients are seen by appointment only. Walk-ins are not accepted. [facility] will see patients 49 years of age and older. Monday - Tuesday (8am-5pm) Most Wednesdays (8:30-5pm) $30 per visit, cash only  [facility] Adult [facility] Access [facility]  [address], [facility] [phone] Patients are seen by appointment only. Walk-ins are not accepted. [facility] will see patients 40 years of age and older. One Wednesday Evening (Monthly: Volunteer Based).  $30 per visit, cash only  [employer]  [phone] for adults; Children under age 66, call Graduate Pediatric Dentistry at [phone]. Children aged 40-14, please call [phone] to request a pediatric application.  Dental services are provided in all areas of dental care including fillings, crowns and bridges, complete and partial dentures, implants, gum treatment, root canals, and extractions. Preventive care is also provided. Treatment is provided to both adults and children. Patients are selected via a lottery and there is often a waiting list.   [facility] [address], [employer]  [phone] [email or web address]   [facility] [address] [place], [place] [phone], Ext. [phone] Second and Fourth Thursday of each month, opens at 6:30 AM; Clinic ends at 9 AM.  Patients are seen on a first-come first-served basis, and a limited number are seen during each clinic.   [facility]  [address] [place], [place] [phone]   Eligibility Requirements You must have lived in [place], [place], or [place] counties for at least the last three months.   You cannot be eligible for state or federal sponsored [place], including [employer], [place],  or [employer].   You generally cannot be eligible for healthcare insurance through your employer.    How to apply: Eligibility screenings are held every Tuesday and Wednesday afternoon from 1:00 pm until 4:00 pm. You do not need an appointment for the interview!  [facility] [address], [place], [place] [phone]   [facility] Health Department  [phone]   [facility] Health Department  [phone]   [facility] Health Department  [phone]    Behavioral Health Resources in the Community: Intensive Outpatient Programs Organization         Address  Phone  Notes  [facility] [address], [place], [place] [phone]   [facility] [address] [place], [place] [phone]   [facility] [address], [place], [place]  [phone]   [facility] [address]  [place], [place] [phone] or [phone]   Substance Abuse Resources Organization         Address  Phone  Notes  [facility]  [phone]   [facility]  [phone]   [facility] [place]  ([phone]   Floydene Flock  [phone]   Residential & Outpatient Substance Abuse Program  [phone]   Psychological Services Organization         Address  Phone  Notes  [facility] Behavioral Health  [phone]   [facility]  [phone]   [facility] Mental Health [address] [place] [phone] or [phone]    Mobile Crisis Teams Organization         Address  Phone  Notes  [facility], Mobile Crisis Care Unit  [phone]   [facility]  [address] [place], [place] [phone]   Doristine Locks [address] [place] [phone]    Self-Help/Support Groups Organization         Address  Phone             Notes  [facility] [place] - variety of support groups   336- I7437963 Call for more information  Narcotics Anonymous (NA), [facility] [address], [employer] [place]  2 meetings at this location   Statistician         Address  Phone  Notes  [facility] [address],    [place]  [phone]   [facility]  [address], [place] [address], [place], [place] [phone]   [facility] Treatment [facility] [address] [place], [place] [phone] Admissions: 8am-3pm M-F  [facility] [address],    [place], [place] [phone]   [facility] [address] [place], [place], [place] [phone]   [facility] [address]  [place], [place] [phone]   [facility] - Intensive Outpatient [address], [place], [place] [phone]   [facility] (Addiction [facility] [address] [place].,  [place], [place] [phone] or [phone]   [facility] ([facility]) [address], [place], [place] [phone] Accepts Medicaid  [facility] [address]  [place] [phone] Substance Abuse/Addiction Treatment   [facility] Organization         Address  Phone  Notes  [facility]  ([phone]   Angie Fava, PhD [address] [place], [place]   [phone] or [phone]   [facility] Behavioral   [address] [place], [place] [phone]   [facility] [address] [place], [place] [phone] Insurance/Medicaid/sponsorship through [facility] and Families [address]                                    [place], [place] [phone] Therapy/tele-psych/case  [facility] [address], [place] [phone]    Dr. Lolly Mustache  [phone]   [facility]  [facility] 1) [address], [place] 2) [address], [place] 3)  [address], [place] [phone](336) [phone](336) [phone]  [phone](336) [phone]   [facility] [place] Child Abuse Hotline 8641886223(336) (502)133-8142 or [phone](336) [phone] (After Hours)
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
## 2015-05-09 ENCOUNTER — Other Ambulatory Visit (HOSPITAL_BASED_OUTPATIENT_CLINIC_OR_DEPARTMENT_OTHER): Payer: No Typology Code available for payment source

## 2015-05-09 ENCOUNTER — Emergency Department (HOSPITAL_COMMUNITY)
Admission: EM | Admit: 2015-05-09 | Discharge: 2015-05-10 | Disposition: A | Discharge status: Home / Self Care | Payer: Medicaid Other | Attending: Emergency Medicine | Admitting: Emergency Medicine

## 2015-05-09 ENCOUNTER — Encounter (HOSPITAL_COMMUNITY): Payer: Self-pay | Admitting: Emergency Medicine

## 2015-05-09 DIAGNOSIS — F17210 Nicotine dependence, cigarettes, uncomplicated: Secondary | ICD-10-CM | POA: Insufficient documentation

## 2015-05-09 DIAGNOSIS — Z7982 Long term (current) use of aspirin: Secondary | ICD-10-CM | POA: Insufficient documentation

## 2015-05-09 DIAGNOSIS — Z8669 Personal history of other diseases of the nervous system and sense organs: Secondary | ICD-10-CM | POA: Insufficient documentation

## 2015-05-09 DIAGNOSIS — Z590 Homelessness: Secondary | ICD-10-CM | POA: Diagnosis not present

## 2015-05-09 DIAGNOSIS — Z0000 Encounter for general adult medical examination without abnormal findings: Secondary | ICD-10-CM

## 2015-05-09 DIAGNOSIS — Z87828 Personal history of other (healed) physical injury and trauma: Secondary | ICD-10-CM | POA: Insufficient documentation

## 2015-05-09 DIAGNOSIS — R2243 Localized swelling, mass and lump, lower limb, bilateral: Secondary | ICD-10-CM | POA: Insufficient documentation

## 2015-05-09 DIAGNOSIS — I10 Essential (primary) hypertension: Secondary | ICD-10-CM | POA: Diagnosis not present

## 2015-05-09 DIAGNOSIS — Z79899 Other long term (current) drug therapy: Secondary | ICD-10-CM | POA: Diagnosis not present

## 2015-05-09 NOTE — Progress Notes (Unsigned)
[facility] Patient is aware he is to come back in two days to have it read Patient put on nurse schedule for 11am 12/29

## 2015-05-09 NOTE — ED Notes (Signed)
Per EMS: Pt is homeless and was [facility] way to [facility] to [facility].  Pt was struggling to get there so he called 911.  Pt has significant lower leg swelling x several years.  No pain.

## 2015-05-10 ENCOUNTER — Other Ambulatory Visit: Payer: No Typology Code available for payment source | Admitting: Pharmacist

## 2015-05-10 LAB — COMPREHENSIVE METABOLIC PANEL
ALBUMIN: 3.6 g/dL (ref 3.5–5.0)
ALT: 20 U/L (ref 17–63)
AST: 19 U/L (ref 15–41)
Alkaline Phosphatase: 85 U/L (ref 38–126)
Anion gap: 9 (ref 5–15)
BUN: 14 mg/dL (ref 6–20)
CHLORIDE: 106 mmol/L (ref 101–111)
CO2: 29 mmol/L (ref 22–32)
Calcium: 8.7 mg/dL — ABNORMAL LOW (ref 8.9–10.3)
Creatinine, Ser: 0.68 mg/dL (ref 0.61–1.24)
GFR calc Af Amer: >60 mL/min (ref >60)
GFR calc non Af Amer: >60 mL/min (ref >60)
GLUCOSE: 91 mg/dL (ref 65–99)
POTASSIUM: 4.1 mmol/L (ref 3.5–5.1)
SODIUM: 144 mmol/L (ref 135–145)
Total Bilirubin: 0.5 mg/dL (ref 0.3–1.2)
Total Protein: 7.4 g/dL (ref 6.5–8.1)

## 2015-05-10 LAB — BRAIN NATRIURETIC PEPTIDE: B NATRIURETIC PEPTIDE 5: 8.2 pg/mL (ref 0.0–100.0)

## 2015-05-10 LAB — CBC WITH DIFFERENTIAL/PLATELET
BASOS ABS: 0.1 10*3/uL (ref 0.0–0.1)
BASOS PCT: 1 %
EOS PCT: 1 %
Eosinophils Absolute: 0.1 10*3/uL (ref 0.0–0.7)
HCT: 40.1 % (ref 39.0–52.0)
Hemoglobin: 13 g/dL (ref 13.0–17.0)
Lymphocytes Relative: 38 %
Lymphs Abs: 2 10*3/uL (ref 0.7–4.0)
MCH: 28.7 pg (ref 26.0–34.0)
MCHC: 32.4 g/dL (ref 30.0–36.0)
MCV: 88.5 fL (ref 78.0–100.0)
MONO ABS: 0.7 10*3/uL (ref 0.1–1.0)
Monocytes Relative: 13 %
Neutro Abs: 2.5 10*3/uL (ref 1.7–7.7)
Neutrophils Relative %: 48 %
PLATELETS: 279 10*3/uL (ref 150–400)
RBC: 4.53 MIL/uL (ref 4.22–5.81)
RDW: 14.9 % (ref 11.5–15.5)
WBC: 5.3 10*3/uL (ref 4.0–10.5)

## 2015-05-10 NOTE — Discharge Instructions (Signed)
°Emergency Department Resource Guide °1) Find a Doctor and Pay Out of Pocket °Although you won't have to find out who is covered by your insurance plan, it is a good idea to ask around and get recommendations. You will then need to call the office and see if the doctor you have chosen will accept you as a new patient and what types of options they offer for patients who are self-pay. Some doctors offer discounts or will set up payment plans for their patients who do not have insurance, but you will need to ask so you aren't surprised when you get to your appointment. ° °2) Contact Your Local Health Department °Not all health departments have doctors that can see patients for sick visits, but many do, so it is worth a call to see if yours does. If you don't know where your local health department is, you can check in your phone book. The CDC also has a tool to help you locate your state's health department, and many state websites also have listings of all of their local health departments. ° °3) Find a Walk-in Clinic °If your illness is not likely to be very severe or complicated, you may want to try a walk in clinic. These are popping up all over the country in pharmacies, drugstores, and shopping centers. They're usually staffed by nurse practitioners or physician assistants that have been trained to treat common illnesses and complaints. They're usually fairly quick and inexpensive. However, if you have serious medical issues or chronic medical problems, these are probably not your best option. ° °No Primary Care Doctor: °- Call [facility] at  832-8000 - they can help you locate a primary care doctor that  accepts your insurance, provides certain services, etc. °- Physician Referral Service- 1-800-533-3463 ° °Chronic Pain Problems: °Organization         Address  Phone   Notes  °[facility]  (336) 297-2271 Patients need to be referred by their primary care doctor.  ° °Medication  Assistance: °Organization         Address  Phone   Notes  °[place] [facility] [address] °[place], [place] [address] (336) 641-8030 --Must be a resident of [place] °-- Must have NO insurance coverage whatsoever (no Medicaid/ Medicare, etc.) °-- The pt. MUST have a primary care doctor that directs their care regularly and follows them in the community °  °[facility]  (866) 331-1348   °[employer]  (888) 892-1162   ° °Agencies that provide inexpensive medical care: °Organization         Address  Phone   Notes  °[facility]  (336) 832-8035   ° Internal Medicine    (336) 832-7272   °[facility] [address] °[place], [place] [address] (336) 832-4777   °[facility] [address], °[place] (336) 271-4999   °[facility]    (336) 373-0678   °[facility]    (336) 272-1050   °[facility] ° [address], [place] Phone:  (336) 832-4444, Fax:  (336) 832-4440 Hours of Operation:  9 am - 6 pm, M-F.  Also accepts Medicaid/Medicare and self-pay.  °[facility] ° [address],  Phone: (336) 832-3150, Fax: (336) 832-3151. Hours of Operation:  8:30 am - 5:30 pm, M-F.  Also accepts Medicaid and self-pay.  °[facility] [place] [address], [place] Phone: (336) 878-6027   °[facility] [address], [place], [place] (336)723-1848, Ext. 123 Mondays & Thursdays: 7-9 AM.  First 15 patients are seen on a first come, first serve basis. °  ° °Medicaid-accepting [place] Providers: ° °Organization         Address  Phone   Notes  °[facility] [address], [place] (336) 641-2100 Also accepts self-pay patients.  °[facility] [address], [place] ° (336) 856-9996   °[facility] [address], [place]  (336) 288-8857   °[facility] [address] [place] [address], [place] (336) 299-7000   °Veita Bland [address], [place]  ° (336) 373-1557 Only accepts [place] [employer] Medicaid patients after they have their name applied to their card.  ° °Self-Pay (no insurance) in [place]: ° °Organization         Address  Phone   Notes  °Sickle Cell Patients, [facility] [address], [place] (336) 832-1970   °[facility] [address], [place] (336) 832-4400   °[facility] ° [address], [place] (336) 992-4800   °[facility]/Dr. Osei-Bonsu ° [address] [place] [address], [place] or [address], [place] (336) 841-8500 Phone number for both [place] and [place] locations is the same.  °[facility] [address], [place] (336) 299-0000   °[facility] [address] [place] [address], [place] or [address] (336) 852-7530 °(336) 878-2260   °[facility] [address], [place] (336) 350-1642, phone; (336) 294-5005, fax Sees patients 1st and 3rd Saturday of every month.  Must not qualify for public or private insurance (i.e. Medicaid, Medicare, [place] Health Choice, Veterans' Benefits) • Household income should be no more than 200% of the poverty level •The clinic cannot treat you if you are pregnant or think you are pregnant • Sexually transmitted diseases are not treated at the clinic.  ° ° °Dental Care: °Organization         Address  Phone  Notes  °[place] [facility] [address], [place] (336) 641-6152 Accepts children up to age 21 who are enrolled in Medicaid or [place] Health Choice; pregnant women with a Medicaid card; and children who have applied for Medicaid or [place] Health Choice, but were declined, whose parents can pay a reduced fee at time of service.  °[place]  [facility] [place]  [address], [place] (336) 641-7733 Accepts children up to age 21 who are enrolled in Medicaid or [place] Health Choice; pregnant women with a Medicaid card; and children who have applied for Medicaid or  Health Choice, but were declined, whose parents can pay a reduced fee at time of service.  °[facility] ° [address],  (336) 641-4533 Patients are seen by appointment only. Walk-ins are not accepted. [facility] will see patients 18 years of age and older. °Monday - Tuesday (8am-5pm) °Most Wednesdays (8:30-5pm) °$30 per visit, cash only  °[facility] ° [address], [place] (336) 641-4533 Patients are seen by appointment only. Walk-ins are not accepted. [facility] will see patients 18 years of age and older. °One   Wednesday Evening (Monthly: Volunteer Based).  $30 per visit, cash only  °[facility]  (919) 537-3737 for adults; Children under age 4, call Graduate Pediatric Dentistry at (919) [phone]. Children aged 4-14, please call (919) 537-3737 to request a pediatric application. ° Dental services are provided in all areas of dental care including fillings, crowns and bridges, complete and partial dentures, implants, gum treatment, root canals, and extractions. Preventive care is also provided. Treatment is provided to both adults and children. °Patients are selected via a lottery and there is often a waiting list. °  °[facility] [address], °[place] ° (336) 763-8833 [email or web address] °  °[facility] [address], [place], [place] (336)723-1848, Ext. 123 Second and Fourth Thursday of each month, opens at 6:30 AM; Clinic ends at 9 AM.  Patients are seen on a first-come first-served basis, and a limited number are seen during each clinic.  ° °[facility] ° [address], [place], [place] (336) 723-7904    Eligibility Requirements °You must have lived in [place], [place], or [place] counties for at least the last three months. °  You cannot be eligible for state or federal sponsored healthcare insurance, including Veterans Administration, Medicaid, or Medicare. °  You generally cannot be eligible for healthcare insurance through your employer.  °  How to apply: °Eligibility screenings are held every Tuesday and Wednesday afternoon from 1:00 pm until 4:00 pm. You do not need an appointment for the interview!  °[facility] [address], [place], [place] 336-631-2330   °[place] [facility]  336-342-8273   °[place] [facility]  336-703-3100   °[facility]  336-570-6415   ° °Behavioral Health Resources in the Community: °Intensive Outpatient Programs °Organization         Address  Phone  Notes  °[facility] [address], [place], [place] 336-878-6098   °[facility] [address], [place], [place] 336-832-9800   °[facility] [address], [place], [place] ° 336-882-2125   °[facility] [address],  °[place], [place] 1-800-853-5163 or 336-641-4981   °Substance Abuse Resources °Organization         Address  Phone  Notes  °[facility]  336-882-2125   °[facility]  336-784-9470   °[facility]  336-285-9073   °[facility]  336-845-3988   °Residential & Outpatient Substance Abuse Program  1-800-659-3381   °Psychological Services °Organization         Address  Phone  Notes  °[facility]  336- 832-9600   °[facility]  336- 378-7881   °[facility] [address], [place] 1-800-853-5163 or 336-641-4981   ° °Mobile Crisis Teams °Organization         Address  Phone  Notes  °[facility], Mobile Crisis Care Unit  1-877-626-1772   °Assertive °[facility] ° [address]  [place], [place] 336-834-9664   °Sharon DeEsch [address] °[place] 336-554-5454   ° °Self-Help/Support Groups °Organization         Address  Phone             Notes  °[facility]  - variety of support groups  336- 373-1402 Call for more information  °Narcotics Anonymous (NA), [facility] [address], °[place]  2 meetings at this location  ° °  Residential Treatment Programs °Organization         Address  Phone  Notes  °[facility] [address],    °[place]  1-866-801-8205   °[facility] ° [address], [place], [place] 704-293-8524   °[facility] [address], [place] 336-845-3988 Admissions: 8am-3pm M-F  °[facility] [address]    °[place], [place] 336-841-1104   °[facility] [address], [place], [place] 336-379-7146   °[facility] [address]  °[place], [place] 336-285-9073   °[facility] - Intensive Outpatient [address], [place], [place] 336-852-3033   °[facility] [address]  °[place], [place] 1-877-615-2722 or 336-784-9470   °[facility] [address] [place], [place] 336-227-7417 Accepts Medicaid  °[facility] [address]  °[place] 1-800-659-3381 Substance Abuse/Addiction Treatment  ° °[place] Behavioral Health Resources °Organization         Address  Phone  Notes  °[facility]  (888) 581-9988   °Julie Brannon, PhD [address] [place], [place]   (336) 349-5553 or (336) 951-0000   °[facility]   [address] °[place], [place] (336) 349-4454   °[facility] [address], [place], [place] (336) 342-8316 Insurance/Medicaid/sponsorship through [facility]  °[facility] [address]                                    [place], [place] (336) 342-8316 Therapy/tele-psych/case    °[facility] [address]  ° [place], [place] (336) 349-2233    °Dr. Arfeen  (336) 349-4544   °[facility] [place]  [employer] [place] [facility] 1) [address],  °2) [address], [place] °3)  [address], [place] (336) 349-3220 °(336) 342-7768 ° °(336) 342-8140   °[place] Child Abuse [facility] (336) 342-1394 or (336) 342-3537 (After Hours)    ° ° °
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
## 2015-05-10 NOTE — ED Notes (Signed)
Pt noted to be sleeping in the lobby.  This Charge RN woke the Pt and asked how he was going to get home.  Pt reported "someone was supposed to be checking on something, but I don't really know."  Pt offered a bus ticket and sts "that will work."

## 2015-05-11 ENCOUNTER — Other Ambulatory Visit: Payer: No Typology Code available for payment source

## 2015-05-11 ENCOUNTER — Ambulatory Visit: Payer: Medicaid Other | Attending: Family Medicine

## 2015-05-11 LAB — TB SKIN TEST
Induration: 0 mm
TB SKIN TEST: NEGATIVE

## 2015-05-18 ENCOUNTER — Encounter: Payer: Self-pay | Admitting: Clinical
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
## 2015-06-01 ENCOUNTER — Encounter: Payer: Self-pay | Admitting: Clinical

## 2015-06-01 ENCOUNTER — Telehealth: Payer: Self-pay | Admitting: Clinical

## 2015-06-01 NOTE — Progress Notes (Signed)
After speaking to [name] at [facility], it is found that [name] does not qualify for placement at that facility at this time; he is at risk of losing his Medicaid if his disability case is denied a fourth time. Ms. [name] suggests that [name] make it to all of his disability hearings, because the case will likely be denied again if he misses one hearing; she says she has spoken at length with [name] concerning his options.

## 2015-06-01 NOTE — Telephone Encounter (Signed)
Attempt to f/u with [name], left HIPPA-compliant message to return call to [place] from [facility] at [phone].

## 2015-06-02 ENCOUNTER — Telehealth: Payer: Self-pay | Admitting: Clinical

## 2015-06-02 NOTE — Telephone Encounter (Signed)
Attempt to f/u with pt concerning housing, left HIPPA-compliant message to return call to [place] at [facility] at [phone].

## 2015-06-03 ENCOUNTER — Encounter (HOSPITAL_COMMUNITY): Payer: Self-pay | Admitting: Emergency Medicine

## 2015-06-03 ENCOUNTER — Emergency Department (HOSPITAL_COMMUNITY)
Admission: EM | Admit: 2015-06-03 | Discharge: 2015-06-03 | Disposition: A | Discharge status: Home / Self Care | Payer: Medicaid Other | Attending: Emergency Medicine | Admitting: Emergency Medicine

## 2015-06-03 DIAGNOSIS — F17210 Nicotine dependence, cigarettes, uncomplicated: Secondary | ICD-10-CM | POA: Insufficient documentation

## 2015-06-03 DIAGNOSIS — Z7982 Long term (current) use of aspirin: Secondary | ICD-10-CM | POA: Insufficient documentation

## 2015-06-03 DIAGNOSIS — I890 Lymphedema, not elsewhere classified: Secondary | ICD-10-CM

## 2015-06-03 DIAGNOSIS — Z87828 Personal history of other (healed) physical injury and trauma: Secondary | ICD-10-CM | POA: Diagnosis not present

## 2015-06-03 DIAGNOSIS — I10 Essential (primary) hypertension: Secondary | ICD-10-CM | POA: Diagnosis not present

## 2015-06-03 DIAGNOSIS — Z8669 Personal history of other diseases of the nervous system and sense organs: Secondary | ICD-10-CM | POA: Diagnosis not present

## 2015-06-03 DIAGNOSIS — Z79899 Other long term (current) drug therapy: Secondary | ICD-10-CM | POA: Insufficient documentation

## 2015-06-03 DIAGNOSIS — M7989 Other specified soft tissue disorders: Secondary | ICD-10-CM | POA: Diagnosis present

## 2015-06-03 LAB — COMPREHENSIVE METABOLIC PANEL
ALK PHOS: 92 U/L (ref 38–126)
ALT: 18 U/L (ref 17–63)
ANION GAP: 11 (ref 5–15)
AST: 21 U/L (ref 15–41)
Albumin: 3.7 g/dL (ref 3.5–5.0)
BUN: 7 mg/dL (ref 6–20)
CALCIUM: 9.3 mg/dL (ref 8.9–10.3)
CO2: 29 mmol/L (ref 22–32)
CREATININE: 0.71 mg/dL (ref 0.61–1.24)
Chloride: 99 mmol/L — ABNORMAL LOW (ref 101–111)
Glucose, Bld: 89 mg/dL (ref 65–99)
Potassium: 4.2 mmol/L (ref 3.5–5.1)
SODIUM: 139 mmol/L (ref 135–145)
Total Bilirubin: 0.5 mg/dL (ref 0.3–1.2)
Total Protein: 7.6 g/dL (ref 6.5–8.1)

## 2015-06-03 LAB — CBC
HCT: 42 % (ref 39.0–52.0)
Hemoglobin: 14.4 g/dL (ref 13.0–17.0)
MCH: 29.6 pg (ref 26.0–34.0)
MCHC: 34.3 g/dL (ref 30.0–36.0)
MCV: 86.4 fL (ref 78.0–100.0)
PLATELETS: 261 10*3/uL (ref 150–400)
RBC: 4.86 MIL/uL (ref 4.22–5.81)
RDW: 14.2 % (ref 11.5–15.5)
WBC: 6.9 10*3/uL (ref 4.0–10.5)

## 2015-06-03 NOTE — Progress Notes (Signed)
Orthopedic Tech Progress Note Patient Details:  [name] 06/02/1965 [id number]  Ortho Devices Type of Ortho Device: Roland Rack boot Ortho Device/Splint Location: (B) LE Ortho Device/Splint Interventions: Ordered, Application   Jennye Moccasin 06/03/2015, 3:41 PM

## 2015-06-03 NOTE — ED Notes (Signed)
[employer]otech paged and responded.  To come and assist with unaboot application.

## 2015-06-03 NOTE — ED Notes (Signed)
Per EMS: long term edema issues, this particular episode has been going on for a week.  Pain in lower extremities, hypertensive, has taken medication today.  174/110.  HR 60 regular.   16 rr.  Pain 8/10.  No sob.
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
## 2015-06-06 ENCOUNTER — Emergency Department (HOSPITAL_COMMUNITY): Payer: Medicaid Other

## 2015-06-06 ENCOUNTER — Encounter (HOSPITAL_COMMUNITY): Payer: Self-pay

## 2015-06-06 ENCOUNTER — Emergency Department (HOSPITAL_COMMUNITY)
Admission: EM | Admit: 2015-06-06 | Discharge: 2015-06-06 | Disposition: A | Discharge status: Home / Self Care | Payer: Medicaid Other | Attending: Emergency Medicine | Admitting: Emergency Medicine

## 2015-06-06 DIAGNOSIS — I10 Essential (primary) hypertension: Secondary | ICD-10-CM | POA: Insufficient documentation

## 2015-06-06 DIAGNOSIS — Y998 Other external cause status: Secondary | ICD-10-CM | POA: Diagnosis not present

## 2015-06-06 DIAGNOSIS — I890 Lymphedema, not elsewhere classified: Secondary | ICD-10-CM | POA: Insufficient documentation

## 2015-06-06 DIAGNOSIS — S8992XA Unspecified injury of left lower leg, initial encounter: Secondary | ICD-10-CM | POA: Diagnosis not present

## 2015-06-06 DIAGNOSIS — F17210 Nicotine dependence, cigarettes, uncomplicated: Secondary | ICD-10-CM | POA: Diagnosis not present

## 2015-06-06 DIAGNOSIS — R5383 Other fatigue: Secondary | ICD-10-CM | POA: Insufficient documentation

## 2015-06-06 DIAGNOSIS — R400 Somnolence: Secondary | ICD-10-CM

## 2015-06-06 DIAGNOSIS — Z7982 Long term (current) use of aspirin: Secondary | ICD-10-CM | POA: Diagnosis not present

## 2015-06-06 DIAGNOSIS — Z8669 Personal history of other diseases of the nervous system and sense organs: Secondary | ICD-10-CM | POA: Diagnosis not present

## 2015-06-06 DIAGNOSIS — Z79899 Other long term (current) drug therapy: Secondary | ICD-10-CM | POA: Diagnosis not present

## 2015-06-06 DIAGNOSIS — Y9289 Other specified places as the place of occurrence of the external cause: Secondary | ICD-10-CM | POA: Diagnosis not present

## 2015-06-06 DIAGNOSIS — M25562 Pain in left knee: Secondary | ICD-10-CM

## 2015-06-06 DIAGNOSIS — Y9389 Activity, other specified: Secondary | ICD-10-CM | POA: Diagnosis not present

## 2015-06-06 LAB — CBC WITH DIFFERENTIAL/PLATELET
BASOS ABS: 0 10*3/uL (ref 0.0–0.1)
BASOS PCT: 1 %
EOS ABS: 0.1 10*3/uL (ref 0.0–0.7)
Eosinophils Relative: 1 %
HEMATOCRIT: 37.3 % — AB (ref 39.0–52.0)
HEMOGLOBIN: 12.5 g/dL — AB (ref 13.0–17.0)
Lymphocytes Relative: 24 %
Lymphs Abs: 1.6 10*3/uL (ref 0.7–4.0)
MCH: 28.9 pg (ref 26.0–34.0)
MCHC: 33.5 g/dL (ref 30.0–36.0)
MCV: 86.3 fL (ref 78.0–100.0)
Monocytes Absolute: 0.4 10*3/uL (ref 0.1–1.0)
Monocytes Relative: 6 %
NEUTROS ABS: 4.6 10*3/uL (ref 1.7–7.7)
NEUTROS PCT: 68 %
Platelets: 265 10*3/uL (ref 150–400)
RBC: 4.32 MIL/uL (ref 4.22–5.81)
RDW: 14.4 % (ref 11.5–15.5)
WBC: 6.8 10*3/uL (ref 4.0–10.5)

## 2015-06-06 LAB — COMPREHENSIVE METABOLIC PANEL
ALBUMIN: 3.2 g/dL — AB (ref 3.5–5.0)
ALK PHOS: 77 U/L (ref 38–126)
ALT: 20 U/L (ref 17–63)
ANION GAP: 11 (ref 5–15)
AST: 22 U/L (ref 15–41)
BUN: 13 mg/dL (ref 6–20)
CO2: 26 mmol/L (ref 22–32)
Calcium: 8.7 mg/dL — ABNORMAL LOW (ref 8.9–10.3)
Chloride: 105 mmol/L (ref 101–111)
Creatinine, Ser: 0.8 mg/dL (ref 0.61–1.24)
GFR calc Af Amer: >60 mL/min (ref >60)
GFR calc non Af Amer: >60 mL/min (ref >60)
GLUCOSE: 88 mg/dL (ref 65–99)
Potassium: 3.9 mmol/L (ref 3.5–5.1)
SODIUM: 142 mmol/L (ref 135–145)
Total Bilirubin: 0.3 mg/dL (ref 0.3–1.2)
Total Protein: 6.7 g/dL (ref 6.5–8.1)

## 2015-06-06 LAB — I-STAT CG4 LACTIC ACID, ED: LACTIC ACID, VENOUS: 0.62 mmol/L (ref 0.5–2.0)

## 2015-06-06 LAB — AMMONIA: Ammonia: <9 umol/L — ABNORMAL LOW (ref 9–35)

## 2015-06-06 LAB — CBG MONITORING, ED: GLUCOSE-CAPILLARY: 82 mg/dL (ref 65–99)

## 2015-06-06 MED ORDER — ACETAMINOPHEN 325 MG PO TABS
975.0000 mg | ORAL_TABLET | Freq: Once | ORAL | Status: AC
  Administered 2015-06-06: 975 mg via ORAL
  Filled 2015-06-06: qty 3

## 2015-06-06 NOTE — ED Notes (Signed)
Pt left with urinal to try and provide a sample.

## 2015-06-06 NOTE — ED Notes (Signed)
Pt has difficulty walking. Pt stops in hallway. Wheel chair brought to pt. MD made aware of difficulty ambulating. Pt states he is trying to get into assisted livign. Case worker called to pt in hallway.

## 2015-06-06 NOTE — ED Notes (Signed)
Pt brought in EMS for bilateral lower edema and a fall today due to weakness.  Pt denies neck or back pain or LOC.  Pt c/o leg pain from fall.

## 2015-06-06 NOTE — ED Notes (Signed)
Pt returns from CT where pt refused study because he does not feel he can lay flat. When asked why he states his body "wont let him. I panic when I lay flat,."

## 2015-06-06 NOTE — ED Notes (Signed)
Ice applied to left knee
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
## 2015-06-06 NOTE — Care Management (Addendum)
ED NCM met with patient in hallway on Pod A, patient requesting to be placed in ALF, patient reports having Medicaid but without income at present. He is Homeless staying at [facility], and receives medical care at the [facility] with Marliss Coots NP.  Patient instructed to follow up with [facility] Case Worker regarding finding  permanent housing. Teach back done. Patient provided permission to contact [facility] to inform of his return this evening. Patient has bus pass for return. No further ED CM needs identified.

## 2015-06-06 NOTE — ED Notes (Signed)
CBG 82 

## 2015-06-06 NOTE — ED Provider Notes (Signed)
MSE was initiated and I personally evaluated the patient and placed orders (if any) at  2:25 PM on June 06, 2015.  The patient appears stable so that the remainder of the MSE may be completed by another provider.  She reports feeling generally weak, he has issues with ambulation secondary to his severe chronic bilateral lower extremity lymphedema, [place]. There was no head trauma, he denies cervicalgia, chest pain, abdominal pain, shortness of breath he has a mild left knee pain but good active range of motion.  LSCTA b/l; Heart is RRR; Abd without TTP, guarding or rebound, MAE, goal oriented speech. Severe bilateral lower extremity lymphedema, Unna boots in place.    Wynetta Emery, PA-C 06/06/15 1430  Alvira Monday, MD 06/06/15 2333

## 2015-06-12 ENCOUNTER — Telehealth: Payer: Self-pay | Admitting: Clinical

## 2015-06-12 NOTE — Telephone Encounter (Signed)
Third attempt to f/u with [name], left HIPPA-compliant message to call [name] from [employer] at [phone]. Attempt to advise patient to make certain he attends his next disability hearing. If he misses the next hearing, he is at risk of losing his Medicaid.  may help with transportation to disability hearing, if we hear back from [name] concerning the date and time of said hearing, so that transportation may be arranged in a timely manner.

## 2015-06-19 ENCOUNTER — Emergency Department (HOSPITAL_COMMUNITY)
Admission: EM | Admit: 2015-06-19 | Discharge: 2015-06-20 | Disposition: A | Discharge status: Home / Self Care | Payer: Medicaid Other | Attending: Emergency Medicine | Admitting: Emergency Medicine

## 2015-06-19 ENCOUNTER — Encounter (HOSPITAL_COMMUNITY): Payer: Self-pay | Admitting: Emergency Medicine

## 2015-06-19 DIAGNOSIS — W19XXXA Unspecified fall, initial encounter: Secondary | ICD-10-CM

## 2015-06-19 DIAGNOSIS — R2243 Localized swelling, mass and lump, lower limb, bilateral: Secondary | ICD-10-CM | POA: Diagnosis not present

## 2015-06-19 DIAGNOSIS — Z7982 Long term (current) use of aspirin: Secondary | ICD-10-CM | POA: Diagnosis not present

## 2015-06-19 DIAGNOSIS — Z5900 Homelessness unspecified: Secondary | ICD-10-CM

## 2015-06-19 DIAGNOSIS — Y998 Other external cause status: Secondary | ICD-10-CM | POA: Insufficient documentation

## 2015-06-19 DIAGNOSIS — Y92002 Bathroom of unspecified non-institutional (private) residence single-family (private) house as the place of occurrence of the external cause: Secondary | ICD-10-CM | POA: Diagnosis not present

## 2015-06-19 DIAGNOSIS — F17210 Nicotine dependence, cigarettes, uncomplicated: Secondary | ICD-10-CM | POA: Insufficient documentation

## 2015-06-19 DIAGNOSIS — Z8669 Personal history of other diseases of the nervous system and sense organs: Secondary | ICD-10-CM | POA: Diagnosis not present

## 2015-06-19 DIAGNOSIS — Z043 Encounter for examination and observation following other accident: Secondary | ICD-10-CM | POA: Diagnosis not present

## 2015-06-19 DIAGNOSIS — R2689 Other abnormalities of gait and mobility: Secondary | ICD-10-CM | POA: Insufficient documentation

## 2015-06-19 DIAGNOSIS — Y9389 Activity, other specified: Secondary | ICD-10-CM | POA: Diagnosis not present

## 2015-06-19 DIAGNOSIS — I10 Essential (primary) hypertension: Secondary | ICD-10-CM | POA: Diagnosis not present

## 2015-06-19 DIAGNOSIS — Z79899 Other long term (current) drug therapy: Secondary | ICD-10-CM | POA: Diagnosis not present

## 2015-06-19 DIAGNOSIS — W1839XA Other fall on same level, initial encounter: Secondary | ICD-10-CM | POA: Insufficient documentation

## 2015-06-19 NOTE — ED Notes (Addendum)
Per EMS, pt fell in the bathroom, landed on his sacrum. Did not hit head, not on blood thinners, did not hit head, did not lose consciousness, strong pulses in all extremities. Pt has not taken lasix or metoprolol d/t unable to afford them. Pt has no complaints after fall. States he got dismissed from the shelter tonight and didn't have anywhere else to go.

## 2015-06-20 NOTE — Discharge Instructions (Signed)
Fall Prevention in the Home  Falls can cause injuries and can affect people from all age groups. There are many simple things that you can do to make your home safe and to help prevent falls. WHAT CAN I DO ON THE OUTSIDE OF MY HOME?  Regularly repair the edges of walkways and driveways and fix any cracks.  Remove high doorway thresholds.  Trim any shrubbery on the main path into your home.  Use bright outdoor lighting.  Clear walkways of debris and clutter, including tools and rocks.  Regularly check that handrails are securely fastened and in good repair. Both sides of any steps should have handrails.  Install guardrails along the edges of any raised decks or porches.  Have leaves, snow, and ice cleared regularly.  Use sand or salt on walkways during winter months.  In the garage, clean up any spills right away, including grease or oil spills. WHAT CAN I DO IN THE BATHROOM?  Use night lights.  Install grab bars by the toilet and in the tub and shower. Do not use towel bars as grab bars.  Use non-skid mats or decals on the floor of the tub or shower.  If you need to sit down while you are in the shower, use a plastic, non-slip stool..  Keep the floor dry. Immediately clean up any water that spills on the floor.  Remove soap buildup in the tub or shower on a regular basis.  Attach bath mats securely with double-sided non-slip rug tape.  Remove throw rugs and other tripping hazards from the floor. WHAT CAN I DO IN THE BEDROOM?  Use night lights.  Make sure that a bedside light is easy to reach.  Do not use oversized bedding that drapes onto the floor.  Have a firm chair that has side arms to use for getting dressed.  Remove throw rugs and other tripping hazards from the floor. WHAT CAN I DO IN THE KITCHEN?   Clean up any spills right away.  Avoid walking on wet floors.  Place frequently used items in easy-to-reach places.  If you need to reach for something  above you, use a sturdy step stool that has a grab bar.  Keep electrical cables out of the way.  Do not use floor polish or wax that makes floors slippery. If you have to use wax, make sure that it is non-skid floor wax.  Remove throw rugs and other tripping hazards from the floor. WHAT CAN I DO IN THE STAIRWAYS?  Do not leave any items on the stairs.  Make sure that there are handrails on both sides of the stairs. Fix handrails that are broken or loose. Make sure that handrails are as long as the stairways.  Check any carpeting to make sure that it is firmly attached to the stairs. Fix any carpet that is loose or worn.  Avoid having throw rugs at the top or bottom of stairways, or secure the rugs with carpet tape to prevent them from moving.  Make sure that you have a light switch at the top of the stairs and the bottom of the stairs. If you do not have them, have them installed. WHAT ARE SOME OTHER FALL PREVENTION TIPS?  Wear closed-toe shoes that fit well and support your feet. Wear shoes that have rubber soles or low heels.  When you use a stepladder, make sure that it is completely opened and that the sides are firmly locked. Have someone hold the ladder while you   are using it. Do not climb a closed stepladder.  Add color or contrast paint or tape to grab bars and handrails in your home. Place contrasting color strips on the first and last steps.  Use mobility aids as needed, such as canes, walkers, scooters, and crutches.  Turn on lights if it is dark. Replace any light bulbs that burn out.  Set up furniture so that there are clear paths. Keep the furniture in the same spot.  Fix any uneven floor surfaces.  Choose a carpet design that does not hide the edge of steps of a stairway.  Be aware of any and all pets.  Review your medicines with your healthcare provider. Some medicines can cause dizziness or changes in blood pressure, which increase your risk of falling. Talk  with your health care provider about other ways that you can decrease your risk of falls. This may include working with a physical therapist or trainer to improve your strength, balance, and endurance.   This information is not intended to replace advice given to you by your health care provider. Make sure you discuss any questions you have with your health care provider.   Document Released: 04/19/2002 Document Revised: 09/13/2014 Document Reviewed: 06/03/2014 Elsevier Interactive Patient Education 2016 [employer] Inc[employer]  
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
## 2015-06-20 NOTE — ED Notes (Signed)
Pt provided with sandwich and drink after PA verbalized this was okay to do

## 2015-06-21 ENCOUNTER — Emergency Department (HOSPITAL_COMMUNITY)
Admission: EM | Admit: 2015-06-21 | Discharge: 2015-06-21 | Disposition: A | Discharge status: Home / Self Care | Payer: Medicaid Other | Attending: Emergency Medicine | Admitting: Emergency Medicine

## 2015-06-21 ENCOUNTER — Encounter (HOSPITAL_COMMUNITY): Payer: Self-pay

## 2015-06-21 DIAGNOSIS — I10 Essential (primary) hypertension: Secondary | ICD-10-CM | POA: Insufficient documentation

## 2015-06-21 DIAGNOSIS — F17210 Nicotine dependence, cigarettes, uncomplicated: Secondary | ICD-10-CM | POA: Diagnosis not present

## 2015-06-21 DIAGNOSIS — Z590 Homelessness: Secondary | ICD-10-CM | POA: Insufficient documentation

## 2015-06-21 DIAGNOSIS — R2243 Localized swelling, mass and lump, lower limb, bilateral: Secondary | ICD-10-CM | POA: Diagnosis present

## 2015-06-21 DIAGNOSIS — M7989 Other specified soft tissue disorders: Secondary | ICD-10-CM

## 2015-06-21 DIAGNOSIS — Z87828 Personal history of other (healed) physical injury and trauma: Secondary | ICD-10-CM | POA: Insufficient documentation

## 2015-06-21 DIAGNOSIS — Z9889 Other specified postprocedural states: Secondary | ICD-10-CM | POA: Insufficient documentation

## 2015-06-21 DIAGNOSIS — Z79899 Other long term (current) drug therapy: Secondary | ICD-10-CM | POA: Insufficient documentation

## 2015-06-21 DIAGNOSIS — R531 Weakness: Secondary | ICD-10-CM | POA: Diagnosis not present

## 2015-06-21 DIAGNOSIS — Z7982 Long term (current) use of aspirin: Secondary | ICD-10-CM | POA: Insufficient documentation

## 2015-06-21 DIAGNOSIS — R29898 Other symptoms and signs involving the musculoskeletal system: Secondary | ICD-10-CM

## 2015-06-21 DIAGNOSIS — Z8669 Personal history of other diseases of the nervous system and sense organs: Secondary | ICD-10-CM | POA: Diagnosis not present

## 2015-06-21 LAB — CBC
HEMATOCRIT: 37 % — AB (ref 39.0–52.0)
HEMOGLOBIN: 12.2 g/dL — AB (ref 13.0–17.0)
MCH: 28.8 pg (ref 26.0–34.0)
MCHC: 33 g/dL (ref 30.0–36.0)
MCV: 87.5 fL (ref 78.0–100.0)
Platelets: 266 10*3/uL (ref 150–400)
RBC: 4.23 MIL/uL (ref 4.22–5.81)
RDW: 14.9 % (ref 11.5–15.5)
WBC: 5.8 10*3/uL (ref 4.0–10.5)

## 2015-06-21 LAB — COMPREHENSIVE METABOLIC PANEL
ALBUMIN: 3.6 g/dL (ref 3.5–5.0)
ALK PHOS: 73 U/L (ref 38–126)
ALT: 19 U/L (ref 17–63)
ANION GAP: 9 (ref 5–15)
AST: 17 U/L (ref 15–41)
BUN: 12 mg/dL (ref 6–20)
CO2: 25 mmol/L (ref 22–32)
Calcium: 8.8 mg/dL — ABNORMAL LOW (ref 8.9–10.3)
Chloride: 103 mmol/L (ref 101–111)
Creatinine, Ser: 0.79 mg/dL (ref 0.61–1.24)
GFR calc Af Amer: >60 mL/min (ref >60)
GFR calc non Af Amer: >60 mL/min (ref >60)
GLUCOSE: 90 mg/dL (ref 65–99)
POTASSIUM: 3.8 mmol/L (ref 3.5–5.1)
SODIUM: 137 mmol/L (ref 135–145)
Total Bilirubin: 0.7 mg/dL (ref 0.3–1.2)
Total Protein: 7 g/dL (ref 6.5–8.1)
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
## 2015-06-21 NOTE — ED Notes (Signed)
Pt telling contradicting stories about why he is here.  When this RN attempted to clarify, Pt came irritated and sts "it's because this delirium."  Pt sts he has been living "where ever he can find."

## 2015-06-21 NOTE — ED Notes (Signed)
PA at bedside.

## 2015-06-21 NOTE — Discharge Instructions (Signed)
There does not appear to be an emergent cause for your symptoms at this time. You May use your Ace wrap to help with swelling. Please continue taking your medication to help with your swelling. Follow-up with your doctor as needed. Return to ED for new or worsening symptoms.  Edema Edema is an abnormal buildup of fluids in your bodytissues. Edema is somewhatdependent on gravity to pull the fluid to the lowest place in your body. That makes the condition more common in the legs and thighs (lower extremities). Painless swelling of the feet and ankles is common and becomes more likely as you get older. It is also common in looser tissues, like around your eyes.  When the affected area is squeezed, the fluid may move out of that spot and leave a dent for a few moments. This dent is called pitting.  CAUSES  There are many possible causes of edema. Eating too much salt and being on your feet or sitting for a long time can cause edema in your legs and ankles. Hot weather may make edema worse. Common medical causes of edema include:  Heart failure.  Liver disease.  Kidney disease.  Weak blood vessels in your legs.  Cancer.  An injury.  Pregnancy.  Some medications.  Obesity. SYMPTOMS  Edema is usually painless.Your skin may look swollen or shiny.  DIAGNOSIS  Your health care provider may be able to diagnose edema by asking about your medical history and doing a physical exam. You may need to have tests such as X-rays, an electrocardiogram, or blood tests to check for medical conditions that may cause edema.  TREATMENT  Edema treatment depends on the cause. If you have heart, liver, or kidney disease, you need the treatment appropriate for these conditions. General treatment may include:  Elevation of the affected body part above the level of your heart.  Compression of the affected body part. Pressure from elastic bandages or support stockings squeezes the tissues and forces fluid back  into the blood vessels. This keeps fluid from entering the tissues.  Restriction of fluid and salt intake.  Use of a water pill (diuretic). These medications are appropriate only for some types of edema. They pull fluid out of your body and make you urinate more often. This gets rid of fluid and reduces swelling, but diuretics can have side effects. Only use diuretics as directed by your health care provider. HOME CARE INSTRUCTIONS   Keep the affected body part above the level of your heart when you are lying down.   Do not sit still or stand for prolonged periods.   Do not put anything directly under your knees when lying down.  Do not wear constricting clothing or garters on your upper legs.   Exercise your legs to work the fluid back into your blood vessels. This may help the swelling go down.   Wear elastic bandages or support stockings to reduce ankle swelling as directed by your health care provider.   Eat a low-salt diet to reduce fluid if your health care provider recommends it.   Only take medicines as directed by your health care provider. SEEK MEDICAL CARE IF:   Your edema is not responding to treatment.  You have heart, liver, or kidney disease and notice symptoms of edema.  You have edema in your legs that does not improve after elevating them.   You have sudden and unexplained weight gain. SEEK IMMEDIATE MEDICAL CARE IF:   You develop shortness of breath  or chest pain.   You cannot breathe when you lie down.  You develop pain, redness, or warmth in the swollen areas.   You have heart, liver, or kidney disease and suddenly get edema.  You have a fever and your symptoms suddenly get worse. MAKE SURE YOU:   Understand these instructions.  Will watch your condition.  Will get help right away if you are not doing well or get worse.   This information is not intended to replace advice given to you by your health care provider. Make sure you discuss  any questions you have with your health care provider.   Document Released: 04/29/2005 Document Revised: 05/20/2014 Document Reviewed: 02/19/2013 Elsevier Interactive Patient Education [employer]

## 2015-06-23 ENCOUNTER — Encounter (HOSPITAL_COMMUNITY): Payer: Self-pay

## 2015-06-23 ENCOUNTER — Emergency Department (HOSPITAL_COMMUNITY): Payer: Medicaid Other

## 2015-06-23 ENCOUNTER — Emergency Department (HOSPITAL_COMMUNITY)
Admission: EM | Admit: 2015-06-23 | Discharge: 2015-06-23 | Disposition: A | Discharge status: Home / Self Care | Payer: Medicaid Other | Attending: Emergency Medicine | Admitting: Emergency Medicine

## 2015-06-23 DIAGNOSIS — F17210 Nicotine dependence, cigarettes, uncomplicated: Secondary | ICD-10-CM | POA: Diagnosis not present

## 2015-06-23 DIAGNOSIS — I10 Essential (primary) hypertension: Secondary | ICD-10-CM | POA: Diagnosis not present

## 2015-06-23 DIAGNOSIS — Z8669 Personal history of other diseases of the nervous system and sense organs: Secondary | ICD-10-CM | POA: Insufficient documentation

## 2015-06-23 DIAGNOSIS — R2243 Localized swelling, mass and lump, lower limb, bilateral: Secondary | ICD-10-CM | POA: Diagnosis present

## 2015-06-23 DIAGNOSIS — Z87828 Personal history of other (healed) physical injury and trauma: Secondary | ICD-10-CM | POA: Insufficient documentation

## 2015-06-23 DIAGNOSIS — Z79899 Other long term (current) drug therapy: Secondary | ICD-10-CM | POA: Insufficient documentation

## 2015-06-23 DIAGNOSIS — M7989 Other specified soft tissue disorders: Secondary | ICD-10-CM

## 2015-06-23 DIAGNOSIS — Z7982 Long term (current) use of aspirin: Secondary | ICD-10-CM | POA: Diagnosis not present

## 2015-06-23 LAB — CBC
HCT: 39.3 % (ref 39.0–52.0)
HEMOGLOBIN: 12.5 g/dL — AB (ref 13.0–17.0)
MCH: 28.1 pg (ref 26.0–34.0)
MCHC: 31.8 g/dL (ref 30.0–36.0)
MCV: 88.3 fL (ref 78.0–100.0)
Platelets: 257 10*3/uL (ref 150–400)
RBC: 4.45 MIL/uL (ref 4.22–5.81)
RDW: 15 % (ref 11.5–15.5)
WBC: 5.2 10*3/uL (ref 4.0–10.5)

## 2015-06-23 LAB — BASIC METABOLIC PANEL
ANION GAP: 11 (ref 5–15)
BUN: 13 mg/dL (ref 6–20)
CHLORIDE: 102 mmol/L (ref 101–111)
CO2: 29 mmol/L (ref 22–32)
Calcium: 9 mg/dL (ref 8.9–10.3)
Creatinine, Ser: 0.82 mg/dL (ref 0.61–1.24)
Glucose, Bld: 93 mg/dL (ref 65–99)
POTASSIUM: 4.4 mmol/L (ref 3.5–5.1)
SODIUM: 142 mmol/L (ref 135–145)

## 2015-06-23 LAB — BRAIN NATRIURETIC PEPTIDE: B NATRIURETIC PEPTIDE 5: 8.9 pg/mL (ref 0.0–100.0)

## 2015-06-23 MED ORDER — FUROSEMIDE 10 MG/ML IJ SOLN
80.0000 mg | Freq: Once | INTRAMUSCULAR | Status: AC
  Administered 2015-06-23: 80 mg via INTRAVENOUS
  Filled 2015-06-23: qty 8

## 2015-06-23 NOTE — Care Management (Signed)
ED CM met with patient concerning follow up. Patient reports recent approval for [facility]. Patient is chronically homeless without health insurance and was being followed by Audrea Muscat NP at the [facility] day shelter. Discussed [place] and how patient would benefit from establishing continuity care, he is agreeable. Also discussed follow up for [facility] evaluation LE patient is agreeable, CM will follow up on Monday call and schedule appointment at the wound clinic. Patient provided permission to leave message for appointment with [facility].Marland Kitchen Provided patient with information for Medicaid Transportation instructed patient ask for assistance scheduling appt for transportation. Teach back done patient verbalized understanding, No further ED CM needs identified.

## 2015-06-23 NOTE — Progress Notes (Signed)
[name] spoke with pt about his homelessness.  Per pt, he has been "chronically" homeless for 10 years and that he stays outside most of the time.  Pt admits to using the [facility] shelters when they are operating, but has never used shelters for longer term housing.  Pt's disability claim is in the appeals process and he is being represented by the [employer] through with assistance through the [facility].  Unfortunately, pt doesn't currently have/and will not have any income to secure housing until his disability is approved.  Pt very familiar with various social services in the area, and denies any support system amongst friends/family.  Pt was recently approved for Medicaid and is currently in need of a PCP.  RNCM consulted.  Emotional support provided to pt.

## 2015-06-23 NOTE — ED Notes (Signed)
Per EMS: Pt seen recently at [facility] to have legs wrapped. Pt complaining of increased swelling since then. Pt has ongoing bilateral edema. No hx CHF. Taking lasix PO, no effect. Pt asking to have legs re-wrapped today.
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
## 2015-06-23 NOTE — Discharge Instructions (Signed)
Increase your Lasix (furosemide) dose to 80 mg, or 2 tablets in the morning and 1 tablet at night. Keep her legs elevated above your heart as much as possible. Keep your scheduled appointment at the [facility] and community wellness center. Return if concerned for any reason

## 2015-06-23 NOTE — ED Provider Notes (Signed)
Patient complains of bilateral leg edema for 1 year stating it's worse over the past 2 months. No treatment prior to coming here he is presently homeless. On exam no distress neck without JVD lungs clear to auscultation heart regular rate and rhythm abdomen nondistended nontender genitalia normal male. No scrotal edema. Bilateral lower extremities with 4+ pretibial pitting edema. Results for orders placed or performed during the hospital encounter of 06/23/15  CBC  Result Value Ref Range   WBC 5.2 4.0 - 10.5 K/uL   RBC 4.45 4.22 - 5.81 MIL/uL   Hemoglobin 12.5 (L) 13.0 - 17.0 g/dL   HCT 16.1 09.6 - 04.5 %   MCV 88.3 78.0 - 100.0 fL   MCH 28.1 26.0 - 34.0 pg   MCHC 31.8 30.0 - 36.0 g/dL   RDW 40.9 81.1 - 91.4 %   Platelets 257 150 - 400 K/uL  Basic metabolic panel  Result Value Ref Range   Sodium 142 135 - 145 mmol/L   Potassium 4.4 3.5 - 5.1 mmol/L   Chloride 102 101 - 111 mmol/L   CO2 29 22 - 32 mmol/L   Glucose, Bld 93 65 - 99 mg/dL   BUN 13 6 - 20 mg/dL   Creatinine, Ser 7.82 0.61 - 1.24 mg/dL   Calcium 9.0 8.9 - 95.6 mg/dL   GFR calc non Af Amer >60 >60 mL/min   GFR calc Af Amer >60 >60 mL/min   Anion gap 11 5 - 15  Brain natriuretic peptide  Result Value Ref Range   B Natriuretic Peptide 8.9 0.0 - 100.0 pg/mL   Dg Chest 2 View  06/23/2015  CLINICAL DATA:  Shortness of Breath EXAM: CHEST  2 VIEW COMPARISON:  June 06, 2015 FINDINGS: There is no edema or consolidation. Heart is borderline enlarged with pulmonary vascularity within normal limits. No adenopathy. No bone lesions. There is mild degenerative change in the thoracic spine. IMPRESSION: Heart borderline enlarged.  No edema or consolidation. Electronically Signed   By: Bretta Bang III M.D.   On: 06/23/2015 18:42   Dg Chest 2 View  06/06/2015  CLINICAL DATA:  Lower extremity edema, possible fall, extremity weakness, patient bleed CT has had multiple mini strokes, history hypertension, stroke, smoking EXAM: CHEST  2  VIEW COMPARISON:  04/06/2015 FINDINGS: Upper normal heart size. Tortuous aorta. Mediastinal contours and pulmonary vascularity normal. Minimal chronic RIGHT basilar atelectasis. Lungs otherwise clear. No pulmonary infiltrate, pleural effusion or pneumothorax. Osseous structures unremarkable. IMPRESSION: Minimal chronic RIGHT basilar atelectasis without acute infiltrate. Electronically Signed   By: Ulyses Southward M.D.   On: 06/06/2015 16:21   Dg Knee Complete 4 Views Left  06/06/2015  CLINICAL DATA:  Left knee pain, fall today EXAM: LEFT KNEE - COMPLETE 4+ VIEW COMPARISON:  08/07/2012 FINDINGS: Four views of the left knee submitted. No acute fracture or subluxation. No radiopaque foreign body. Diffuse mild narrowing of joint space. IMPRESSION: No acute fracture or subluxation. Mild degenerative changes with diffuse mild narrowing of joint space. Electronically Signed   By: Natasha Mead M.D.   On: 06/06/2015 15:23   Plan will increase lasix form 40 mg bid to 80 mg in am , 40 mg at night Care manger consuted and arranged for f/uiu at [facility] wellness ctr and wound management and found pt place to stay [place] tom[place] Dx # 1 chronic leg edema  Doug Sou, MD 06/23/15 2117

## 2015-06-28 ENCOUNTER — Emergency Department (HOSPITAL_COMMUNITY)
Admission: EM | Admit: 2015-06-28 | Discharge: 2015-06-28 | Disposition: A | Discharge status: Home / Self Care | Payer: Medicaid Other | Attending: Emergency Medicine | Admitting: Emergency Medicine

## 2015-06-28 ENCOUNTER — Encounter (HOSPITAL_COMMUNITY): Payer: Self-pay | Admitting: Emergency Medicine

## 2015-06-28 DIAGNOSIS — Z87828 Personal history of other (healed) physical injury and trauma: Secondary | ICD-10-CM | POA: Diagnosis not present

## 2015-06-28 DIAGNOSIS — Z79899 Other long term (current) drug therapy: Secondary | ICD-10-CM | POA: Insufficient documentation

## 2015-06-28 DIAGNOSIS — Z7982 Long term (current) use of aspirin: Secondary | ICD-10-CM | POA: Diagnosis not present

## 2015-06-28 DIAGNOSIS — I10 Essential (primary) hypertension: Secondary | ICD-10-CM | POA: Insufficient documentation

## 2015-06-28 DIAGNOSIS — R2243 Localized swelling, mass and lump, lower limb, bilateral: Secondary | ICD-10-CM | POA: Diagnosis present

## 2015-06-28 DIAGNOSIS — I890 Lymphedema, not elsewhere classified: Secondary | ICD-10-CM | POA: Diagnosis not present

## 2015-06-28 DIAGNOSIS — F17210 Nicotine dependence, cigarettes, uncomplicated: Secondary | ICD-10-CM | POA: Insufficient documentation

## 2015-06-28 DIAGNOSIS — Z8669 Personal history of other diseases of the nervous system and sense organs: Secondary | ICD-10-CM | POA: Diagnosis not present

## 2015-06-28 DIAGNOSIS — Z950 Presence of cardiac pacemaker: Secondary | ICD-10-CM | POA: Insufficient documentation

## 2015-06-28 HISTORY — DX: Lymphedema, not elsewhere classified: I89.0

## 2015-06-28 HISTORY — DX: Homelessness unspecified: Z59.00

## 2015-06-28 HISTORY — DX: Homelessness: Z59.0

## 2015-06-28 LAB — COMPREHENSIVE METABOLIC PANEL
ALBUMIN: 3.5 g/dL (ref 3.5–5.0)
ALT: 18 U/L (ref 17–63)
ANION GAP: 9 (ref 5–15)
AST: 16 U/L (ref 15–41)
Alkaline Phosphatase: 79 U/L (ref 38–126)
BILIRUBIN TOTAL: 0.4 mg/dL (ref 0.3–1.2)
BUN: 13 mg/dL (ref 6–20)
CHLORIDE: 105 mmol/L (ref 101–111)
CO2: 28 mmol/L (ref 22–32)
Calcium: 9.1 mg/dL (ref 8.9–10.3)
Creatinine, Ser: 0.82 mg/dL (ref 0.61–1.24)
GFR calc Af Amer: >60 mL/min (ref >60)
Glucose, Bld: 94 mg/dL (ref 65–99)
POTASSIUM: 4.1 mmol/L (ref 3.5–5.1)
Sodium: 142 mmol/L (ref 135–145)
TOTAL PROTEIN: 7.2 g/dL (ref 6.5–8.1)

## 2015-06-28 LAB — CBC WITH DIFFERENTIAL/PLATELET
Basophils Absolute: 0 10*3/uL (ref 0.0–0.1)
Basophils Relative: 1 %
Eosinophils Absolute: 0.1 10*3/uL (ref 0.0–0.7)
Eosinophils Relative: 2 %
HEMATOCRIT: 39.2 % (ref 39.0–52.0)
HEMOGLOBIN: 12.7 g/dL — AB (ref 13.0–17.0)
LYMPHS ABS: 1.9 10*3/uL (ref 0.7–4.0)
LYMPHS PCT: 32 %
MCH: 28.7 pg (ref 26.0–34.0)
MCHC: 32.4 g/dL (ref 30.0–36.0)
MCV: 88.7 fL (ref 78.0–100.0)
MONOS PCT: 7 %
Monocytes Absolute: 0.4 10*3/uL (ref 0.1–1.0)
NEUTROS ABS: 3.5 10*3/uL (ref 1.7–7.7)
NEUTROS PCT: 58 %
Platelets: 272 10*3/uL (ref 150–400)
RBC: 4.42 MIL/uL (ref 4.22–5.81)
RDW: 14.8 % (ref 11.5–15.5)
WBC: 5.9 10*3/uL (ref 4.0–10.5)

## 2015-06-28 NOTE — Discharge Instructions (Signed)
Please call Asher Muir from [employer] at [phone] about your upcoming disability hearing.     Lymphedema Lymphedema is swelling that is caused by the abnormal collection of lymph under the skin. Lymph is fluid from the tissues in your body that travels in the lymphatic system. This system is part of the immune system and includes lymph nodes and lymph vessels. The lymph vessels collect and carry the excess fluid, fats, proteins, and wastes from the tissues of the body to the bloodstream. This system also works to clean and remove bacteria and waste products from the body. Lymphedema occurs when the lymphatic system is blocked. When the lymph vessels or lymph nodes are blocked or damaged, lymph does not drain properly, causing an abnormal buildup of lymph. This leads to swelling in the arms or legs. Lymphedema cannot be cured by medicines, but various methods can be used to help reduce the swelling. CAUSES There are two types of lymphedema. Primary lymphedema is caused by the absence or abnormality of the lymph vessel at birth. Secondary lymphedema is more common. It occurs when the lymph vessel is damaged or blocked. Common causes of lymph vessel blockage include:  Skin infection, such as cellulitis.  Infection by parasites (filariasis).  Injury.  Cancer.  Radiation therapy.  Formation of scar tissue.  Surgery. SYMPTOMS Symptoms of this condition include:  Swelling of the arm or leg.  A heavy or tight feeling in the arm or leg.  Swelling of the feet, toes, or fingers. Shoes or rings may fit more tightly than before.  Redness of the skin over the affected area.  Limited movement of the affected limb.  Sensitivity to touch or discomfort in the affected limb. DIAGNOSIS This condition may be diagnosed with:  A physical exam.  Medical history.  Imaging tests, such as:  Lymphoscintigraphy. In this test, a low dose of a radioactive substance is injected to trace the flow of lymph  through the lymph vessels.  MRI.  CT scan.  Duplex ultrasound. This test uses sound waves to produce images of the vessels and the blood flow on a screen.  Lymphangiography. In this test, a contrast dye is injected into the lymph vessel to help show blockages. TREATMENT Treatment for this condition may depend on the cause. Treatment may include:  Exercise. Certain exercises can help fluid move out of the affected limb.  Massage. Gentle massage of the affected limb can help move the fluid out of the area.  Compression. Various methods may be used to apply pressure to the affected limb in order to reduce the swelling.  Wearing compression stockings or sleeves on the affected limb.  Bandaging the affected limb.  Using an external pump that is attached to a sleeve that alternates between applying pressure and releasing pressure.  Surgery. This is usually only done for severe cases. For example, surgery may be done if you have trouble moving the limb or if the swelling does not get better with other treatments. If an underlying condition is causing the lymphedema, treatment for that condition is needed. For example, antibiotic medicines may be used to treat an infection. HOME CARE INSTRUCTIONS Activities  Exercise regularly as directed by your health care provider.  Do not sit with your legs crossed.  When possible, keep the affected limb raised (elevated) above the level of your heart.  Avoid carrying things with an arm that is affected by lymphedema.  Remember that the affected area is more likely to become injured or infected.  Take  these steps to help prevent infection:  Keep the affected area clean and dry.  Protect your skin from cuts. For example, you should use gloves while cooking or gardening. Do not walk barefoot. If you shave the affected area, use an Neurosurgeon. General Instructions  Take medicines only as directed by your health care provider.  Eat a  healthy diet that includes a lot of fruits and vegetables.  Do not wear tight clothes, shoes, or jewelry.  Do not use heating pads over the affected area.  Avoid having blood pressure checked on the affected limb.  Keep all follow-up visits as directed by your health care provider. This is important. SEEK MEDICAL CARE IF:  You continue to have swelling in your limb.  You have a fever.  You have a cut that does not heal.  You have redness or pain in the affected area.  You have new swelling in your limb that comes on suddenly.  You develop purplish spots or sores (lesions) on your limb. SEEK IMMEDIATE MEDICAL CARE IF:  You have a skin rash.  You have chills or sweats.  You have shortness of breath.   This information is not intended to replace advice given to you by your health care provider. Make sure you discuss any questions you have with your health care provider.   Document Released: 02/24/2007 Document Revised: 09/13/2014 Document Reviewed: 04/06/2014 Elsevier Interactive Patient Education [employer]

## 2015-06-28 NOTE — ED Notes (Signed)
Pt departed in NAD.  
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
## 2015-06-28 NOTE — ED Notes (Signed)
Pt. arrived with EMS from street reports increasing edema/swelling on both lower extremities this week , denies injury/no fever .

## 2015-07-06 ENCOUNTER — Encounter (HOSPITAL_COMMUNITY): Payer: Self-pay | Admitting: Emergency Medicine

## 2015-07-06 ENCOUNTER — Emergency Department (HOSPITAL_COMMUNITY)
Admission: EM | Admit: 2015-07-06 | Discharge: 2015-07-06 | Disposition: A | Discharge status: Home / Self Care | Payer: Medicaid Other | Attending: Emergency Medicine | Admitting: Emergency Medicine

## 2015-07-06 DIAGNOSIS — I10 Essential (primary) hypertension: Secondary | ICD-10-CM | POA: Insufficient documentation

## 2015-07-06 DIAGNOSIS — Z9119 Patient's noncompliance with other medical treatment and regimen: Secondary | ICD-10-CM | POA: Diagnosis not present

## 2015-07-06 DIAGNOSIS — Z87828 Personal history of other (healed) physical injury and trauma: Secondary | ICD-10-CM | POA: Insufficient documentation

## 2015-07-06 DIAGNOSIS — Z7982 Long term (current) use of aspirin: Secondary | ICD-10-CM | POA: Diagnosis not present

## 2015-07-06 DIAGNOSIS — R600 Localized edema: Secondary | ICD-10-CM

## 2015-07-06 DIAGNOSIS — Z590 Homelessness: Secondary | ICD-10-CM | POA: Diagnosis not present

## 2015-07-06 DIAGNOSIS — R2243 Localized swelling, mass and lump, lower limb, bilateral: Secondary | ICD-10-CM | POA: Diagnosis not present

## 2015-07-06 DIAGNOSIS — Z79899 Other long term (current) drug therapy: Secondary | ICD-10-CM | POA: Diagnosis not present

## 2015-07-06 DIAGNOSIS — R531 Weakness: Secondary | ICD-10-CM | POA: Diagnosis present

## 2015-07-06 DIAGNOSIS — F17210 Nicotine dependence, cigarettes, uncomplicated: Secondary | ICD-10-CM | POA: Insufficient documentation

## 2015-07-06 DIAGNOSIS — Z8669 Personal history of other diseases of the nervous system and sense organs: Secondary | ICD-10-CM | POA: Insufficient documentation

## 2015-07-06 LAB — URINALYSIS, ROUTINE W REFLEX MICROSCOPIC
Bilirubin Urine: NEGATIVE
GLUCOSE, UA: NEGATIVE mg/dL
HGB URINE DIPSTICK: NEGATIVE
KETONES UR: NEGATIVE mg/dL
LEUKOCYTES UA: NEGATIVE
Nitrite: NEGATIVE
PROTEIN: NEGATIVE mg/dL
Specific Gravity, Urine: 1.009 (ref 1.005–1.030)
pH: 5.5 (ref 5.0–8.0)

## 2015-07-06 LAB — BASIC METABOLIC PANEL
Anion gap: 7 (ref 5–15)
BUN: 10 mg/dL (ref 6–20)
CALCIUM: 8.6 mg/dL — AB (ref 8.9–10.3)
CHLORIDE: 103 mmol/L (ref 101–111)
CO2: 27 mmol/L (ref 22–32)
CREATININE: 0.79 mg/dL (ref 0.61–1.24)
GFR calc Af Amer: >60 mL/min (ref >60)
GFR calc non Af Amer: >60 mL/min (ref >60)
Glucose, Bld: 94 mg/dL (ref 65–99)
Potassium: 3.9 mmol/L (ref 3.5–5.1)
SODIUM: 137 mmol/L (ref 135–145)

## 2015-07-06 LAB — CBC
HCT: 37.8 % — ABNORMAL LOW (ref 39.0–52.0)
Hemoglobin: 12.8 g/dL — ABNORMAL LOW (ref 13.0–17.0)
MCH: 29.2 pg (ref 26.0–34.0)
MCHC: 33.9 g/dL (ref 30.0–36.0)
MCV: 86.3 fL (ref 78.0–100.0)
PLATELETS: 294 10*3/uL (ref 150–400)
RBC: 4.38 MIL/uL (ref 4.22–5.81)
RDW: 14.4 % (ref 11.5–15.5)
WBC: 6.7 10*3/uL (ref 4.0–10.5)

## 2015-07-06 NOTE — ED Notes (Signed)
Patient not in lobby

## 2015-07-06 NOTE — ED Notes (Signed)
Pt ambulated outside into ambulance bay. Pt ask to come back away from the vehicles and pt stated he needed to get to the bus stop. Pt now returns to the waiting room

## 2015-07-06 NOTE — ED Notes (Signed)
Patient ambulated with assistive device.

## 2015-07-06 NOTE — ED Notes (Signed)
Pt seen ambulating outside

## 2015-07-06 NOTE — ED Notes (Signed)
Per EMS-states decreased mobility for about a month, states right unilateral deficits for a month, has been evaluated-states has falling twice

## 2015-07-06 NOTE — ED Notes (Signed)
In addition to below note, pt reports that main concern is generalized weakness x 1 week. Pt's lower extremities severely edematous, pt states this is due to lymphedema. Pressure ulcer to left buttocks.

## 2015-07-06 NOTE — Discharge Instructions (Signed)
Take your medications as prescribed.  Peripheral Edema You have swelling in your legs (peripheral edema). This swelling is due to excess accumulation of salt and water in your body. Edema may be a sign of heart, kidney or liver disease, or a side effect of a medication. It may also be due to problems in the leg veins. Elevating your legs and using special support stockings may be very helpful, if the cause of the swelling is due to poor venous circulation. Avoid long periods of standing, whatever the cause. Treatment of edema depends on identifying the cause. Chips, pretzels, pickles and other salty foods should be avoided. Restricting salt in your diet is almost always needed. Water pills (diuretics) are often used to remove the excess salt and water from your body via urine. These medicines prevent the kidney from reabsorbing sodium. This increases urine flow. Diuretic treatment may also result in lowering of potassium levels in your body. Potassium supplements may be needed if you have to use diuretics daily. Daily weights can help you keep track of your progress in clearing your edema. You should call your caregiver for follow up care as recommended. SEEK IMMEDIATE MEDICAL CARE IF:   You have increased swelling, pain, redness, or heat in your legs.  You develop shortness of breath, especially when lying down.  You develop chest or abdominal pain, weakness, or fainting.  You have a fever.   This information is not intended to replace advice given to you by your health care provider. Make sure you discuss any questions you have with your health care provider.   Document Released: 06/06/2004 Document Revised: 07/22/2011 Document Reviewed: 11/09/2014 Elsevier Interactive Patient Education [employer]
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
## 2015-07-06 NOTE — ED Notes (Signed)
Discharge instructions and follow up care reviewed with patient. Patient verbalized understanding. 

## 2015-07-06 NOTE — ED Notes (Signed)
Ace wraps applied to bilateral legs per Dr. Madilyn Hook

## 2015-07-06 NOTE — Progress Notes (Signed)
[name] familiar with pt from previous ED visits.  Pt is chronically homeless and his disability is pending.  He does not have an income to pay for housing until his disability is approved.  He is very familiar with shelters and other social service agencies in [place].  Winter emergency shelters are not in operation this pm due to the unseasonably warm weather we are having.  Unfortunately, there is very little [name] can offer him at this time.  Shelter list faxed to pt and pt advised to f/u with the [facility] should he want shelter.  Pollyann Savoy, LCSW ED/Evening Coverage [phone]

## 2015-07-06 NOTE — ED Notes (Signed)
Patient ambulated from triage to room with walker.

## 2015-07-09 ENCOUNTER — Encounter (HOSPITAL_COMMUNITY): Payer: Self-pay | Admitting: Emergency Medicine

## 2015-07-09 ENCOUNTER — Emergency Department (HOSPITAL_COMMUNITY)
Admission: EM | Admit: 2015-07-09 | Discharge: 2015-07-09 | Disposition: A | Discharge status: Home / Self Care | Payer: Medicaid Other | Attending: Emergency Medicine | Admitting: Emergency Medicine

## 2015-07-09 DIAGNOSIS — W1839XA Other fall on same level, initial encounter: Secondary | ICD-10-CM | POA: Diagnosis not present

## 2015-07-09 DIAGNOSIS — Y9389 Activity, other specified: Secondary | ICD-10-CM | POA: Insufficient documentation

## 2015-07-09 DIAGNOSIS — Z8669 Personal history of other diseases of the nervous system and sense organs: Secondary | ICD-10-CM | POA: Insufficient documentation

## 2015-07-09 DIAGNOSIS — F17210 Nicotine dependence, cigarettes, uncomplicated: Secondary | ICD-10-CM | POA: Insufficient documentation

## 2015-07-09 DIAGNOSIS — Y998 Other external cause status: Secondary | ICD-10-CM | POA: Insufficient documentation

## 2015-07-09 DIAGNOSIS — Z7982 Long term (current) use of aspirin: Secondary | ICD-10-CM | POA: Diagnosis not present

## 2015-07-09 DIAGNOSIS — Z79899 Other long term (current) drug therapy: Secondary | ICD-10-CM | POA: Insufficient documentation

## 2015-07-09 DIAGNOSIS — Y9289 Other specified places as the place of occurrence of the external cause: Secondary | ICD-10-CM | POA: Insufficient documentation

## 2015-07-09 DIAGNOSIS — L98419 Non-pressure chronic ulcer of buttock with unspecified severity: Secondary | ICD-10-CM | POA: Diagnosis not present

## 2015-07-09 DIAGNOSIS — Z590 Homelessness: Secondary | ICD-10-CM | POA: Insufficient documentation

## 2015-07-09 DIAGNOSIS — I10 Essential (primary) hypertension: Secondary | ICD-10-CM | POA: Diagnosis not present

## 2015-07-09 DIAGNOSIS — R159 Full incontinence of feces: Secondary | ICD-10-CM | POA: Insufficient documentation

## 2015-07-09 DIAGNOSIS — E669 Obesity, unspecified: Secondary | ICD-10-CM | POA: Insufficient documentation

## 2015-07-09 DIAGNOSIS — W19XXXA Unspecified fall, initial encounter: Secondary | ICD-10-CM

## 2015-07-09 DIAGNOSIS — S01511A Laceration without foreign body of lip, initial encounter: Secondary | ICD-10-CM | POA: Diagnosis present

## 2015-07-09 NOTE — ED Notes (Signed)
Full Bed Bath Given To PT.

## 2015-07-09 NOTE — ED Notes (Signed)
N. Victory Dakin, [place],  at bedside.
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
## 2015-07-09 NOTE — ED Notes (Signed)
Pt arrives via [facility] reporting fall from standing this morning.  Pt reports losing control of bowels during fall, denies hx of same.  Pt reports hitting L side of face.  Small lac noted to L corner of mouth.  Neuro intact.  Pt AOx4.

## 2015-07-09 NOTE — Discharge Instructions (Signed)
[name],  Nice meeting you! Please follow-up with your primary care provider. Return to the emergency department if you develop fevers, chills, chest pain, abdominal pain, lose control of your bladder/bowel, have back pain. Feel better soon!  S. Lane Hacker, PA-C

## 2015-07-13 ENCOUNTER — Telehealth: Payer: Self-pay | Admitting: Surgery

## 2015-07-13 NOTE — Telephone Encounter (Signed)
CM contacted [name] at the [facility] with follow up appt 3/7/ 17 at 8 am with Dr. Glory [facility] for bilateral lymphedema evaluation.  No further ED CM needs identified

## 2015-07-14 ENCOUNTER — Encounter (HOSPITAL_COMMUNITY): Payer: Self-pay | Admitting: *Deleted

## 2015-07-14 DIAGNOSIS — Z79899 Other long term (current) drug therapy: Secondary | ICD-10-CM | POA: Insufficient documentation

## 2015-07-14 DIAGNOSIS — Z9119 Patient's noncompliance with other medical treatment and regimen: Secondary | ICD-10-CM | POA: Insufficient documentation

## 2015-07-14 DIAGNOSIS — Z7982 Long term (current) use of aspirin: Secondary | ICD-10-CM | POA: Insufficient documentation

## 2015-07-14 DIAGNOSIS — R600 Localized edema: Secondary | ICD-10-CM | POA: Insufficient documentation

## 2015-07-14 DIAGNOSIS — R32 Unspecified urinary incontinence: Secondary | ICD-10-CM | POA: Diagnosis present

## 2015-07-14 DIAGNOSIS — Z8669 Personal history of other diseases of the nervous system and sense organs: Secondary | ICD-10-CM | POA: Diagnosis not present

## 2015-07-14 DIAGNOSIS — Z590 Homelessness: Secondary | ICD-10-CM | POA: Diagnosis not present

## 2015-07-14 DIAGNOSIS — F17210 Nicotine dependence, cigarettes, uncomplicated: Secondary | ICD-10-CM | POA: Diagnosis not present

## 2015-07-14 DIAGNOSIS — I10 Essential (primary) hypertension: Secondary | ICD-10-CM | POA: Diagnosis not present

## 2015-07-14 DIAGNOSIS — Z87828 Personal history of other (healed) physical injury and trauma: Secondary | ICD-10-CM | POA: Diagnosis not present

## 2015-07-14 LAB — COMPREHENSIVE METABOLIC PANEL
ALBUMIN: 3.6 g/dL (ref 3.5–5.0)
ALT: 20 U/L (ref 17–63)
AST: 25 U/L (ref 15–41)
Alkaline Phosphatase: 72 U/L (ref 38–126)
Anion gap: 14 (ref 5–15)
BILIRUBIN TOTAL: 0.6 mg/dL (ref 0.3–1.2)
BUN: 10 mg/dL (ref 6–20)
CHLORIDE: 100 mmol/L — AB (ref 101–111)
CO2: 26 mmol/L (ref 22–32)
CREATININE: 0.81 mg/dL (ref 0.61–1.24)
Calcium: 9.3 mg/dL (ref 8.9–10.3)
GFR calc Af Amer: >60 mL/min (ref >60)
GLUCOSE: 92 mg/dL (ref 65–99)
Potassium: 4.1 mmol/L (ref 3.5–5.1)
Sodium: 140 mmol/L (ref 135–145)
TOTAL PROTEIN: 7.3 g/dL (ref 6.5–8.1)

## 2015-07-14 LAB — CBC WITH DIFFERENTIAL/PLATELET
BASOS ABS: 0.1 10*3/uL (ref 0.0–0.1)
Basophils Relative: 1 %
EOS PCT: 1 %
Eosinophils Absolute: 0.1 10*3/uL (ref 0.0–0.7)
HEMATOCRIT: 38.7 % — AB (ref 39.0–52.0)
HEMOGLOBIN: 12.9 g/dL — AB (ref 13.0–17.0)
LYMPHS PCT: 26 %
Lymphs Abs: 1.9 10*3/uL (ref 0.7–4.0)
MCH: 28.5 pg (ref 26.0–34.0)
MCHC: 33.3 g/dL (ref 30.0–36.0)
MCV: 85.6 fL (ref 78.0–100.0)
Monocytes Absolute: 0.5 10*3/uL (ref 0.1–1.0)
Monocytes Relative: 6 %
Neutro Abs: 5 10*3/uL (ref 1.7–7.7)
Neutrophils Relative %: 66 %
Platelets: 292 10*3/uL (ref 150–400)
RBC: 4.52 MIL/uL (ref 4.22–5.81)
RDW: 14.7 % (ref 11.5–15.5)
WBC: 7.5 10*3/uL (ref 4.0–10.5)

## 2015-07-14 LAB — BRAIN NATRIURETIC PEPTIDE: B NATRIURETIC PEPTIDE 5: 16.3 pg/mL (ref 0.0–100.0)

## 2015-07-14 NOTE — ED Notes (Signed)
The pt is c/o bowel and bladder out of control.  For the past 2 days the pt has been incontinent of both.  He reports that he stopped taking his lasix because he could not control his bowels or bladder swollen legs and he has urine on his pants

## 2015-07-15 ENCOUNTER — Emergency Department (HOSPITAL_COMMUNITY): Payer: Medicaid Other

## 2015-07-15 ENCOUNTER — Emergency Department (HOSPITAL_COMMUNITY)
Admission: EM | Admit: 2015-07-15 | Discharge: 2015-07-15 | Disposition: A | Discharge status: Home / Self Care | Payer: Medicaid Other | Attending: Emergency Medicine | Admitting: Emergency Medicine

## 2015-07-15 DIAGNOSIS — Z9114 Patient's other noncompliance with medication regimen: Secondary | ICD-10-CM

## 2015-07-15 DIAGNOSIS — R399 Unspecified symptoms and signs involving the genitourinary system: Secondary | ICD-10-CM

## 2015-07-15 DIAGNOSIS — R600 Localized edema: Secondary | ICD-10-CM

## 2015-07-15 DIAGNOSIS — I10 Essential (primary) hypertension: Secondary | ICD-10-CM

## 2015-07-15 LAB — URINALYSIS, ROUTINE W REFLEX MICROSCOPIC
BILIRUBIN URINE: NEGATIVE
Glucose, UA: NEGATIVE mg/dL
HGB URINE DIPSTICK: NEGATIVE
Ketones, ur: NEGATIVE mg/dL
Leukocytes, UA: NEGATIVE
Nitrite: NEGATIVE
Protein, ur: NEGATIVE mg/dL
SPECIFIC GRAVITY, URINE: 1.021 (ref 1.005–1.030)
pH: 6.5 (ref 5.0–8.0)

## 2015-07-15 MED ORDER — ASPIRIN 325 MG PO TABS
325.0000 mg | ORAL_TABLET | Freq: Once | ORAL | Status: AC
  Administered 2015-07-15: 325 mg via ORAL
  Filled 2015-07-15: qty 1

## 2015-07-15 MED ORDER — CARVEDILOL 12.5 MG PO TABS
6.2500 mg | ORAL_TABLET | Freq: Once | ORAL | Status: AC
  Administered 2015-07-15: 6.25 mg via ORAL
  Filled 2015-07-15: qty 1

## 2015-07-15 MED ORDER — FUROSEMIDE 20 MG PO TABS
40.0000 mg | ORAL_TABLET | Freq: Once | ORAL | Status: AC
  Administered 2015-07-15: 40 mg via ORAL
  Filled 2015-07-15: qty 2

## 2015-07-15 NOTE — Discharge Instructions (Signed)
It was our pleasure to provide your ER care today - we hope that you feel better.  Elevate legs. Limit salt intake.    Your blood pressure is high today - take your blood pressure medication as prescribed, and follow up with your doctor this week.   Follow up with primary care doctor Monday for recheck.  Return to ER if worse, new symptoms, fevers, difficulty breathing, other concern.     Edema Edema is an abnormal buildup of fluids in your bodytissues. Edema is somewhatdependent on gravity to pull the fluid to the lowest place in your body. That makes the condition more common in the legs and thighs (lower extremities). Painless swelling of the feet and ankles is common and becomes more likely as you get older. It is also common in looser tissues, like around your eyes.  When the affected area is squeezed, the fluid may move out of that spot and leave a dent for a few moments. This dent is called pitting.  CAUSES  There are many possible causes of edema. Eating too much salt and being on your feet or sitting for a long time can cause edema in your legs and ankles. Hot weather may make edema worse. Common medical causes of edema include:  Heart failure.  Liver disease.  Kidney disease.  Weak blood vessels in your legs.  Cancer.  An injury.  Pregnancy.  Some medications.  Obesity. SYMPTOMS  Edema is usually painless.Your skin may look swollen or shiny.  DIAGNOSIS  Your health care provider may be able to diagnose edema by asking about your medical history and doing a physical exam. You may need to have tests such as X-rays, an electrocardiogram, or blood tests to check for medical conditions that may cause edema.  TREATMENT  Edema treatment depends on the cause. If you have heart, liver, or kidney disease, you need the treatment appropriate for these conditions. General treatment may include:  Elevation of the affected body part above the level of your  heart.  Compression of the affected body part. Pressure from elastic bandages or support stockings squeezes the tissues and forces fluid back into the blood vessels. This keeps fluid from entering the tissues.  Restriction of fluid and salt intake.  Use of a water pill (diuretic). These medications are appropriate only for some types of edema. They pull fluid out of your body and make you urinate more often. This gets rid of fluid and reduces swelling, but diuretics can have side effects. Only use diuretics as directed by your health care provider. HOME CARE INSTRUCTIONS   Keep the affected body part above the level of your heart when you are lying down.   Do not sit still or stand for prolonged periods.   Do not put anything directly under your knees when lying down.  Do not wear constricting clothing or garters on your upper legs.   Exercise your legs to work the fluid back into your blood vessels. This may help the swelling go down.   Wear elastic bandages or support stockings to reduce ankle swelling as directed by your health care provider.   Eat a low-salt diet to reduce fluid if your health care provider recommends it.   Only take medicines as directed by your health care provider. SEEK MEDICAL CARE IF:   Your edema is not responding to treatment.  You have heart, liver, or kidney disease and notice symptoms of edema.  You have edema in your legs that does  not improve after elevating them.   You have sudden and unexplained weight gain. SEEK IMMEDIATE MEDICAL CARE IF:   You develop shortness of breath or chest pain.   You cannot breathe when you lie down.  You develop pain, redness, or warmth in the swollen areas.   You have heart, liver, or kidney disease and suddenly get edema.  You have a fever and your symptoms suddenly get worse. MAKE SURE YOU:   Understand these instructions.  Will watch your condition.  Will get help right away if you are not  doing well or get worse.   This information is not intended to replace advice given to you by your health care provider. Make sure you discuss any questions you have with your health care provider.   Document Released: 04/29/2005 Document Revised: 05/20/2014 Document Reviewed: 02/19/2013 Elsevier Interactive Patient Education 2016 [employer].    Hypertension Hypertension, commonly called high blood pressure, is when the force of blood pumping through your arteries is too strong. Your arteries are the blood vessels that carry blood from your heart throughout your body. A blood pressure reading consists of a higher number over a lower number, such as 110/72. The higher number (systolic) is the pressure inside your arteries when your heart pumps. The lower number (diastolic) is the pressure inside your arteries when your heart relaxes. Ideally you want your blood pressure below 120/80. Hypertension forces your heart to work harder to pump blood. Your arteries may become narrow or stiff. Having untreated or uncontrolled hypertension can cause heart attack, stroke, kidney disease, and other problems. RISK FACTORS Some risk factors for high blood pressure are controllable. Others are not.  Risk factors you cannot control include:   Race. You may be at higher risk if you are African American.  Age. Risk increases with age.  Gender. Men are at higher risk than women before age 59 years. After age 96, women are at higher risk than men. Risk factors you can control include:  Not getting enough exercise or physical activity.  Being overweight.  Getting too much fat, sugar, calories, or salt in your diet.  Drinking too much alcohol. SIGNS AND SYMPTOMS Hypertension does not usually cause signs or symptoms. Extremely high blood pressure (hypertensive crisis) may cause headache, anxiety, shortness of breath, and nosebleed. DIAGNOSIS To check if you have hypertension, your health care provider  will measure your blood pressure while you are seated, with your arm held at the level of your heart. It should be measured at least twice using the same arm. Certain conditions can cause a difference in blood pressure between your right and left arms. A blood pressure reading that is higher than normal on one occasion does not mean that you need treatment. If it is not clear whether you have high blood pressure, you may be asked to return on a different day to have your blood pressure checked again. Or, you may be asked to monitor your blood pressure at home for 1 or more weeks. TREATMENT Treating high blood pressure includes making lifestyle changes and possibly taking medicine. Living a healthy lifestyle can help lower high blood pressure. You may need to change some of your habits. Lifestyle changes may include:  Following the DASH diet. This diet is high in fruits, vegetables, and whole grains. It is low in salt, red meat, and added sugars.  Keep your sodium intake below 2,300 mg per day.  Getting at least 30-45 minutes of aerobic exercise at least  4 times per week.  Losing weight if necessary.  Not smoking.  Limiting alcoholic beverages.  Learning ways to reduce stress. Your health care provider may prescribe medicine if lifestyle changes are not enough to get your blood pressure under control, and if one of the following is true:  You are 59-86 years of age and your systolic blood pressure is above 140.  You are 43 years of age or older, and your systolic blood pressure is above 150.  Your diastolic blood pressure is above 90.  You have diabetes, and your systolic blood pressure is over 140 or your diastolic blood pressure is over 90.  You have kidney disease and your blood pressure is above 140/90.  You have heart disease and your blood pressure is above 140/90. Your personal target blood pressure may vary depending on your medical conditions, your age, and other factors. HOME  CARE INSTRUCTIONS  Have your blood pressure rechecked as directed by your health care provider.   Take medicines only as directed by your health care provider. Follow the directions carefully. Blood pressure medicines must be taken as prescribed. The medicine does not work as well when you skip doses. Skipping doses also puts you at risk for problems.  Do not smoke.   Monitor your blood pressure at home as directed by your health care provider. SEEK MEDICAL CARE IF:   You think you are having a reaction to medicines taken.  You have recurrent headaches or feel dizzy.  You have swelling in your ankles.  You have trouble with your vision. SEEK IMMEDIATE MEDICAL CARE IF:  You develop a severe headache or confusion.  You have unusual weakness, numbness, or feel faint.  You have severe chest or abdominal pain.  You vomit repeatedly.  You have trouble breathing. MAKE SURE YOU:   Understand these instructions.  Will watch your condition.  Will get help right away if you are not doing well or get worse.   This information is not intended to replace advice given to you by your health care provider. Make sure you discuss any questions you have with your health care provider.   Document Released: 04/29/2005 Document Revised: 09/13/2014 Document Reviewed: 02/19/2013 Elsevier Interactive Patient Education [employer]
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
## 2015-07-15 NOTE — ED Notes (Signed)
Pt no answer x2 in waiting room

## 2015-07-15 NOTE — ED Notes (Signed)
Pt provided shower by Rocky LinkKen, EMT; given new clothes and socks. Also provided a bus pass

## 2015-07-15 NOTE — ED Notes (Signed)
Pt attempting to use urinal at this time; requesting something to eat and drink

## 2015-07-18 ENCOUNTER — Encounter (HOSPITAL_BASED_OUTPATIENT_CLINIC_OR_DEPARTMENT_OTHER): Payer: Medicaid Other | Attending: Surgery

## 2015-07-18 DIAGNOSIS — I890 Lymphedema, not elsewhere classified: Secondary | ICD-10-CM | POA: Insufficient documentation

## 2015-07-18 DIAGNOSIS — Z7982 Long term (current) use of aspirin: Secondary | ICD-10-CM | POA: Insufficient documentation

## 2015-07-18 DIAGNOSIS — F17200 Nicotine dependence, unspecified, uncomplicated: Secondary | ICD-10-CM | POA: Insufficient documentation

## 2015-07-18 DIAGNOSIS — Z79899 Other long term (current) drug therapy: Secondary | ICD-10-CM | POA: Insufficient documentation

## 2015-07-18 DIAGNOSIS — I10 Essential (primary) hypertension: Secondary | ICD-10-CM | POA: Diagnosis not present

## 2015-07-18 DIAGNOSIS — Z590 Homelessness: Secondary | ICD-10-CM | POA: Insufficient documentation

## 2015-08-01 ENCOUNTER — Emergency Department (HOSPITAL_COMMUNITY): Payer: Medicaid Other

## 2015-08-01 ENCOUNTER — Emergency Department (HOSPITAL_COMMUNITY)
Admission: EM | Admit: 2015-08-01 | Discharge: 2015-08-01 | Disposition: A | Discharge status: Home / Self Care | Payer: Medicaid Other | Attending: Emergency Medicine | Admitting: Emergency Medicine

## 2015-08-01 ENCOUNTER — Encounter (HOSPITAL_COMMUNITY): Payer: Self-pay | Admitting: Emergency Medicine

## 2015-08-01 DIAGNOSIS — I10 Essential (primary) hypertension: Secondary | ICD-10-CM | POA: Insufficient documentation

## 2015-08-01 DIAGNOSIS — F17210 Nicotine dependence, cigarettes, uncomplicated: Secondary | ICD-10-CM | POA: Insufficient documentation

## 2015-08-01 DIAGNOSIS — Z8669 Personal history of other diseases of the nervous system and sense organs: Secondary | ICD-10-CM | POA: Diagnosis not present

## 2015-08-01 DIAGNOSIS — W01198A Fall on same level from slipping, tripping and stumbling with subsequent striking against other object, initial encounter: Secondary | ICD-10-CM | POA: Diagnosis not present

## 2015-08-01 DIAGNOSIS — Z7982 Long term (current) use of aspirin: Secondary | ICD-10-CM | POA: Diagnosis not present

## 2015-08-01 DIAGNOSIS — R600 Localized edema: Secondary | ICD-10-CM | POA: Diagnosis not present

## 2015-08-01 DIAGNOSIS — Z590 Homelessness: Secondary | ICD-10-CM | POA: Diagnosis not present

## 2015-08-01 DIAGNOSIS — S0990XA Unspecified injury of head, initial encounter: Secondary | ICD-10-CM | POA: Diagnosis present

## 2015-08-01 DIAGNOSIS — Y998 Other external cause status: Secondary | ICD-10-CM | POA: Insufficient documentation

## 2015-08-01 DIAGNOSIS — Y9289 Other specified places as the place of occurrence of the external cause: Secondary | ICD-10-CM | POA: Insufficient documentation

## 2015-08-01 DIAGNOSIS — Y9301 Activity, walking, marching and hiking: Secondary | ICD-10-CM | POA: Diagnosis not present

## 2015-08-01 DIAGNOSIS — S0012XA Contusion of left eyelid and periocular area, initial encounter: Secondary | ICD-10-CM | POA: Insufficient documentation

## 2015-08-01 DIAGNOSIS — Z79899 Other long term (current) drug therapy: Secondary | ICD-10-CM | POA: Insufficient documentation

## 2015-08-01 DIAGNOSIS — W19XXXA Unspecified fall, initial encounter: Secondary | ICD-10-CM

## 2015-08-01 LAB — COMPREHENSIVE METABOLIC PANEL
ALT: 21 U/L (ref 17–63)
ANION GAP: 11 (ref 5–15)
AST: 18 U/L (ref 15–41)
Albumin: 3.8 g/dL (ref 3.5–5.0)
Alkaline Phosphatase: 107 U/L (ref 38–126)
BUN: 7 mg/dL (ref 6–20)
CHLORIDE: 102 mmol/L (ref 101–111)
CO2: 29 mmol/L (ref 22–32)
Calcium: 9.1 mg/dL (ref 8.9–10.3)
Creatinine, Ser: 0.76 mg/dL (ref 0.61–1.24)
Glucose, Bld: 83 mg/dL (ref 65–99)
POTASSIUM: 4.1 mmol/L (ref 3.5–5.1)
SODIUM: 142 mmol/L (ref 135–145)
Total Bilirubin: 0.4 mg/dL (ref 0.3–1.2)
Total Protein: 7.6 g/dL (ref 6.5–8.1)

## 2015-08-01 LAB — CBC WITH DIFFERENTIAL/PLATELET
Basophils Absolute: 0 10*3/uL (ref 0.0–0.1)
Basophils Relative: 0 %
EOS ABS: 0 10*3/uL (ref 0.0–0.7)
EOS PCT: 1 %
HCT: 41.7 % (ref 39.0–52.0)
Hemoglobin: 13.4 g/dL (ref 13.0–17.0)
LYMPHS ABS: 1.2 10*3/uL (ref 0.7–4.0)
Lymphocytes Relative: 15 %
MCH: 28 pg (ref 26.0–34.0)
MCHC: 32.1 g/dL (ref 30.0–36.0)
MCV: 87.1 fL (ref 78.0–100.0)
MONO ABS: 0.7 10*3/uL (ref 0.1–1.0)
MONOS PCT: 8 %
Neutro Abs: 6.3 10*3/uL (ref 1.7–7.7)
Neutrophils Relative %: 76 %
PLATELETS: 270 10*3/uL (ref 150–400)
RBC: 4.79 MIL/uL (ref 4.22–5.81)
RDW: 15.6 % — AB (ref 11.5–15.5)
WBC: 8.3 10*3/uL (ref 4.0–10.5)

## 2015-08-01 MED ORDER — FUROSEMIDE 40 MG PO TABS: 40.0000 mg | ORAL_TABLET | Freq: Two times a day (BID) | ORAL | Status: DC

## 2015-08-01 MED ORDER — FUROSEMIDE 10 MG/ML IJ SOLN
60.0000 mg | Freq: Once | INTRAMUSCULAR | Status: AC
Start: 2015-08-01 — End: 2015-08-01
  Administered 2015-08-01: 60 mg via INTRAMUSCULAR
  Filled 2015-08-01: qty 6

## 2015-08-01 NOTE — Discharge Instructions (Signed)
You have refused treatment today. Continue Lasix. Please follow-up with appointment as scheduled. Return if any worsening symptoms   Peripheral Edema You have swelling in your legs (peripheral edema). This swelling is due to excess accumulation of salt and water in your body. Edema may be a sign of heart, kidney or liver disease, or a side effect of a medication. It may also be due to problems in the leg veins. Elevating your legs and using special support stockings may be very helpful, if the cause of the swelling is due to poor venous circulation. Avoid long periods of standing, whatever the cause. Treatment of edema depends on identifying the cause. Chips, pretzels, pickles and other salty foods should be avoided. Restricting salt in your diet is almost always needed. Water pills (diuretics) are often used to remove the excess salt and water from your body via urine. These medicines prevent the kidney from reabsorbing sodium. This increases urine flow. Diuretic treatment may also result in lowering of potassium levels in your body. Potassium supplements may be needed if you have to use diuretics daily. Daily weights can help you keep track of your progress in clearing your edema. You should call your caregiver for follow up care as recommended. SEEK IMMEDIATE MEDICAL CARE IF:   You have increased swelling, pain, redness, or heat in your legs.  You develop shortness of breath, especially when lying down.  You develop chest or abdominal pain, weakness, or fainting.  You have a fever.   This information is not intended to replace advice given to you by your health care provider. Make sure you discuss any questions you have with your health care provider.   Document Released: 06/06/2004 Document Revised: 07/22/2011 Document Reviewed: 11/09/2014 Elsevier Interactive Patient Education [employer]

## 2015-08-01 NOTE — ED Notes (Signed)
Pt refused DC vitals 
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
## 2015-08-01 NOTE — ED Notes (Signed)
Pt refused vitals 

## 2015-08-01 NOTE — ED Notes (Signed)
EMS reports pt from homeless shelter with compliant of frequent falls and bilateral leg swelling. Pt states today while walking with his walker he tripped and fell. Pt denies LOC or hitting his head. Pt is alert and ox4. C/o left leg pain.

## 2015-08-03 ENCOUNTER — Telehealth: Payer: Self-pay | Admitting: Clinical

## 2015-08-03 NOTE — Telephone Encounter (Signed)
[name], [facility], inquiring whether or not he should schedule an appointment for [name] to see PCP to obtain a script for new walker, as pt current rolling walker has broken bars. Last ED visit says that case manager [name] is making an effort to obtain new walker for pt. [name] is encouraged to ask [name] to see his last discharge papers, and to have [name] make an appointment to see PCP, as he is due for an appointment, and needs to be seen by PCP to obtain script for new rolling walker.

## 2015-08-11 ENCOUNTER — Encounter (HOSPITAL_COMMUNITY): Payer: Self-pay | Admitting: Emergency Medicine

## 2015-08-11 ENCOUNTER — Emergency Department (HOSPITAL_COMMUNITY)
Admission: EM | Admit: 2015-08-11 | Discharge: 2015-08-11 | Disposition: A | Discharge status: Home / Self Care | Payer: Medicaid Other | Attending: Physician Assistant | Admitting: Physician Assistant

## 2015-08-11 DIAGNOSIS — R600 Localized edema: Secondary | ICD-10-CM | POA: Insufficient documentation

## 2015-08-11 DIAGNOSIS — R2689 Other abnormalities of gait and mobility: Secondary | ICD-10-CM | POA: Diagnosis not present

## 2015-08-11 DIAGNOSIS — Y9389 Activity, other specified: Secondary | ICD-10-CM | POA: Diagnosis not present

## 2015-08-11 DIAGNOSIS — Z7982 Long term (current) use of aspirin: Secondary | ICD-10-CM | POA: Diagnosis not present

## 2015-08-11 DIAGNOSIS — Y998 Other external cause status: Secondary | ICD-10-CM | POA: Insufficient documentation

## 2015-08-11 DIAGNOSIS — Z590 Homelessness: Secondary | ICD-10-CM | POA: Insufficient documentation

## 2015-08-11 DIAGNOSIS — Z87828 Personal history of other (healed) physical injury and trauma: Secondary | ICD-10-CM | POA: Diagnosis not present

## 2015-08-11 DIAGNOSIS — W19XXXA Unspecified fall, initial encounter: Secondary | ICD-10-CM

## 2015-08-11 DIAGNOSIS — F17210 Nicotine dependence, cigarettes, uncomplicated: Secondary | ICD-10-CM | POA: Insufficient documentation

## 2015-08-11 DIAGNOSIS — Y9289 Other specified places as the place of occurrence of the external cause: Secondary | ICD-10-CM | POA: Diagnosis not present

## 2015-08-11 DIAGNOSIS — Z043 Encounter for examination and observation following other accident: Secondary | ICD-10-CM | POA: Diagnosis present

## 2015-08-11 DIAGNOSIS — Z8669 Personal history of other diseases of the nervous system and sense organs: Secondary | ICD-10-CM | POA: Diagnosis not present

## 2015-08-11 DIAGNOSIS — Z79899 Other long term (current) drug therapy: Secondary | ICD-10-CM | POA: Insufficient documentation

## 2015-08-11 DIAGNOSIS — I10 Essential (primary) hypertension: Secondary | ICD-10-CM | POA: Diagnosis not present

## 2015-08-11 DIAGNOSIS — W1839XA Other fall on same level, initial encounter: Secondary | ICD-10-CM | POA: Diagnosis not present

## 2015-08-11 NOTE — ED Notes (Signed)
Patient cleaned up and assisted into paper scrubs. Pt asked this RN to inquire about a new walker, as his current one is unsteady. Burna MortimerWanda - [name] had seen him recently and has given him 2 new walkers in the past, the last one being last week. Patient states [facility] and he does not have another. MD made aware.

## 2015-08-11 NOTE — Discharge Instructions (Signed)
Please return with any concerns. °
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
## 2015-08-11 NOTE — ED Notes (Signed)
PER [facility]: Patient to ED from homeless shelter following witnessed fall. Per EMS, patient has no complaints other than he could not pick himself up off the floor and had a BM on the floor and couldn't clean himself up. Pt denies pain. A&O x 4. No other signs of injury.

## 2015-08-11 NOTE — ED Notes (Signed)
Patient states he has edema in bilateral lower legs and it is hard to get around, so he uses a walker - walker is "unsteady and broken," and he fell today. After falling, pt had BM on floor and states he was unable to clean himself up so he called 911.

## 2015-08-11 NOTE — ED Notes (Signed)
Provided patient with bus pass and graham crackers. Assisted to ED entrance in wheelchair.

## 2015-08-15 ENCOUNTER — Encounter (HOSPITAL_COMMUNITY): Payer: Self-pay | Admitting: Emergency Medicine

## 2015-08-15 ENCOUNTER — Telehealth: Payer: Self-pay

## 2015-08-15 ENCOUNTER — Emergency Department (HOSPITAL_COMMUNITY)
Admission: EM | Admit: 2015-08-15 | Discharge: 2015-08-15 | Disposition: A | Discharge status: Home / Self Care | Payer: Medicaid Other | Attending: Emergency Medicine | Admitting: Emergency Medicine

## 2015-08-15 DIAGNOSIS — Y998 Other external cause status: Secondary | ICD-10-CM | POA: Diagnosis not present

## 2015-08-15 DIAGNOSIS — Z7982 Long term (current) use of aspirin: Secondary | ICD-10-CM | POA: Insufficient documentation

## 2015-08-15 DIAGNOSIS — Z590 Homelessness: Secondary | ICD-10-CM | POA: Diagnosis not present

## 2015-08-15 DIAGNOSIS — W010XXA Fall on same level from slipping, tripping and stumbling without subsequent striking against object, initial encounter: Secondary | ICD-10-CM | POA: Insufficient documentation

## 2015-08-15 DIAGNOSIS — Y9389 Activity, other specified: Secondary | ICD-10-CM | POA: Insufficient documentation

## 2015-08-15 DIAGNOSIS — Z043 Encounter for examination and observation following other accident: Secondary | ICD-10-CM | POA: Insufficient documentation

## 2015-08-15 DIAGNOSIS — Z8669 Personal history of other diseases of the nervous system and sense organs: Secondary | ICD-10-CM | POA: Diagnosis not present

## 2015-08-15 DIAGNOSIS — Y92481 Parking lot as the place of occurrence of the external cause: Secondary | ICD-10-CM | POA: Diagnosis not present

## 2015-08-15 DIAGNOSIS — F17210 Nicotine dependence, cigarettes, uncomplicated: Secondary | ICD-10-CM | POA: Diagnosis not present

## 2015-08-15 DIAGNOSIS — R600 Localized edema: Secondary | ICD-10-CM | POA: Insufficient documentation

## 2015-08-15 DIAGNOSIS — Z79899 Other long term (current) drug therapy: Secondary | ICD-10-CM | POA: Insufficient documentation

## 2015-08-15 DIAGNOSIS — I10 Essential (primary) hypertension: Secondary | ICD-10-CM | POA: Insufficient documentation

## 2015-08-15 DIAGNOSIS — W19XXXA Unspecified fall, initial encounter: Secondary | ICD-10-CM

## 2015-08-15 NOTE — ED Notes (Signed)
Pt to be discharged to home with his new walker. Pt agrees to travel via a bus. Bus pass given to pt. Transported to bus stop by security vehicle.Pt remains cooperative and alert.

## 2015-08-15 NOTE — Telephone Encounter (Signed)
Message received from Marval RegalKim Gibbs, RN CM noting that the patient may need transportation to his appointment on 08/21/15 @ 0945. This CM returned the call and inquired if the patient has applied for medicaid transportation. She noted that the [facility] has addressed this transportation service with the patient.   Notified Idelle LeechChadelle Brown, [facility] scheduler that the patient may need to have a cab arranged for his clinic appointment on 08/21/15.

## 2015-08-15 NOTE — Progress Notes (Signed)
Spoke with ED NP/PA working with pt  Pt has broken the last RW provided to him Pt wanting to be assisted in getting another  1513 Cm spoke with [place]cretia of [facility] to see if she could verify if pt received last [facility]W from [facility] and to see if he is eligible for charity RW  Cm called [employer] x 4 in [place] Cm located RWs at the [address] , [phone] (1) & the [facility] ([address]) [phone] [employer]  Provided pt with this information

## 2015-08-15 NOTE — ED Notes (Addendum)
Per EMS, patient wanted to homeless shelter to call out for heat exposure. Patient states he fell and urinated on himself. Per homeless shelter, patient eased himself to the ground. Patient is not complaining of any pain at this time. Per homeless shelter, patient was not getting what he wanted at homeless shelter so he wanted EMS to be called.

## 2015-08-15 NOTE — ED Notes (Signed)
Bed: WA02 Expected date:  Expected time:  Means of arrival:  Comments: EMS-fall 

## 2015-08-15 NOTE — ED Notes (Signed)
Social worker called to assist with transportation

## 2015-08-15 NOTE — ED Notes (Signed)
Pt to be discharged after social worker assists with transportation. Pt advised of plan.

## 2015-08-15 NOTE — Progress Notes (Signed)
Pt informed that [facility] was contacted about possible charity for RW Pt reports getting last rollator [facility]" Pt believes because of his weight he broke one side Pt states he has medicaid but "no money" to purchase a RW from [employer] Recommend [facility] Discussed [facility] unable to provide assist with monies  Pt made aware that [facility] Cm responded and wants to encourage him to [facility] Pt states [facility] is trying to assist him to get to appt Cm notified [facility] CM pt may need assist to get to appt

## 2015-08-15 NOTE — Discharge Instructions (Signed)

## 2015-08-15 NOTE — Progress Notes (Signed)
[name] - discussed she will help with trying to get pt [facility] +  pt can also use medicaid transportation CM reviewed Medicaid transportation services with pt and provided the contact number to call CM encouraged pt to call today to set up a ride for his Monday [facility]/15 [facility]  Pt appreciative of resource provided Placed written resource information in his bag
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
## 2015-08-15 NOTE — Progress Notes (Signed)
[facility] Admission x 1  [facility] and has an upcoming [facility]   This pt has been [facility] by [facility] ED CMs and SW  Pt is chronically homeless Pt is familiar and has used services of [facility] Chief of Staff(interactive resource center) [place] and has been followed by [facility]avinia [facility]Mary ann [facility] ED CM Burna MortimerWanda assisted pt on 06/23/15 -pcp, wound clinic, [facility]  [facility] ED SW Jody Assisted pt with shelter resources 07/06/15   Ref[facility] CM making her aware [facility]  [facility]    Entered in d/c instructions Amao, Enobong Go on 08/21/2015 You have an appointment on Monday 08/21/15 at 0945 am to see Dr Venetia NightAmao [facility] Please go to your appointment [address] [place] [id number] [phone]

## 2015-08-15 NOTE — Progress Notes (Signed)
Sp[facility] to Le[facility]ritia [facility] who reports she will bring standard rolling walker to patient's room.

## 2015-08-19 ENCOUNTER — Emergency Department (HOSPITAL_COMMUNITY)
Admission: EM | Admit: 2015-08-19 | Discharge: 2015-08-20 | Disposition: A | Discharge status: Home / Self Care | Payer: Medicaid Other | Attending: Emergency Medicine | Admitting: Emergency Medicine

## 2015-08-19 ENCOUNTER — Encounter (HOSPITAL_COMMUNITY): Payer: Self-pay | Admitting: Emergency Medicine

## 2015-08-19 DIAGNOSIS — F17210 Nicotine dependence, cigarettes, uncomplicated: Secondary | ICD-10-CM | POA: Insufficient documentation

## 2015-08-19 DIAGNOSIS — Z79899 Other long term (current) drug therapy: Secondary | ICD-10-CM | POA: Insufficient documentation

## 2015-08-19 DIAGNOSIS — Y998 Other external cause status: Secondary | ICD-10-CM | POA: Insufficient documentation

## 2015-08-19 DIAGNOSIS — Z87828 Personal history of other (healed) physical injury and trauma: Secondary | ICD-10-CM | POA: Diagnosis not present

## 2015-08-19 DIAGNOSIS — Y9389 Activity, other specified: Secondary | ICD-10-CM | POA: Insufficient documentation

## 2015-08-19 DIAGNOSIS — Z043 Encounter for examination and observation following other accident: Secondary | ICD-10-CM | POA: Diagnosis present

## 2015-08-19 DIAGNOSIS — I890 Lymphedema, not elsewhere classified: Secondary | ICD-10-CM | POA: Diagnosis not present

## 2015-08-19 DIAGNOSIS — Z8669 Personal history of other diseases of the nervous system and sense organs: Secondary | ICD-10-CM | POA: Diagnosis not present

## 2015-08-19 DIAGNOSIS — Z7982 Long term (current) use of aspirin: Secondary | ICD-10-CM | POA: Insufficient documentation

## 2015-08-19 DIAGNOSIS — W010XXA Fall on same level from slipping, tripping and stumbling without subsequent striking against object, initial encounter: Secondary | ICD-10-CM | POA: Diagnosis not present

## 2015-08-19 DIAGNOSIS — Z590 Homelessness: Secondary | ICD-10-CM | POA: Diagnosis not present

## 2015-08-19 DIAGNOSIS — Y9289 Other specified places as the place of occurrence of the external cause: Secondary | ICD-10-CM | POA: Insufficient documentation

## 2015-08-19 DIAGNOSIS — I10 Essential (primary) hypertension: Secondary | ICD-10-CM | POA: Insufficient documentation

## 2015-08-19 DIAGNOSIS — W19XXXA Unspecified fall, initial encounter: Secondary | ICD-10-CM

## 2015-08-19 LAB — I-STAT CHEM 8, ED
BUN: 7 mg/dL (ref 6–20)
CALCIUM ION: 1.12 mmol/L (ref 1.12–1.23)
Chloride: 102 mmol/L (ref 101–111)
Creatinine, Ser: 0.8 mg/dL (ref 0.61–1.24)
GLUCOSE: 85 mg/dL (ref 65–99)
HCT: 39 % (ref 39.0–52.0)
HEMOGLOBIN: 13.3 g/dL (ref 13.0–17.0)
Potassium: 4 mmol/L (ref 3.5–5.1)
SODIUM: 142 mmol/L (ref 135–145)
TCO2: 26 mmol/L (ref 0–100)

## 2015-08-19 NOTE — Discharge Instructions (Signed)
Follow-up with your doctor Monday for recheck and possibly getting a wheelchair
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
## 2015-08-19 NOTE — ED Notes (Signed)
Per [facility] patient fell from a standing position.  Was helped into a chair by bystanders.  Patient states he came to the ED because he defecated and needs help getting cleaned up.  Patient is alert and oriented and states he is in no pain.

## 2015-08-20 ENCOUNTER — Emergency Department (HOSPITAL_COMMUNITY)
Admission: EM | Admit: 2015-08-20 | Discharge: 2015-08-20 | Disposition: A | Discharge status: Home / Self Care | Payer: No Typology Code available for payment source | Attending: Emergency Medicine | Admitting: Emergency Medicine

## 2015-08-20 ENCOUNTER — Encounter (HOSPITAL_COMMUNITY): Payer: Self-pay

## 2015-08-20 DIAGNOSIS — I10 Essential (primary) hypertension: Secondary | ICD-10-CM | POA: Insufficient documentation

## 2015-08-20 DIAGNOSIS — Z79899 Other long term (current) drug therapy: Secondary | ICD-10-CM | POA: Insufficient documentation

## 2015-08-20 DIAGNOSIS — Z7982 Long term (current) use of aspirin: Secondary | ICD-10-CM | POA: Insufficient documentation

## 2015-08-20 DIAGNOSIS — R600 Localized edema: Secondary | ICD-10-CM | POA: Insufficient documentation

## 2015-08-20 DIAGNOSIS — Z87828 Personal history of other (healed) physical injury and trauma: Secondary | ICD-10-CM | POA: Insufficient documentation

## 2015-08-20 DIAGNOSIS — Z5900 Homelessness unspecified: Secondary | ICD-10-CM

## 2015-08-20 DIAGNOSIS — Z8669 Personal history of other diseases of the nervous system and sense organs: Secondary | ICD-10-CM | POA: Insufficient documentation

## 2015-08-20 DIAGNOSIS — F17210 Nicotine dependence, cigarettes, uncomplicated: Secondary | ICD-10-CM | POA: Insufficient documentation

## 2015-08-20 NOTE — ED Notes (Signed)
[facility] Expected date:  Expected time:  Means of arrival:  Comments: EMS- defecated themself
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
## 2015-08-20 NOTE — ED Notes (Signed)
PT RECEIVED VIA EMS REQUESTING TO BE CLEANED UP AFTER HE DEFECATED ON HIMSELF. PT STATES THE SHELTER HE USES IS CLOSED ON THE WEEKENDS, AND HE COULD NOT GET TO A BATHROOM. PT HAS NO OTHER COMPLAINTS.
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
## 2015-08-20 NOTE — ED Notes (Signed)
PT DISCHARGED. INSTRUCTIONS GIVEN. AAOX3. PT IN NO APPARENT DISTRESS OR PAIN. THE OPPORTUNITY TO ASK QUESTIONS WAS PROVIDED. 

## 2015-08-20 NOTE — ED Notes (Signed)
PT ESCORTED TO THE BATHROOM, WASH CLOTHES AND CLEAN BLUE SCRUBS GIVEN TO PT TO CLEAN HIMSELF UP AND CHANGE HIS CLOTHES.

## 2015-08-20 NOTE — ED Notes (Signed)
MD at bedside. 

## 2015-08-20 NOTE — Discharge Instructions (Signed)
Followup with your doctor as needed

## 2015-08-21 ENCOUNTER — Encounter (HOSPITAL_COMMUNITY): Payer: Self-pay | Admitting: Emergency Medicine

## 2015-08-21 ENCOUNTER — Emergency Department (HOSPITAL_COMMUNITY)
Admission: EM | Admit: 2015-08-21 | Discharge: 2015-08-21 | Disposition: A | Discharge status: Home / Self Care | Payer: Medicaid Other | Attending: Emergency Medicine | Admitting: Emergency Medicine

## 2015-08-21 ENCOUNTER — Ambulatory Visit: Payer: Medicaid Other | Attending: Family Medicine | Admitting: Family Medicine

## 2015-08-21 ENCOUNTER — Encounter: Payer: Self-pay | Admitting: Family Medicine

## 2015-08-21 ENCOUNTER — Ambulatory Visit (HOSPITAL_BASED_OUTPATIENT_CLINIC_OR_DEPARTMENT_OTHER): Payer: Medicaid Other | Admitting: Clinical

## 2015-08-21 VITALS — BP 146/92 | HR 74 | Temp 97.6°F | Resp 16 | Ht 69.0 in | Wt 289.2 lb

## 2015-08-21 DIAGNOSIS — Z9114 Patient's other noncompliance with medication regimen: Secondary | ICD-10-CM | POA: Insufficient documentation

## 2015-08-21 DIAGNOSIS — R531 Weakness: Secondary | ICD-10-CM | POA: Insufficient documentation

## 2015-08-21 DIAGNOSIS — Z760 Encounter for issue of repeat prescription: Secondary | ICD-10-CM | POA: Diagnosis present

## 2015-08-21 DIAGNOSIS — Z5900 Homelessness unspecified: Secondary | ICD-10-CM

## 2015-08-21 DIAGNOSIS — M7501 Adhesive capsulitis of right shoulder: Secondary | ICD-10-CM | POA: Diagnosis not present

## 2015-08-21 DIAGNOSIS — I10 Essential (primary) hypertension: Secondary | ICD-10-CM | POA: Insufficient documentation

## 2015-08-21 DIAGNOSIS — F17210 Nicotine dependence, cigarettes, uncomplicated: Secondary | ICD-10-CM | POA: Insufficient documentation

## 2015-08-21 DIAGNOSIS — I890 Lymphedema, not elsewhere classified: Secondary | ICD-10-CM | POA: Diagnosis not present

## 2015-08-21 DIAGNOSIS — Z79899 Other long term (current) drug therapy: Secondary | ICD-10-CM | POA: Insufficient documentation

## 2015-08-21 DIAGNOSIS — Z91148 Patient's other noncompliance with medication regimen for other reason: Secondary | ICD-10-CM

## 2015-08-21 DIAGNOSIS — Z7982 Long term (current) use of aspirin: Secondary | ICD-10-CM | POA: Diagnosis not present

## 2015-08-21 HISTORY — DX: Patient's other noncompliance with medication regimen: Z91.14

## 2015-08-21 HISTORY — DX: Patient's other noncompliance with medication regimen for other reason: Z91.148

## 2015-08-21 MED ORDER — CARVEDILOL 6.25 MG PO TABS: 6.2500 mg | ORAL_TABLET | Freq: Two times a day (BID) | ORAL | Status: DC

## 2015-08-21 MED ORDER — FUROSEMIDE 40 MG PO TABS: 40.0000 mg | ORAL_TABLET | Freq: Two times a day (BID) | ORAL | Status: DC

## 2015-08-21 MED ORDER — CLONIDINE HCL 0.1 MG PO TABS
0.1000 mg | ORAL_TABLET | Freq: Once | ORAL | Status: AC
  Administered 2015-08-21: 0.1 mg via ORAL

## 2015-08-21 MED ORDER — ASPIRIN 325 MG PO TABS: 325.0000 mg | ORAL_TABLET | Freq: Every day | ORAL | Status: DC

## 2015-08-21 MED FILL — CARVEDILOL 6.25 MG TABLET: 6.25 | 30 days supply | Qty: 60 | Fill #0

## 2015-08-21 MED FILL — FUROSEMIDE 40 MG TABLET: 40 | 30 days supply | Qty: 60 | Fill #0

## 2015-08-21 NOTE — Progress Notes (Signed)
ASSESSMENT: Pt experiencing Homelessness as a single adult; pt needs to f/u with his PCP; may benefit from healthcare navigation and supportive counseling regarding homelessness.  Stage of Change: precontemplative  PLAN: 1. F/U with behavioral health consultant in as needed 2. Psychiatric Medications: none. 3. Behavioral recommendation(s):   -Continue with disability process -Consider becoming more proactive in advocating for self -Contact [employer] by Friday, if continue to hear nothing back from [employer] concerning medical release -Take script to DME supply store to pick up walker with seat(Terrence pick up) SUBJECTIVE: Pt. referred by Dr Venetia NightAmao for psychosocial :  Pt. reports the following symptoms/concerns: Pt states that his primary concern today is obtaining a walker with a seat; pt expresses no other concerns today. Pt advocate, [facility]ipa [facility], says that pt will find out from disability lawyers on Thursday, when they will "work on getting in front of a judge"; pt has not heard whether or not medical release has gone through concerning pt records from previous facility. Pt continues to experience homelessness.  Duration of problem: unknown duration, homeless over two years Severity: severe  OBJECTIVE: Orientation & Cognition: Oriented x3. Thought processes normal and appropriate to situation. Mood: appropriate. Affect: appropriate Appearance: appropriate Risk of harm to self or others: no known risk of harm to self or others Substance use: tobacco Assessments administered: PHQ2: 0  Diagnosis: Homeless single adult CPT Code: Z59.0 -------------------------------------------- Other(s) present in the room: [facility]  Time spent with patient in exam room: 15 minutes, 10:30-10:45am   [facility] 08/21/2015 04/24/2015  Decreased Interest 0 3  Down, Depressed, Hopeless 0 3  PHQ - 2 Score 0 6  Altered sleeping - 3  Tired, decreased energy - 3  Change in  appetite - 0  Feeling bad or failure about yourself  - 3  Trouble concentrating - 3  Moving slowly or fidgety/restless - 0  Suicidal thoughts - 0  PHQ-9 Score - 18    GAD 7 : Generalized Anxiety Score 04/24/2015  Nervous, Anxious, on Edge 0  Control/stop worrying 0  Worry too much - different things 0  Trouble relaxing 0  Restless 0  Easily annoyed or irritable 0  Afraid - awful might happen 0  Total GAD 7 Score 0

## 2015-08-21 NOTE — ED Notes (Signed)
RN called PT  In lobby with no response.

## 2015-08-21 NOTE — ED Notes (Addendum)
EMS states he called them because "he wanted to get out of the sun." EMS states he can't walk well. Was discharged from here this morning.  Pt states "I was in direct sunlight, and it made me loose all muscle control. I couldn't get up." Denies any pain, N/V/D, fever/chills.  EMS stated he [facility] Said he told them "no body will mess with it." Patient[facility]

## 2015-08-21 NOTE — ED Notes (Signed)
Called without response from lobby  
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
## 2015-08-21 NOTE — ED Notes (Addendum)
Because labs were run late yesterday on patient, will wait to let MD place appropriate orders as necessary on patient after being medically evaluated. Pt appears to be in no obvious distress at this time. Just states he feels weak

## 2015-08-21 NOTE — Patient Instructions (Signed)
Hypertension Hypertension, commonly called high blood pressure, is when the force of blood pumping through your arteries is too strong. Your arteries are the blood vessels that carry blood from your heart throughout your body. A blood pressure reading consists of a higher number over a lower number, such as 110/72. The higher number (systolic) is the pressure inside your arteries when your heart pumps. The lower number (diastolic) is the pressure inside your arteries when your heart relaxes. Ideally you want your blood pressure below 120/80. Hypertension forces your heart to work harder to pump blood. Your arteries may become narrow or stiff. Having untreated or uncontrolled hypertension can cause heart attack, stroke, kidney disease, and other problems. RISK FACTORS Some risk factors for high blood pressure are controllable. Others are not.  Risk factors you cannot control include:   Race. You may be at higher risk if you are African American.  Age. Risk increases with age.  Gender. Men are at higher risk than women before age 45 years. After age 65, women are at higher risk than men. Risk factors you can control include:  Not getting enough exercise or physical activity.  Being overweight.  Getting too much fat, sugar, calories, or salt in your diet.  Drinking too much alcohol. SIGNS AND SYMPTOMS Hypertension does not usually cause signs or symptoms. Extremely high blood pressure (hypertensive crisis) may cause headache, anxiety, shortness of breath, and nosebleed. DIAGNOSIS To check if you have hypertension, your health care provider will measure your blood pressure while you are seated, with your arm held at the level of your heart. It should be measured at least twice using the same arm. Certain conditions can cause a difference in blood pressure between your right and left arms. A blood pressure reading that is higher than normal on one occasion does not mean that you need treatment. If  it is not clear whether you have high blood pressure, you may be asked to return on a different day to have your blood pressure checked again. Or, you may be asked to monitor your blood pressure at home for 1 or more weeks. TREATMENT Treating high blood pressure includes making lifestyle changes and possibly taking medicine. Living a healthy lifestyle can help lower high blood pressure. You may need to change some of your habits. Lifestyle changes may include:  Following the DASH diet. This diet is high in fruits, vegetables, and whole grains. It is low in salt, red meat, and added sugars.  Keep your sodium intake below 2,300 mg per day.  Getting at least 30-45 minutes of aerobic exercise at least 4 times per week.  Losing weight if necessary.  Not smoking.  Limiting alcoholic beverages.  Learning ways to reduce stress. Your health care provider may prescribe medicine if lifestyle changes are not enough to get your blood pressure under control, and if one of the following is true:  You are 18-59 years of age and your systolic blood pressure is above 140.  You are 60 years of age or older, and your systolic blood pressure is above 150.  Your diastolic blood pressure is above 90.  You have diabetes, and your systolic blood pressure is over 140 or your diastolic blood pressure is over 90.  You have kidney disease and your blood pressure is above 140/90.  You have heart disease and your blood pressure is above 140/90. Your personal target blood pressure may vary depending on your medical conditions, your age, and other factors. HOME CARE INSTRUCTIONS    Have your blood pressure rechecked as directed by your health care provider.   Take medicines only as directed by your health care provider. Follow the directions carefully. Blood pressure medicines must be taken as prescribed. The medicine does not work as well when you skip doses. Skipping doses also puts you at risk for  problems.  Do not smoke.   Monitor your blood pressure at home as directed by your health care provider. SEEK MEDICAL CARE IF:   You think you are having a reaction to medicines taken.  You have recurrent headaches or feel dizzy.  You have swelling in your ankles.  You have trouble with your vision. SEEK IMMEDIATE MEDICAL CARE IF:  You develop a severe headache or confusion.  You have unusual weakness, numbness, or feel faint.  You have severe chest or abdominal pain.  You vomit repeatedly.  You have trouble breathing. MAKE SURE YOU:   Understand these instructions.  Will watch your condition.  Will get help right away if you are not doing well or get worse.   This information is not intended to replace advice given to you by your health care provider. Make sure you discuss any questions you have with your health care provider.   Document Released: 04/29/2005 Document Revised: 09/13/2014 Document Reviewed: 02/19/2013 Elsevier Interactive Patient Education 2016 Elsevier Inc.  

## 2015-08-21 NOTE — Progress Notes (Signed)
Patient's here for script for walker or scotter.  Patient requesting refill of meds.  [facility]  Per Dr. Venetia NightAmao patient will be given 0.1mg  of clonidine.

## 2015-08-21 NOTE — ED Notes (Signed)
Called for third time without response from lobby 

## 2015-08-23 ENCOUNTER — Encounter (HOSPITAL_COMMUNITY): Payer: Self-pay

## 2015-08-23 ENCOUNTER — Emergency Department (HOSPITAL_COMMUNITY)
Admission: EM | Admit: 2015-08-23 | Discharge: 2015-08-23 | Disposition: A | Discharge status: Home / Self Care | Payer: Medicaid Other | Attending: Emergency Medicine | Admitting: Emergency Medicine

## 2015-08-23 ENCOUNTER — Telehealth: Payer: Self-pay | Admitting: Clinical

## 2015-08-23 DIAGNOSIS — R150 Incomplete defecation: Secondary | ICD-10-CM | POA: Diagnosis present

## 2015-08-23 DIAGNOSIS — Z7982 Long term (current) use of aspirin: Secondary | ICD-10-CM | POA: Diagnosis not present

## 2015-08-23 DIAGNOSIS — I890 Lymphedema, not elsewhere classified: Secondary | ICD-10-CM | POA: Diagnosis not present

## 2015-08-23 DIAGNOSIS — Z79899 Other long term (current) drug therapy: Secondary | ICD-10-CM | POA: Diagnosis not present

## 2015-08-23 DIAGNOSIS — I10 Essential (primary) hypertension: Secondary | ICD-10-CM | POA: Insufficient documentation

## 2015-08-23 DIAGNOSIS — F17210 Nicotine dependence, cigarettes, uncomplicated: Secondary | ICD-10-CM | POA: Insufficient documentation

## 2015-08-23 DIAGNOSIS — Z8669 Personal history of other diseases of the nervous system and sense organs: Secondary | ICD-10-CM | POA: Diagnosis not present

## 2015-08-23 DIAGNOSIS — Z87828 Personal history of other (healed) physical injury and trauma: Secondary | ICD-10-CM | POA: Diagnosis not present

## 2015-08-23 DIAGNOSIS — Z5900 Homelessness unspecified: Secondary | ICD-10-CM
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
## 2015-08-23 NOTE — ED Notes (Signed)
Bed: WA09 Expected date:  Expected time:  Means of arrival:  Comments: EMS-incontinent, wants to be cleaned up

## 2015-08-23 NOTE — ED Notes (Signed)
Pt soiled with feces upon entering his room. Pt states he cannot clean himself and is homeless. Pt taken to decontamination room and cleaned thoroughly and provided a blue paper scrub shirt, pants and socks.

## 2015-08-23 NOTE — ED Notes (Signed)
PT ARRIVED VIA EMS AFTER HE DEFECATED ON HIMSELF, AND HE WANTS TO BE CLEANED UP. DENIES ANY OTHER COMPLAINTS AT THIS TIME.

## 2015-08-23 NOTE — Telephone Encounter (Signed)
Informed [name] that his medical release was submitted last night, 08-22-15, as requested by pt, in order to move forward on disability claim.

## 2015-09-01 ENCOUNTER — Encounter (HOSPITAL_COMMUNITY): Payer: Self-pay | Admitting: *Deleted

## 2015-09-01 ENCOUNTER — Emergency Department (HOSPITAL_COMMUNITY)
Admission: EM | Admit: 2015-09-01 | Discharge: 2015-09-01 | Disposition: A | Discharge status: Home / Self Care | Payer: Medicaid Other | Attending: Emergency Medicine | Admitting: Emergency Medicine

## 2015-09-01 DIAGNOSIS — Z7982 Long term (current) use of aspirin: Secondary | ICD-10-CM | POA: Insufficient documentation

## 2015-09-01 DIAGNOSIS — T730XXA Starvation, initial encounter: Secondary | ICD-10-CM | POA: Diagnosis not present

## 2015-09-01 DIAGNOSIS — Z79899 Other long term (current) drug therapy: Secondary | ICD-10-CM | POA: Diagnosis not present

## 2015-09-01 DIAGNOSIS — F17210 Nicotine dependence, cigarettes, uncomplicated: Secondary | ICD-10-CM | POA: Insufficient documentation

## 2015-09-01 DIAGNOSIS — R5383 Other fatigue: Secondary | ICD-10-CM | POA: Diagnosis present

## 2015-09-01 DIAGNOSIS — I10 Essential (primary) hypertension: Secondary | ICD-10-CM | POA: Diagnosis not present

## 2015-09-01 DIAGNOSIS — W19XXXA Unspecified fall, initial encounter: Secondary | ICD-10-CM

## 2015-09-01 LAB — COMPREHENSIVE METABOLIC PANEL
ALK PHOS: 91 U/L (ref 38–126)
ALT: 17 U/L (ref 17–63)
ANION GAP: 8 (ref 5–15)
AST: 18 U/L (ref 15–41)
Albumin: 3.3 g/dL — ABNORMAL LOW (ref 3.5–5.0)
BILIRUBIN TOTAL: 0.4 mg/dL (ref 0.3–1.2)
BUN: 13 mg/dL (ref 6–20)
CALCIUM: 8.6 mg/dL — AB (ref 8.9–10.3)
CO2: 27 mmol/L (ref 22–32)
CREATININE: 0.8 mg/dL (ref 0.61–1.24)
Chloride: 107 mmol/L (ref 101–111)
Glucose, Bld: 109 mg/dL — ABNORMAL HIGH (ref 65–99)
Potassium: 4.1 mmol/L (ref 3.5–5.1)
Sodium: 142 mmol/L (ref 135–145)
TOTAL PROTEIN: 6.8 g/dL (ref 6.5–8.1)

## 2015-09-01 LAB — CBC WITH DIFFERENTIAL/PLATELET
Basophils Absolute: 0 10*3/uL (ref 0.0–0.1)
Basophils Relative: 1 %
Eosinophils Absolute: 0.1 10*3/uL (ref 0.0–0.7)
Eosinophils Relative: 1 %
HEMATOCRIT: 37.1 % — AB (ref 39.0–52.0)
HEMOGLOBIN: 12.7 g/dL — AB (ref 13.0–17.0)
LYMPHS ABS: 1.3 10*3/uL (ref 0.7–4.0)
LYMPHS PCT: 20 %
MCH: 29.6 pg (ref 26.0–34.0)
MCHC: 34.2 g/dL (ref 30.0–36.0)
MCV: 86.5 fL (ref 78.0–100.0)
MONOS PCT: 7 %
Monocytes Absolute: 0.5 10*3/uL (ref 0.1–1.0)
NEUTROS PCT: 71 %
Neutro Abs: 4.5 10*3/uL (ref 1.7–7.7)
Platelets: 249 10*3/uL (ref 150–400)
RBC: 4.29 MIL/uL (ref 4.22–5.81)
RDW: 15.5 % (ref 11.5–15.5)
WBC: 6.3 10*3/uL (ref 4.0–10.5)

## 2015-09-01 MED ORDER — SODIUM CHLORIDE 0.9 % IV BOLUS (SEPSIS)
1000.0000 mL | Freq: Once | INTRAVENOUS | Status: AC
  Administered 2015-09-01: 1000 mL via INTRAVENOUS

## 2015-09-01 NOTE — ED Notes (Signed)
RN Marisue IvanLiz starting Iv right now and getting labs
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
## 2015-09-01 NOTE — ED Notes (Signed)
Bed: [id number] Expected date:  Expected time:  Means of arrival:  Comments: EMS- 50yo M, weakness/fatigue
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
## 2015-09-01 NOTE — Progress Notes (Signed)
50 yr [place] homeless pt (2 years+) with 2021 [facility] ED visits and 1 admission in last 6 months; [place]  [facility] with [facility] and have provided various resources with poor follow up from pt, noncompliance with medications This Cm last assisted pt with a standard rolling walker with assist of [facility] on 08/15/15 [facility]/15 appt that pt was taken too [facility] and [facility] SW on 4/10-11/17 when he was [facility] Chief of Staff(interactive resource center) Network engineerstaff [name] for refill of meds, f/u ED visits and assist with DME (rollator) and housing/ALF placement Refer to chart review for these notes --pending placement response but a Ambulatory referral to Vascular Surgery (lymphedema clinic) was completed Pt has been given resources for meds, transportation, pcp, home health and homelessness Pt needs to follow up on resources provided

## 2015-09-01 NOTE — Discharge Instructions (Signed)
Fatigue  Fatigue is feeling tired all of the time, a lack of energy, or a lack of motivation. Occasional or mild fatigue is often a normal response to activity or life in general. However, long-lasting (chronic) or extreme fatigue may indicate an underlying medical condition.  HOME CARE INSTRUCTIONS   Watch your fatigue for any changes. The following actions may help to lessen any discomfort you are feeling:  · Talk to your health care provider about how much sleep you need each night. Try to get the required amount every night.  · Take medicines only as directed by your health care provider.  · Eat a healthy and nutritious diet. Ask your health care provider if you need help changing your diet.  · Drink enough fluid to keep your urine clear or pale yellow.  · Practice ways of relaxing, such as yoga, meditation, massage therapy, or acupuncture.  · Exercise regularly.    · Change situations that cause you stress. Try to keep your work and personal routine reasonable.  · Do not abuse illegal drugs.  · Limit alcohol intake to no more than 1 drink per day for nonpregnant women and 2 drinks per day for men. One drink equals 12 ounces of beer, 5 ounces of wine, or 1½ ounces of hard liquor.  · Take a multivitamin, if directed by your health care provider.  SEEK MEDICAL CARE IF:   · Your fatigue does not get better.  · You have a fever.    · You have unintentional weight loss or gain.  · You have headaches.    · You have difficulty:      Falling asleep.    Sleeping throughout the night.  · You feel angry, guilty, anxious, or sad.     · You are unable to have a bowel movement (constipation).    · You skin is dry.     · Your legs or another part of your body is swollen.    SEEK IMMEDIATE MEDICAL CARE IF:   · You feel confused.    · Your vision is blurry.  · You feel faint or pass out.    · You have a severe headache.    · You have severe abdominal, pelvic, or back pain.    · You have chest pain, shortness of breath, or an  irregular or fast heartbeat.    · You are unable to urinate or you urinate less than normal.    · You develop abnormal bleeding, such as bleeding from the rectum, vagina, nose, lungs, or nipples.  · You vomit blood.     · You have thoughts about harming yourself or committing suicide.    · You are worried that you might harm someone else.       This information is not intended to replace advice given to you by your health care provider. Make sure you discuss any questions you have with your health care provider.     Document Released: 02/24/2007 Document Revised: 05/20/2014 Document Reviewed: 08/31/2013  Elsevier Interactive Patient Education ©2016 [employer]

## 2015-09-06 ENCOUNTER — Emergency Department (HOSPITAL_COMMUNITY)
Admission: EM | Admit: 2015-09-06 | Discharge: 2015-09-06 | Disposition: A | Discharge status: Home / Self Care | Payer: Medicaid Other | Attending: Emergency Medicine | Admitting: Emergency Medicine

## 2015-09-06 ENCOUNTER — Encounter (HOSPITAL_COMMUNITY): Payer: Self-pay

## 2015-09-06 DIAGNOSIS — Y9289 Other specified places as the place of occurrence of the external cause: Secondary | ICD-10-CM | POA: Insufficient documentation

## 2015-09-06 DIAGNOSIS — Z87828 Personal history of other (healed) physical injury and trauma: Secondary | ICD-10-CM | POA: Diagnosis not present

## 2015-09-06 DIAGNOSIS — Z79899 Other long term (current) drug therapy: Secondary | ICD-10-CM | POA: Diagnosis not present

## 2015-09-06 DIAGNOSIS — Z8669 Personal history of other diseases of the nervous system and sense organs: Secondary | ICD-10-CM | POA: Insufficient documentation

## 2015-09-06 DIAGNOSIS — I10 Essential (primary) hypertension: Secondary | ICD-10-CM | POA: Diagnosis not present

## 2015-09-06 DIAGNOSIS — Y998 Other external cause status: Secondary | ICD-10-CM | POA: Diagnosis not present

## 2015-09-06 DIAGNOSIS — Y9389 Activity, other specified: Secondary | ICD-10-CM | POA: Insufficient documentation

## 2015-09-06 DIAGNOSIS — Z590 Homelessness: Secondary | ICD-10-CM | POA: Insufficient documentation

## 2015-09-06 DIAGNOSIS — W19XXXA Unspecified fall, initial encounter: Secondary | ICD-10-CM

## 2015-09-06 DIAGNOSIS — R600 Localized edema: Secondary | ICD-10-CM | POA: Diagnosis not present

## 2015-09-06 DIAGNOSIS — Z043 Encounter for examination and observation following other accident: Secondary | ICD-10-CM | POA: Insufficient documentation

## 2015-09-06 DIAGNOSIS — F17210 Nicotine dependence, cigarettes, uncomplicated: Secondary | ICD-10-CM | POA: Diagnosis not present

## 2015-09-06 DIAGNOSIS — W1839XA Other fall on same level, initial encounter: Secondary | ICD-10-CM | POA: Insufficient documentation

## 2015-09-06 NOTE — ED Notes (Signed)
Per EMS- Patient had been outside for a few hours and felt weak. Patient was taken inside an air conditioned building and given 1 1/2  Bottles of water. Patient felt better. VS normal
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
## 2015-09-07 ENCOUNTER — Emergency Department (HOSPITAL_COMMUNITY)
Admission: EM | Admit: 2015-09-07 | Discharge: 2015-09-07 | Disposition: A | Discharge status: Home / Self Care | Payer: Medicaid Other | Attending: Emergency Medicine | Admitting: Emergency Medicine

## 2015-09-07 ENCOUNTER — Encounter (HOSPITAL_COMMUNITY): Payer: Self-pay

## 2015-09-07 ENCOUNTER — Emergency Department (HOSPITAL_COMMUNITY): Payer: Medicaid Other

## 2015-09-07 DIAGNOSIS — W010XXA Fall on same level from slipping, tripping and stumbling without subsequent striking against object, initial encounter: Secondary | ICD-10-CM | POA: Diagnosis not present

## 2015-09-07 DIAGNOSIS — Y998 Other external cause status: Secondary | ICD-10-CM | POA: Insufficient documentation

## 2015-09-07 DIAGNOSIS — I10 Essential (primary) hypertension: Secondary | ICD-10-CM | POA: Diagnosis not present

## 2015-09-07 DIAGNOSIS — Z590 Homelessness: Secondary | ICD-10-CM | POA: Diagnosis not present

## 2015-09-07 DIAGNOSIS — Z8669 Personal history of other diseases of the nervous system and sense organs: Secondary | ICD-10-CM | POA: Insufficient documentation

## 2015-09-07 DIAGNOSIS — M25511 Pain in right shoulder: Secondary | ICD-10-CM

## 2015-09-07 DIAGNOSIS — S4991XA Unspecified injury of right shoulder and upper arm, initial encounter: Secondary | ICD-10-CM | POA: Insufficient documentation

## 2015-09-07 DIAGNOSIS — Y92521 Bus station as the place of occurrence of the external cause: Secondary | ICD-10-CM | POA: Insufficient documentation

## 2015-09-07 DIAGNOSIS — Y9389 Activity, other specified: Secondary | ICD-10-CM | POA: Diagnosis not present

## 2015-09-07 MED ORDER — IBUPROFEN 200 MG PO TABS
400.0000 mg | ORAL_TABLET | Freq: Once | ORAL | Status: AC
  Administered 2015-09-07: 400 mg via ORAL
  Filled 2015-09-07: qty 2
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
## 2015-09-07 NOTE — ED Notes (Signed)
Pt transported to xray 

## 2015-09-07 NOTE — ED Notes (Signed)
Per [place] over [place]. Usually ambulates w/ walker. Pt rolled on ground, did not hit head, c/o right shoulder pain. No neuro deficits. Pt has not taken home meds. Pt a&o x 4.

## 2015-09-08 ENCOUNTER — Emergency Department (HOSPITAL_COMMUNITY)
Admission: EM | Admit: 2015-09-08 | Discharge: 2015-09-09 | Disposition: A | Discharge status: Home / Self Care | Payer: Medicaid Other | Source: Home / Self Care | Attending: Emergency Medicine | Admitting: Emergency Medicine

## 2015-09-08 ENCOUNTER — Emergency Department (HOSPITAL_COMMUNITY): Payer: Medicaid Other

## 2015-09-08 ENCOUNTER — Encounter (HOSPITAL_COMMUNITY): Payer: Self-pay | Admitting: Emergency Medicine

## 2015-09-08 DIAGNOSIS — Z043 Encounter for examination and observation following other accident: Secondary | ICD-10-CM | POA: Diagnosis present

## 2015-09-08 DIAGNOSIS — X30XXXA Exposure to excessive natural heat, initial encounter: Secondary | ICD-10-CM | POA: Diagnosis not present

## 2015-09-08 DIAGNOSIS — R600 Localized edema: Secondary | ICD-10-CM

## 2015-09-08 DIAGNOSIS — I10 Essential (primary) hypertension: Secondary | ICD-10-CM

## 2015-09-08 DIAGNOSIS — R062 Wheezing: Secondary | ICD-10-CM | POA: Diagnosis not present

## 2015-09-08 DIAGNOSIS — R0989 Other specified symptoms and signs involving the circulatory and respiratory systems: Secondary | ICD-10-CM

## 2015-09-08 DIAGNOSIS — R5383 Other fatigue: Secondary | ICD-10-CM | POA: Diagnosis not present

## 2015-09-08 DIAGNOSIS — Z5900 Homelessness unspecified: Secondary | ICD-10-CM

## 2015-09-08 DIAGNOSIS — F17210 Nicotine dependence, cigarettes, uncomplicated: Secondary | ICD-10-CM | POA: Insufficient documentation

## 2015-09-08 DIAGNOSIS — Y998 Other external cause status: Secondary | ICD-10-CM | POA: Diagnosis not present

## 2015-09-08 DIAGNOSIS — Z7982 Long term (current) use of aspirin: Secondary | ICD-10-CM | POA: Insufficient documentation

## 2015-09-08 DIAGNOSIS — Z8669 Personal history of other diseases of the nervous system and sense organs: Secondary | ICD-10-CM | POA: Diagnosis not present

## 2015-09-08 DIAGNOSIS — Z792 Long term (current) use of antibiotics: Secondary | ICD-10-CM | POA: Insufficient documentation

## 2015-09-08 DIAGNOSIS — Y9389 Activity, other specified: Secondary | ICD-10-CM | POA: Diagnosis not present

## 2015-09-08 DIAGNOSIS — Y9289 Other specified places as the place of occurrence of the external cause: Secondary | ICD-10-CM | POA: Diagnosis not present

## 2015-09-08 DIAGNOSIS — Z79899 Other long term (current) drug therapy: Secondary | ICD-10-CM

## 2015-09-08 DIAGNOSIS — R05 Cough: Secondary | ICD-10-CM | POA: Diagnosis not present

## 2015-09-08 LAB — CBC WITH DIFFERENTIAL/PLATELET
Basophils Absolute: 0 10*3/uL (ref 0.0–0.1)
Basophils Relative: 1 %
Eosinophils Absolute: 0.1 10*3/uL (ref 0.0–0.7)
Eosinophils Relative: 1 %
HCT: 36.8 % — ABNORMAL LOW (ref 39.0–52.0)
Hemoglobin: 12.7 g/dL — ABNORMAL LOW (ref 13.0–17.0)
LYMPHS ABS: 1.2 10*3/uL (ref 0.7–4.0)
LYMPHS PCT: 15 %
MCH: 29.5 pg (ref 26.0–34.0)
MCHC: 34.5 g/dL (ref 30.0–36.0)
MCV: 85.4 fL (ref 78.0–100.0)
MONOS PCT: 7 %
Monocytes Absolute: 0.6 10*3/uL (ref 0.1–1.0)
Neutro Abs: 6.1 10*3/uL (ref 1.7–7.7)
Neutrophils Relative %: 76 %
Platelets: 238 10*3/uL (ref 150–400)
RBC: 4.31 MIL/uL (ref 4.22–5.81)
RDW: 15.3 % (ref 11.5–15.5)
WBC: 8 10*3/uL (ref 4.0–10.5)

## 2015-09-08 LAB — BASIC METABOLIC PANEL
Anion gap: 9 (ref 5–15)
BUN: 17 mg/dL (ref 6–20)
CHLORIDE: 106 mmol/L (ref 101–111)
CO2: 26 mmol/L (ref 22–32)
CREATININE: 0.81 mg/dL (ref 0.61–1.24)
Calcium: 8.5 mg/dL — ABNORMAL LOW (ref 8.9–10.3)
GFR calc Af Amer: >60 mL/min (ref >60)
GFR calc non Af Amer: >60 mL/min (ref >60)
GLUCOSE: 109 mg/dL — AB (ref 65–99)
Potassium: 4 mmol/L (ref 3.5–5.1)
SODIUM: 141 mmol/L (ref 135–145)

## 2015-09-08 LAB — BRAIN NATRIURETIC PEPTIDE: B Natriuretic Peptide: 19.4 pg/mL (ref 0.0–100.0)

## 2015-09-08 MED ORDER — SODIUM CHLORIDE 0.9 % IV BOLUS (SEPSIS): 1000.0000 mL | Freq: Once | INTRAVENOUS | Status: DC

## 2015-09-08 MED ORDER — AZITHROMYCIN 250 MG PO TABS
1000.0000 mg | ORAL_TABLET | Freq: Once | ORAL | Status: AC
  Administered 2015-09-09: 1000 mg via ORAL
  Filled 2015-09-08: qty 4

## 2015-09-08 NOTE — ED Notes (Addendum)
[facility] complaints of weakness after sitting in the sun for "many hours". Pt is alert and oriented x 4. [facility] Pt has complaints of pain in his right leg that he ranks at 8/10. Pt is not participating in assessment. Pt is also refusing to change into a gown.
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
## 2015-09-09 ENCOUNTER — Encounter (HOSPITAL_COMMUNITY): Payer: Self-pay | Admitting: Emergency Medicine

## 2015-09-09 ENCOUNTER — Emergency Department (HOSPITAL_COMMUNITY)
Admission: EM | Admit: 2015-09-09 | Discharge: 2015-09-09 | Disposition: A | Discharge status: Home / Self Care | Payer: Medicaid Other | Attending: Emergency Medicine | Admitting: Emergency Medicine

## 2015-09-09 DIAGNOSIS — R062 Wheezing: Secondary | ICD-10-CM

## 2015-09-09 DIAGNOSIS — X30XXXA Exposure to excessive natural heat, initial encounter: Secondary | ICD-10-CM | POA: Insufficient documentation

## 2015-09-09 DIAGNOSIS — R05 Cough: Secondary | ICD-10-CM | POA: Insufficient documentation

## 2015-09-09 DIAGNOSIS — R5383 Other fatigue: Secondary | ICD-10-CM

## 2015-09-09 DIAGNOSIS — Z5900 Homelessness unspecified: Secondary | ICD-10-CM

## 2015-09-09 DIAGNOSIS — I10 Essential (primary) hypertension: Secondary | ICD-10-CM | POA: Insufficient documentation

## 2015-09-09 DIAGNOSIS — Y9389 Activity, other specified: Secondary | ICD-10-CM | POA: Insufficient documentation

## 2015-09-09 DIAGNOSIS — R059 Cough, unspecified: Secondary | ICD-10-CM

## 2015-09-09 DIAGNOSIS — Z8669 Personal history of other diseases of the nervous system and sense organs: Secondary | ICD-10-CM | POA: Insufficient documentation

## 2015-09-09 DIAGNOSIS — T679XXA Effect of heat and light, unspecified, initial encounter: Secondary | ICD-10-CM

## 2015-09-09 DIAGNOSIS — Y998 Other external cause status: Secondary | ICD-10-CM | POA: Insufficient documentation

## 2015-09-09 DIAGNOSIS — Y9289 Other specified places as the place of occurrence of the external cause: Secondary | ICD-10-CM | POA: Insufficient documentation

## 2015-09-09 MED ORDER — AZITHROMYCIN 250 MG PO TABS: 250.0000 mg | ORAL_TABLET | Freq: Every day | ORAL | Status: DC

## 2015-09-09 MED ORDER — ALBUTEROL SULFATE (2.5 MG/3ML) 0.083% IN NEBU
5.0000 mg | INHALATION_SOLUTION | Freq: Once | RESPIRATORY_TRACT | Status: AC
  Administered 2015-09-09: 5 mg via RESPIRATORY_TRACT
  Filled 2015-09-09: qty 6

## 2015-09-09 MED ORDER — ALBUTEROL SULFATE HFA 108 (90 BASE) MCG/ACT IN AERS
2.0000 | INHALATION_SPRAY | Freq: Once | RESPIRATORY_TRACT | Status: AC
  Administered 2015-09-09: 2 via RESPIRATORY_TRACT
  Filled 2015-09-09: qty 6.7

## 2015-09-09 MED ORDER — AZITHROMYCIN 250 MG PO TABS
250.0000 mg | ORAL_TABLET | Freq: Once | ORAL | Status: AC
  Administered 2015-09-09: 250 mg via ORAL
  Filled 2015-09-09: qty 1

## 2015-09-09 MED ORDER — IPRATROPIUM BROMIDE 0.02 % IN SOLN
0.5000 mg | Freq: Once | RESPIRATORY_TRACT | Status: AC
  Administered 2015-09-09: 0.5 mg via RESPIRATORY_TRACT
  Filled 2015-09-09: qty 2.5

## 2015-09-09 NOTE — Care Management Note (Signed)
Case Management Note  Patient Details  Name: [name] MRN: [id number] Date of Birth: 08/30/1965  Subjective/Objective:       Generalized weakness             Action/Plan: Discharge Planning:  [facility], provided contact number on dc paperwork to arrange appt. [name] will provide pt with bus passes to get to his appt.  Expected Discharge Date: 09/09/2015           Expected Discharge Plan:  Home/Self Care  In-House Referral:  Clinical Social Work  Discharge planning Services  CM Consult  Post Acute Care Choice:  NA Choice offered to:  NA  DME Arranged:  N/A DME Agency:  NA  HH Arranged:  NA HH Agency:  NA  Status of Service:  Completed, signed off  Medicare Important Message Given:    Date Medicare IM Given:    Medicare IM give by:    Date Additional Medicare IM Given:    Additional Medicare Important Message give by:     If discussed at Long Length of Stay Meetings, dates discussed:    Additional Comments:  [name], [name], RN 09/09/2015, 4:33 PM

## 2015-09-09 NOTE — ED Notes (Signed)
Pt arrived via EMS with report of generalized weakness s/p [facility] Pt denies hitting head, LOC, dizziness/lightheadedness, and n/v. And NIH scale 0.

## 2015-09-09 NOTE — Discharge Instructions (Signed)
Take your medications as prescribed for your pulmonary congestion. Please follow-up with your doctor in the next 2 days for reevaluation. Return to ED for new or worsening symptoms.

## 2015-09-09 NOTE — ED Notes (Signed)
[facility] Expected date: 09/09/15 Expected time: 2:17 PM Means of arrival:  Comments: Fall
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
## 2015-09-09 NOTE — Clinical Social Work Note (Signed)
[name] met with pt to assess for services.  [name] provided explanation of shelters in the community.  [name] connected with case manager who related that pt has [facility].  Pt reported that he had just been to this clinic last week and "everything ok".  [name] inquired as to why pt had not taken his anti biotic when he was discharged this week and he reported "transportation is an issue" so pt was provided with 6 bus passes to get his medications, get to a shelter and follow up with his clinic if necessary.  [name] also found a 24 hours pharmacy ([facility] ) and provided this information to pt.  [name] provided pt with [facility] 24 hours crisis and help line as well as address and phone number to make appointment for psychiatry, counseling or pharmacy.  [name] explained the 800 help line that is availalbel 24 hours that may provide additional information located in the county.   Dede Query, LCSW [place] Worker - Weekend Coverage cell #: [phone]

## 2015-09-09 NOTE — Discharge Instructions (Signed)
Stay well hydrated, and stay cool and in the shade. Use the resource guide given to you today to find a homeless shelter in order to stay out of the heat. Take the antibiotic as directed, starting tomorrow 09/10/15. Use the inhaler as directed as needed for cough, shortness of breath, or wheezing. Follow up with [facility] in 5-7 days for recheck of symptoms and ongoing medical care. Return to the ER for changes or worsening symptoms.   Heat Exhaustion Information WHAT IS HEAT EXHAUSTION? Heat exhaustion happens when your body gets overheated from hot weather or from exercise. Heat exhaustion makes the temperature of your skin and body go up. Your body cools itself by sweating. If you do not drink enough water to replace what you sweat, you lose too much water and salt. This makes it harder for your body to produce more sweat. When you do not sweat enough, your body cannot cool down, and heat exhaustion may result. Heat exhaustion can lead to heatstroke, which is a more serious illness. WHO IS AT RISK FOR HEAT EXHAUSTION? Anyone can get heat exhaustion. However, heat exhaustion is more likely when you are exercising or doing a physical activity. It is also more likely when you are in hot and humid weather or bright sunshine. Heat exhaustion is also more likely to develop in: 1. People who are age 24 or older. 2. Children. 3. People who have a medical condition such as heart disease, poor circulation, sickle cell disease, or high blood pressure. 4. People who have a fever. 5. People who are very overweight (obese). 6. People who are dehydrated from: 1. Drinking alcohol or caffeine. 2. Taking certain medicines, such as diuretics or stimulants. WHAT ARE THE SYMPTOMS OF HEAT EXHAUSTION? Symptoms of heat exhaustion include: 1. A body temperature of up to 104F (40C). 2. Moist, cool, and clammy skin. 3. Dizziness. 4. Headache. 5. Nausea. 6. Fatigue. 7. Thirst. 8. Dark-colored  urine. 9. Rapid pulse or heartbeat. 10. Weakness. 11. Muscle cramps. 12. Confusion. 13. Fainting. WHAT SHOULD I DO IF I THINK I HAVE HEAT EXHAUSTION? If you think that you have heat exhaustion, call your health care provider. Follow his or her instructions. You should also:  Call a friend or a family member and ask someone to stay with you.  Move to a cooler location, such as:  Into the shade.  In front of a fan.  Someplace that has air conditioning.  Lie down and rest.  Slowly drink nonalcoholic, caffeine-free fluids.  Take off any extra clothing or tight-fitting clothes.  Take a cool bath or shower, if possible. If you do not have access to a bath or shower, dab or mist cool water on your skin. WHY IS IT IMPORTANT TO TREAT HEAT EXHAUSTION? It is extremely important to take care of yourself and treat heat exhaustion as soon as possible. Untreated heat exhaustion can turn into heatstroke. Symptoms of heatstroke include:  A body temperature of 104F (40C) or higher.  Hot, red skin that may be dry or moist.  Severe headache.  Nausea and vomiting.  Muscle weakness and cramping.  Confusion.  Rapid breathing.  Fainting.  Seizure. These symptoms may represent a serious problem that is an emergency. Do not wait to see if the symptoms will go away. Get medical help right away. Call your local emergency services (911 in the [place] Do not drive yourself to the hospital. Heatstroke is a life-threatening condition that requires urgent medical treatment. Do not treat  heatstroke at home. Heatstroke should be treated by a health care professional and may require hospitalization. At the hospital, you may need to receive fluids through an IV tube:  If you cannot drink any fluids.  If you vomit any fluids that you drink.  If your symptoms do not get better after one hour.  If your symptoms get worse after one hour. HOW CAN I PREVENT HEAT EXHAUSTION?  Avoid outdoor activities  on very hot or humid days.  Do not exercise or do other physical activity when you are not feeling well.  Drink plenty of nonalcoholic and caffeine-free fluids before and during physical activity.  Take frequent breaks for rest during physical activity.  Wear light-colored, loose-fitting, and lightweight clothing in the heat.  Wear a hat and use sunscreen when exercising outdoors.  Avoid being outside during the hottest times of the day.  Check with your health care provider before you start any new activity, especially if you take medicine or have a medical condition.  Start any new activity slowly and work up to your fitness level. HOW CAN I HELP TO PROTECT ELDERLY RELATIVES AND NEIGHBORS FROM HEAT EXHAUSTION? People who are age 67 or older are at greater risk for heat exhaustion. Their bodies have a harder time adjusting to heat. They are also more likely to have a medical condition or be on medicines that increase their risk for heat exhaustion. They may get heat exhaustion indoors if the heat is high for several days. You can help to protect them during hot weather by:  Checking on them two or more times each day.  Making sure that they are drinking plenty of cool, nonalcoholic, and caffeine-free fluids.  Making sure that they use their air conditioner.  Taking them to a location where air conditioning is available.  Talking with their health care provider about their medical needs, medicines, and fluid requirements.   This information is not intended to replace advice given to you by your health care provider. Make sure you discuss any questions you have with your health care provider.   Document Released: 02/06/2008 Document Revised: 01/18/2015 Document Reviewed: 04/06/2014 Elsevier Interactive Patient Education 2016 Elsevier Inc.  Cough, Adult A cough helps to clear your throat and lungs. A cough may last only 2-3 weeks (acute), or it may last longer than 8 weeks  (chronic). Many different things can cause a cough. A cough may be a sign of an illness or another medical condition. HOME CARE 7. Pay attention to any changes in your cough. 8. Take medicines only as told by your doctor. 1. If you were prescribed an antibiotic medicine, take it as told by your doctor. Do not stop taking it even if you start to feel better. 2. Talk with your doctor before you try using a cough medicine. 9. Drink enough fluid to keep your pee (urine) clear or pale yellow. 10. If the air is dry, use a cold steam vaporizer or humidifier in your home. 11. Stay away from things that make you cough at work or at home. 12. If your cough is worse at night, try using extra pillows to raise your head up higher while you sleep. 13. Do not smoke, and try not to be around smoke. If you need help quitting, ask your doctor. 14. Do not have caffeine. 15. Do not drink alcohol. 16. Rest as needed. GET HELP IF: 14. You have new problems (symptoms). 15. You cough up yellow fluid (pus). 16. Your cough  does not get better after 2-3 weeks, or your cough gets worse. 17. Medicine does not help your cough and you are not sleeping well. 18. You have pain that gets worse or pain that is not helped with medicine. 19. You have a fever. 20. You are losing weight and you do not know why. 21. You have night sweats. GET HELP RIGHT AWAY IF:  You cough up blood.  You have trouble breathing.  Your heartbeat is very fast.   This information is not intended to replace advice given to you by your health care provider. Make sure you discuss any questions you have with your health care provider.   Document Released: 01/10/2011 Document Revised: 01/18/2015 Document Reviewed: 07/06/2014 Elsevier Interactive Patient Education 2016 [employer].  How to Use an Inhaler Proper inhaler technique is very important. Good technique ensures that the medicine reaches the lungs. Poor technique results in depositing  the medicine on the tongue and back of the throat rather than in the airways. If you do not use the inhaler with good technique, the medicine will not help you. STEPS TO FOLLOW IF USING AN INHALER WITHOUT AN EXTENSION TUBE 17. Remove the cap from the inhaler. 18. If you are using the inhaler for the first time, you will need to prime it. Shake the inhaler for 5 seconds and release four puffs into the air, away from your face. Ask your health care provider or pharmacist if you have questions about priming your inhaler. 19. Shake the inhaler for 5 seconds before each breath in (inhalation). 20. Position the inhaler so that the top of the canister faces up. 21. Put your index finger on the top of the medicine canister. Your thumb supports the bottom of the inhaler. 22. Open your mouth. 23. Either place the inhaler between your teeth and place your lips tightly around the mouthpiece, or hold the inhaler 1-2 inches away from your open mouth. If you are unsure of which technique to use, ask your health care provider. 24. Breathe out (exhale) normally and as completely as possible. 25. Press the canister down with your index finger to release the medicine. 26. At the same time as the canister is pressed, inhale deeply and slowly until your lungs are completely filled. This should take 4-6 seconds. Keep your tongue down. 27. Hold the medicine in your lungs for 5-10 seconds (10 seconds is best). This helps the medicine get into the small airways of your lungs. 28. Breathe out slowly, through pursed lips. Whistling is an example of pursed lips. 29. Wait at least 15-30 seconds between puffs. Continue with the above steps until you have taken the number of puffs your health care provider has ordered. Do not use the inhaler more than your health care provider tells you. 30. Replace the cap on the inhaler. 31. Follow the directions from your health care provider or the inhaler insert for cleaning the  inhaler. STEPS TO FOLLOW IF USING AN INHALER WITH AN EXTENSION (SPACER) 22. Remove the cap from the inhaler. 23. If you are using the inhaler for the first time, you will need to prime it. Shake the inhaler for 5 seconds and release four puffs into the air, away from your face. Ask your health care provider or pharmacist if you have questions about priming your inhaler. 24. Shake the inhaler for 5 seconds before each breath in (inhalation). 25. Place the open end of the spacer onto the mouthpiece of the inhaler. 26. Position the inhaler  so that the top of the canister faces up and the spacer mouthpiece faces you. 27. Put your index finger on the top of the medicine canister. Your thumb supports the bottom of the inhaler and the spacer. 28. Breathe out (exhale) normally and as completely as possible. 29. Immediately after exhaling, place the spacer between your teeth and into your mouth. Close your lips tightly around the spacer. 30. Press the canister down with your index finger to release the medicine. 31. At the same time as the canister is pressed, inhale deeply and slowly until your lungs are completely filled. This should take 4-6 seconds. Keep your tongue down and out of the way. 32. Hold the medicine in your lungs for 5-10 seconds (10 seconds is best). This helps the medicine get into the small airways of your lungs. Exhale. 33. Repeat inhaling deeply through the spacer mouthpiece. Again hold that breath for up to 10 seconds (10 seconds is best). Exhale slowly. If it is difficult to take this second deep breath through the spacer, breathe normally several times through the spacer. Remove the spacer from your mouth. 34. Wait at least 15-30 seconds between puffs. Continue with the above steps until you have taken the number of puffs your health care provider has ordered. Do not use the inhaler more than your health care provider tells you. 35. Remove the spacer from the inhaler, and place the cap  on the inhaler. 36. Follow the directions from your health care provider or the inhaler insert for cleaning the inhaler and spacer. If you are using different kinds of inhalers, use your quick relief medicine to open the airways 10-15 minutes before using a steroid if instructed to do so by your health care provider. If you are unsure which inhalers to use and the order of using them, ask your health care provider, nurse, or respiratory therapist. If you are using a steroid inhaler, always rinse your mouth with water after your last puff, then gargle and spit out the water. Do not swallow the water. AVOID:  Inhaling before or after starting the spray of medicine. It takes practice to coordinate your breathing with triggering the spray.  Inhaling through the nose (rather than the mouth) when triggering the spray. HOW TO DETERMINE IF YOUR INHALER IS FULL OR NEARLY EMPTY You cannot know when an inhaler is empty by shaking it. A few inhalers are now being made with dose counters. Ask your health care provider for a prescription that has a dose counter if you feel you need that extra help. If your inhaler does not have a counter, ask your health care provider to help you determine the date you need to refill your inhaler. Write the refill date on a calendar or your inhaler canister. Refill your inhaler 7-10 days before it runs out. Be sure to keep an adequate supply of medicine. This includes making sure it is not expired, and that you have a spare inhaler.  SEEK MEDICAL CARE IF:   Your symptoms are only partially relieved with your inhaler.  You are having trouble using your inhaler.  You have some increase in phlegm. SEEK IMMEDIATE MEDICAL CARE IF:   You feel little or no relief with your inhalers. You are still wheezing and are feeling shortness of breath or tightness in your chest or both.  You have dizziness, headaches, or a fast heart rate.  You have chills, fever, or night sweats.  You  have a noticeable increase in phlegm production,  or there is blood in the phlegm. MAKE SURE YOU:   Understand these instructions.  Will watch your condition.  Will get help right away if you are not doing well or get worse.   This information is not intended to replace advice given to you by your health care provider. Make sure you discuss any questions you have with your health care provider.   Document Released: 04/26/2000 Document Revised: 02/17/2013 Document Reviewed: 11/26/2012 Elsevier Interactive Patient Education [employer]2016 Elsevier Inc.
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
## 2015-09-09 NOTE — ED Notes (Signed)
Called social work per [employer] to see patient

## 2015-09-10 ENCOUNTER — Emergency Department (HOSPITAL_COMMUNITY)
Admission: EM | Admit: 2015-09-10 | Discharge: 2015-09-10 | Disposition: A | Discharge status: Home / Self Care | Payer: Medicaid Other | Attending: Emergency Medicine | Admitting: Emergency Medicine

## 2015-09-10 ENCOUNTER — Encounter (HOSPITAL_COMMUNITY): Payer: Self-pay

## 2015-09-10 DIAGNOSIS — I890 Lymphedema, not elsewhere classified: Secondary | ICD-10-CM | POA: Insufficient documentation

## 2015-09-10 DIAGNOSIS — F17210 Nicotine dependence, cigarettes, uncomplicated: Secondary | ICD-10-CM | POA: Insufficient documentation

## 2015-09-10 DIAGNOSIS — Z7982 Long term (current) use of aspirin: Secondary | ICD-10-CM | POA: Diagnosis not present

## 2015-09-10 DIAGNOSIS — R062 Wheezing: Secondary | ICD-10-CM | POA: Diagnosis not present

## 2015-09-10 DIAGNOSIS — Z79899 Other long term (current) drug therapy: Secondary | ICD-10-CM | POA: Insufficient documentation

## 2015-09-10 DIAGNOSIS — Z5900 Homelessness unspecified: Secondary | ICD-10-CM

## 2015-09-10 DIAGNOSIS — I10 Essential (primary) hypertension: Secondary | ICD-10-CM | POA: Insufficient documentation

## 2015-09-10 DIAGNOSIS — M7989 Other specified soft tissue disorders: Secondary | ICD-10-CM | POA: Diagnosis present

## 2015-09-10 MED ORDER — IPRATROPIUM-ALBUTEROL 0.5-2.5 (3) MG/3ML IN SOLN
3.0000 mL | Freq: Once | RESPIRATORY_TRACT | Status: AC
  Administered 2015-09-10: 3 mL via RESPIRATORY_TRACT
  Filled 2015-09-10: qty 3

## 2015-09-10 MED ORDER — AMMONIA AROMATIC IN INHA
RESPIRATORY_TRACT | Status: AC
  Filled 2015-09-10: qty 10

## 2015-09-10 MED ORDER — AZITHROMYCIN 250 MG PO TABS
250.0000 mg | ORAL_TABLET | Freq: Once | ORAL | Status: AC
  Administered 2015-09-10: 250 mg via ORAL
  Filled 2015-09-10: qty 1
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
## 2015-09-10 NOTE — Discharge Instructions (Signed)
Bronchospasm, Adult A bronchospasm is when the tubes that carry air in and out of your lungs (airways) spasm or tighten. During a bronchospasm it is hard to breathe. This is because the airways get smaller. A bronchospasm can be triggered by:  Allergies. These may be to animals, pollen, food, or mold.  Infection. This is a common cause of bronchospasm.  Exercise.  Irritants. These include pollution, cigarette smoke, strong odors, aerosol sprays, and paint fumes.  Weather changes.  Stress.  Being emotional. HOME CARE   Always have a plan for getting help. Know when to call your doctor and local emergency services (911 in the [place] Know where you can get emergency care.  Only take medicines as told by your doctor.  If you were prescribed an inhaler or nebulizer machine, ask your doctor how to use it correctly. Always use a spacer with your inhaler if you were given one.  Stay calm during an attack. Try to relax and breathe more slowly.  Control your home environment:  Change your heating and air conditioning filter at least once a month.  Limit your use of fireplaces and wood stoves.  Do not  smoke. Do not  allow smoking in your home.  Avoid perfumes and fragrances.  Get rid of pests (such as roaches and mice) and their droppings.  Throw away plants if you see mold on them.  Keep your house clean and dust free.  Replace carpet with wood, tile, or vinyl flooring. Carpet can trap dander and dust.  Use allergy-proof pillows, mattress covers, and box spring covers.  Wash bed sheets and blankets every week in hot water. Dry them in a dryer.  Use blankets that are made of polyester or cotton.  Wash hands frequently. GET HELP IF:  You have muscle aches.  You have chest pain.  The thick spit you spit or cough up (sputum) changes from clear or white to yellow, green, gray, or bloody.  The thick spit you spit or cough up gets thicker.  There are problems that may be  related to the medicine you are given such as:  A rash.  Itching.  Swelling.  Trouble breathing. GET HELP RIGHT AWAY IF:  You feel you cannot breathe or catch your breath.  You cannot stop coughing.  Your treatment is not helping you breathe better.  You have very bad chest pain. MAKE SURE YOU:   Understand these instructions.  Will watch your condition.  Will get help right away if you are not doing well or get worse.   This information is not intended to replace advice given to you by your health care provider. Make sure you discuss any questions you have with your health care provider.   Document Released: 02/24/2009 Document Revised: 05/20/2014 Document Reviewed: 10/20/2012 Elsevier Interactive Patient Education 2016 [employer]  

## 2015-09-10 NOTE — ED Notes (Signed)
Pt noncompliant with assisting in transferring to wc. Pt aware he has resources for help. Would not sign dc papers

## 2015-09-10 NOTE — ED Notes (Signed)
Bed: [facility]A Expected date:  Expected time:  Means of arrival:  Comments: 

## 2015-09-10 NOTE — ED Notes (Signed)
Pt presents with c/o leg swelling. Pt has leg swelling at his baseline. Per EMS, pt is conscious, alert, and oriented but reports he is lethargic at times. Pt would not help EMS move himself from to stretcher, reported his legs hurt too bad.

## 2015-09-10 NOTE — ED Notes (Signed)
Bed: WA09 Expected date:  Expected time:  Means of arrival:  Comments: EMS 

## 2015-09-13 ENCOUNTER — Emergency Department (HOSPITAL_COMMUNITY)
Admission: EM | Admit: 2015-09-13 | Discharge: 2015-09-13 | Disposition: A | Discharge status: Home / Self Care | Payer: Medicaid Other | Attending: Emergency Medicine | Admitting: Emergency Medicine

## 2015-09-13 ENCOUNTER — Emergency Department (HOSPITAL_COMMUNITY): Payer: Medicaid Other

## 2015-09-13 ENCOUNTER — Encounter (HOSPITAL_COMMUNITY): Payer: Self-pay | Admitting: *Deleted

## 2015-09-13 DIAGNOSIS — I10 Essential (primary) hypertension: Secondary | ICD-10-CM | POA: Diagnosis not present

## 2015-09-13 DIAGNOSIS — Z79899 Other long term (current) drug therapy: Secondary | ICD-10-CM | POA: Diagnosis not present

## 2015-09-13 DIAGNOSIS — M549 Dorsalgia, unspecified: Secondary | ICD-10-CM | POA: Insufficient documentation

## 2015-09-13 DIAGNOSIS — S0012XA Contusion of left eyelid and periocular area, initial encounter: Secondary | ICD-10-CM | POA: Diagnosis not present

## 2015-09-13 DIAGNOSIS — Y9389 Activity, other specified: Secondary | ICD-10-CM | POA: Diagnosis not present

## 2015-09-13 DIAGNOSIS — F17210 Nicotine dependence, cigarettes, uncomplicated: Secondary | ICD-10-CM | POA: Insufficient documentation

## 2015-09-13 DIAGNOSIS — W1839XA Other fall on same level, initial encounter: Secondary | ICD-10-CM | POA: Insufficient documentation

## 2015-09-13 DIAGNOSIS — Y998 Other external cause status: Secondary | ICD-10-CM | POA: Diagnosis not present

## 2015-09-13 DIAGNOSIS — Z8669 Personal history of other diseases of the nervous system and sense organs: Secondary | ICD-10-CM | POA: Diagnosis not present

## 2015-09-13 DIAGNOSIS — Z5900 Homelessness unspecified: Secondary | ICD-10-CM

## 2015-09-13 DIAGNOSIS — Z7982 Long term (current) use of aspirin: Secondary | ICD-10-CM | POA: Insufficient documentation

## 2015-09-13 DIAGNOSIS — I890 Lymphedema, not elsewhere classified: Secondary | ICD-10-CM | POA: Insufficient documentation

## 2015-09-13 DIAGNOSIS — Y9289 Other specified places as the place of occurrence of the external cause: Secondary | ICD-10-CM | POA: Insufficient documentation

## 2015-09-13 DIAGNOSIS — R531 Weakness: Secondary | ICD-10-CM | POA: Diagnosis present

## 2015-09-13 LAB — CBC WITH DIFFERENTIAL/PLATELET
Basophils Absolute: 0 10*3/uL (ref 0.0–0.1)
Basophils Relative: 0 %
EOS PCT: 1 %
Eosinophils Absolute: 0.1 10*3/uL (ref 0.0–0.7)
HEMATOCRIT: 40 % (ref 39.0–52.0)
HEMOGLOBIN: 13.3 g/dL (ref 13.0–17.0)
LYMPHS ABS: 1 10*3/uL (ref 0.7–4.0)
LYMPHS PCT: 13 %
MCH: 29.1 pg (ref 26.0–34.0)
MCHC: 33.3 g/dL (ref 30.0–36.0)
MCV: 87.5 fL (ref 78.0–100.0)
Monocytes Absolute: 0.6 10*3/uL (ref 0.1–1.0)
Monocytes Relative: 7 %
Neutro Abs: 6 10*3/uL (ref 1.7–7.7)
Neutrophils Relative %: 79 %
Platelets: 271 10*3/uL (ref 150–400)
RBC: 4.57 MIL/uL (ref 4.22–5.81)
RDW: 15.2 % (ref 11.5–15.5)
WBC: 7.6 10*3/uL (ref 4.0–10.5)

## 2015-09-13 LAB — COMPREHENSIVE METABOLIC PANEL
ALK PHOS: 99 U/L (ref 38–126)
ALT: 22 U/L (ref 17–63)
AST: 16 U/L (ref 15–41)
Albumin: 3.5 g/dL (ref 3.5–5.0)
Anion gap: 8 (ref 5–15)
BILIRUBIN TOTAL: 0.6 mg/dL (ref 0.3–1.2)
BUN: 8 mg/dL (ref 6–20)
CO2: 29 mmol/L (ref 22–32)
CREATININE: 0.78 mg/dL (ref 0.61–1.24)
Calcium: 8.9 mg/dL (ref 8.9–10.3)
Chloride: 104 mmol/L (ref 101–111)
Glucose, Bld: 85 mg/dL (ref 65–99)
Potassium: 5.1 mmol/L (ref 3.5–5.1)
SODIUM: 141 mmol/L (ref 135–145)
Total Protein: 6.7 g/dL (ref 6.5–8.1)

## 2015-09-13 LAB — URINALYSIS, ROUTINE W REFLEX MICROSCOPIC
Bilirubin Urine: NEGATIVE
GLUCOSE, UA: NEGATIVE mg/dL
HGB URINE DIPSTICK: NEGATIVE
Ketones, ur: NEGATIVE mg/dL
Leukocytes, UA: NEGATIVE
Nitrite: NEGATIVE
PH: 7 (ref 5.0–8.0)
Protein, ur: NEGATIVE mg/dL
SPECIFIC GRAVITY, URINE: 1.019 (ref 1.005–1.030)

## 2015-09-13 NOTE — ED Notes (Addendum)
Per EMS, pt complains of feeling tired due to sitting in the sun for an hour today. Pt denies pain. VSS, CBG 90.

## 2015-09-13 NOTE — ED Notes (Signed)
Per radiology tech, patient is declining x-rays
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
## 2015-09-15 ENCOUNTER — Encounter (HOSPITAL_COMMUNITY): Payer: Self-pay | Admitting: *Deleted

## 2015-09-15 ENCOUNTER — Emergency Department (HOSPITAL_COMMUNITY)
Admission: EM | Admit: 2015-09-15 | Discharge: 2015-09-15 | Disposition: A | Discharge status: Home / Self Care | Payer: Medicaid Other | Attending: Emergency Medicine | Admitting: Emergency Medicine

## 2015-09-15 ENCOUNTER — Emergency Department (HOSPITAL_COMMUNITY): Payer: Medicaid Other

## 2015-09-15 DIAGNOSIS — Z872 Personal history of diseases of the skin and subcutaneous tissue: Secondary | ICD-10-CM | POA: Insufficient documentation

## 2015-09-15 DIAGNOSIS — W19XXXA Unspecified fall, initial encounter: Secondary | ICD-10-CM

## 2015-09-15 DIAGNOSIS — Y9389 Activity, other specified: Secondary | ICD-10-CM | POA: Diagnosis not present

## 2015-09-15 DIAGNOSIS — M25521 Pain in right elbow: Secondary | ICD-10-CM

## 2015-09-15 DIAGNOSIS — S59901A Unspecified injury of right elbow, initial encounter: Secondary | ICD-10-CM | POA: Insufficient documentation

## 2015-09-15 DIAGNOSIS — Z79899 Other long term (current) drug therapy: Secondary | ICD-10-CM | POA: Insufficient documentation

## 2015-09-15 DIAGNOSIS — Z7982 Long term (current) use of aspirin: Secondary | ICD-10-CM | POA: Insufficient documentation

## 2015-09-15 DIAGNOSIS — W1839XA Other fall on same level, initial encounter: Secondary | ICD-10-CM | POA: Diagnosis not present

## 2015-09-15 DIAGNOSIS — F17210 Nicotine dependence, cigarettes, uncomplicated: Secondary | ICD-10-CM | POA: Diagnosis not present

## 2015-09-15 DIAGNOSIS — Z590 Homelessness: Secondary | ICD-10-CM | POA: Insufficient documentation

## 2015-09-15 DIAGNOSIS — I10 Essential (primary) hypertension: Secondary | ICD-10-CM | POA: Insufficient documentation

## 2015-09-15 DIAGNOSIS — Y9289 Other specified places as the place of occurrence of the external cause: Secondary | ICD-10-CM | POA: Insufficient documentation

## 2015-09-15 DIAGNOSIS — Y998 Other external cause status: Secondary | ICD-10-CM | POA: Insufficient documentation

## 2015-09-15 DIAGNOSIS — R52 Pain, unspecified: Secondary | ICD-10-CM
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
## 2015-09-15 NOTE — ED Notes (Signed)
Pt requesting food

## 2015-09-15 NOTE — ED Notes (Signed)
Pt was using a walker outside a laundromat and lost his grip on his walker and fell, landing on his R elbow (states has hx of chronic weakness of R hand).  Was having difficulty getting himself up (which pt states is not unusual) so bystanders called 911.  C/o pain to R elbow.

## 2015-09-15 NOTE — Discharge Instructions (Signed)
Elbow Contusion °An elbow contusion is a deep bruise of the elbow. Contusions are the result of an injury that caused bleeding under the skin. The contusion may turn blue, purple, or yellow. Minor injuries will give you a painless contusion, but more severe contusions may stay painful and swollen for a few weeks.  °CAUSES  °An elbow contusion comes from a direct force to that area, such as falling on the elbow. °SYMPTOMS  °· Swelling and redness of the elbow. °· Bruising of the elbow area. °· Tenderness or soreness of the elbow. °DIAGNOSIS  °You will have a physical exam and will be asked about your history. You may need an X-ray of your elbow to look for a broken bone (fracture).  °TREATMENT  °A sling or splint may be needed to support your injury. Resting, elevating, and applying cold compresses to the elbow area are often the best treatments for an elbow contusion. Over-the-counter medicines may also be recommended for pain control. °HOME CARE INSTRUCTIONS  °· Put ice on the injured area. °¨ Put ice in a plastic bag. °¨ Place a towel between your skin and the bag. °¨ Leave the ice on for 15-20 minutes, 03-04 times a day. °· Only take over-the-counter or prescription medicines for pain, discomfort, or fever as directed by your caregiver. °· Rest your injured elbow until the pain and swelling are better. °· Elevate your elbow to reduce swelling. °· Apply a compression wrap as directed by your caregiver. This can help reduce swelling and motion. You may remove the wrap for sleeping, showers, and baths. If your fingers become numb, cold, or blue, take the wrap off and reapply it more loosely. °· Use your elbow only as directed by your caregiver. You may be asked to do range of motion exercises. Do them as directed. °· See your caregiver as directed. It is very important to keep all follow-up appointments in order to avoid any long-term problems with your elbow, including chronic pain or inability to move your elbow  normally. °SEEK IMMEDIATE MEDICAL CARE IF:  °· You have increased redness, swelling, or pain in your elbow. °· Your swelling or pain is not relieved with medicines. °· You have swelling of the hand and fingers. °· You are unable to move your fingers or wrist. °· You begin to lose feeling in your hand or fingers. °· Your fingers or hand become cold or blue. °MAKE SURE YOU:  °· Understand these instructions. °· Will watch your condition. °· Will get help right away if you are not doing well or get worse. °  °This information is not intended to replace advice given to you by your health care provider. Make sure you discuss any questions you have with your health care provider. °  °Document Released: 04/07/2006 Document Revised: 07/22/2011 Document Reviewed: 12/12/2014 °Elsevier Interactive Patient Education ©2016 Elsevier Inc. ° °

## 2015-09-18 ENCOUNTER — Encounter (HOSPITAL_COMMUNITY): Payer: Self-pay

## 2015-09-18 ENCOUNTER — Encounter (HOSPITAL_COMMUNITY): Payer: Self-pay | Admitting: Emergency Medicine

## 2015-09-18 ENCOUNTER — Emergency Department (HOSPITAL_COMMUNITY)
Admission: EM | Admit: 2015-09-18 | Discharge: 2015-09-19 | Disposition: A | Discharge status: Home / Self Care | Payer: Medicaid Other | Source: Home / Self Care | Attending: Emergency Medicine | Admitting: Emergency Medicine

## 2015-09-18 ENCOUNTER — Emergency Department (HOSPITAL_COMMUNITY)
Admission: EM | Admit: 2015-09-18 | Discharge: 2015-09-18 | Disposition: A | Discharge status: Home / Self Care | Payer: Medicaid Other | Source: Home / Self Care | Attending: Emergency Medicine | Admitting: Emergency Medicine

## 2015-09-18 DIAGNOSIS — Z5900 Homelessness unspecified: Secondary | ICD-10-CM

## 2015-09-18 DIAGNOSIS — Z9119 Patient's noncompliance with other medical treatment and regimen: Secondary | ICD-10-CM | POA: Insufficient documentation

## 2015-09-18 DIAGNOSIS — N3945 Continuous leakage: Secondary | ICD-10-CM | POA: Insufficient documentation

## 2015-09-18 DIAGNOSIS — Y9389 Activity, other specified: Secondary | ICD-10-CM

## 2015-09-18 DIAGNOSIS — Z8669 Personal history of other diseases of the nervous system and sense organs: Secondary | ICD-10-CM

## 2015-09-18 DIAGNOSIS — I10 Essential (primary) hypertension: Secondary | ICD-10-CM | POA: Diagnosis not present

## 2015-09-18 DIAGNOSIS — Y9289 Other specified places as the place of occurrence of the external cause: Secondary | ICD-10-CM | POA: Insufficient documentation

## 2015-09-18 DIAGNOSIS — Z7982 Long term (current) use of aspirin: Secondary | ICD-10-CM

## 2015-09-18 DIAGNOSIS — Y998 Other external cause status: Secondary | ICD-10-CM

## 2015-09-18 DIAGNOSIS — Z87828 Personal history of other (healed) physical injury and trauma: Secondary | ICD-10-CM | POA: Insufficient documentation

## 2015-09-18 DIAGNOSIS — R600 Localized edema: Secondary | ICD-10-CM | POA: Insufficient documentation

## 2015-09-18 DIAGNOSIS — F17210 Nicotine dependence, cigarettes, uncomplicated: Secondary | ICD-10-CM

## 2015-09-18 DIAGNOSIS — X58XXXA Exposure to other specified factors, initial encounter: Secondary | ICD-10-CM | POA: Diagnosis not present

## 2015-09-18 DIAGNOSIS — R5381 Other malaise: Secondary | ICD-10-CM | POA: Diagnosis present

## 2015-09-18 DIAGNOSIS — I890 Lymphedema, not elsewhere classified: Secondary | ICD-10-CM | POA: Diagnosis not present

## 2015-09-18 DIAGNOSIS — Z79899 Other long term (current) drug therapy: Secondary | ICD-10-CM

## 2015-09-18 DIAGNOSIS — W1839XA Other fall on same level, initial encounter: Secondary | ICD-10-CM | POA: Insufficient documentation

## 2015-09-18 DIAGNOSIS — Z9114 Patient's other noncompliance with medication regimen: Secondary | ICD-10-CM

## 2015-09-18 DIAGNOSIS — S8992XA Unspecified injury of left lower leg, initial encounter: Secondary | ICD-10-CM | POA: Diagnosis not present

## 2015-09-18 DIAGNOSIS — Z792 Long term (current) use of antibiotics: Secondary | ICD-10-CM | POA: Diagnosis not present

## 2015-09-18 LAB — BASIC METABOLIC PANEL
Anion gap: 10 (ref 5–15)
BUN: 17 mg/dL (ref 6–20)
CO2: 28 mmol/L (ref 22–32)
Calcium: 8.9 mg/dL (ref 8.9–10.3)
Chloride: 105 mmol/L (ref 101–111)
Creatinine, Ser: 1.07 mg/dL (ref 0.61–1.24)
GFR calc Af Amer: >60 mL/min (ref >60)
GFR calc non Af Amer: >60 mL/min (ref >60)
Glucose, Bld: 102 mg/dL — ABNORMAL HIGH (ref 65–99)
Potassium: 3.6 mmol/L (ref 3.5–5.1)
Sodium: 143 mmol/L (ref 135–145)

## 2015-09-18 LAB — CBC WITH DIFFERENTIAL/PLATELET
Basophils Absolute: 0 10*3/uL (ref 0.0–0.1)
Basophils Relative: 0 %
Eosinophils Absolute: 0 10*3/uL (ref 0.0–0.7)
Eosinophils Relative: 0 %
HCT: 39 % (ref 39.0–52.0)
Hemoglobin: 13.7 g/dL (ref 13.0–17.0)
Lymphocytes Relative: 14 %
Lymphs Abs: 1 10*3/uL (ref 0.7–4.0)
MCH: 29.7 pg (ref 26.0–34.0)
MCHC: 35.1 g/dL (ref 30.0–36.0)
MCV: 84.4 fL (ref 78.0–100.0)
Monocytes Absolute: 0.5 10*3/uL (ref 0.1–1.0)
Monocytes Relative: 7 %
Neutro Abs: 5.6 10*3/uL (ref 1.7–7.7)
Neutrophils Relative %: 79 %
Platelets: 284 10*3/uL (ref 150–400)
RBC: 4.62 MIL/uL (ref 4.22–5.81)
RDW: 15 % (ref 11.5–15.5)
WBC: 7.2 10*3/uL (ref 4.0–10.5)

## 2015-09-18 MED ORDER — POTASSIUM CHLORIDE CRYS ER 20 MEQ PO TBCR
40.0000 meq | EXTENDED_RELEASE_TABLET | Freq: Once | ORAL | Status: AC
  Administered 2015-09-18: 40 meq via ORAL
  Filled 2015-09-18: qty 2

## 2015-09-18 MED ORDER — FUROSEMIDE 40 MG PO TABS
60.0000 mg | ORAL_TABLET | Freq: Once | ORAL | Status: AC
  Administered 2015-09-18: 60 mg via ORAL
  Filled 2015-09-18: qty 2

## 2015-09-18 NOTE — ED Notes (Signed)
PT DISCHARGED. INSTRUCTIONS GIVEN. AAOX3. PT IN NO APPARENT DISTRESS. THE OPPORTUNITY TO ASK QUESTIONS WAS PROVIDED. 

## 2015-09-18 NOTE — ED Notes (Signed)
ADULT DIAPER PLACED ON PT, AND AN EXTRA DIAPER GIVEN. PT WILL BE TAKEN TO THE BUS STOP BY SECURITY.
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
## 2015-09-18 NOTE — ED Notes (Signed)
Bed: WA19 Expected date:  Expected time:  Means of arrival:  Comments: 
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
## 2015-09-18 NOTE — ED Notes (Signed)
Pt was just discharged and waved [employer] down from the bus stop

## 2015-09-18 NOTE — Discharge Instructions (Signed)
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
## 2015-09-19 NOTE — ED Notes (Signed)
CN has notified ED providers that pt has been in the room for over 7.5 hours and has not been seen.
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
## 2015-09-19 NOTE — Progress Notes (Addendum)
[facility]  EDSW consulted by charge RN.  EDCM provided phone number to Medicaid transport, EDRN called Medicaid transport.  [facility], she is no longer there.  ED[facility] who reports patient usually stays at a 24 hour laundry mat.  She reports [name] in getting [facility] into an ALF.  [facility]  No further EDCM needs at this time.

## 2015-09-19 NOTE — ED Notes (Signed)
[name], SW, made arrangements [facility], pt understands that he is to take care of his on ADL's and that he has to walk at this facility.

## 2015-09-19 NOTE — Discharge Instructions (Signed)
Urinary Incontinence Urinary incontinence is the involuntary loss of urine from your bladder. CAUSES  There are many causes of urinary incontinence. They include:  Medicines.  Infections.  Prostatic enlargement, leading to overflow of urine from your bladder.  Surgery.  Neurological diseases.  Emotional factors. SIGNS AND SYMPTOMS Urinary Incontinence can be divided into four types: 1. Urge incontinence. Urge incontinence is the involuntary loss of urine before you have the opportunity to go to the bathroom. There is a sudden urge to void but not enough time to reach a bathroom. 2. Stress incontinence. Stress incontinence is the sudden loss of urine with any activity that forces urine to pass. It is commonly caused by anatomical changes to the pelvis and sphincter areas of your body. 3. Overflow incontinence. Overflow incontinence is the loss of urine from an obstructed opening to your bladder. This results in a backup of urine and a resultant buildup of pressure within the bladder. When the pressure within the bladder exceeds the closing pressure of the sphincter, the urine overflows, which causes incontinence, similar to water overflowing a dam. 4. Total incontinence. Total incontinence is the loss of urine as a result of the inability to store urine within your bladder. DIAGNOSIS  Evaluating the cause of incontinence may require:  A thorough and complete medical and obstetric history.  A complete physical exam.  Laboratory tests such as a urine culture and sensitivities. When additional tests are indicated, they can include:  An ultrasound exam.  Kidney and bladder X-rays.  Cystoscopy. This is an exam of the bladder using a narrow scope.  Urodynamic testing to test the nerve function to the bladder and sphincter areas. TREATMENT  Treatment for urinary incontinence depends on the cause:  For urge incontinence caused by a bacterial infection, antibiotics will be prescribed.  If the urge incontinence is related to medicines you take, your health care provider may have you change the medicine.  For stress incontinence, surgery to re-establish anatomical support to the bladder or sphincter, or both, will often correct the condition.  For overflow incontinence caused by an enlarged prostate, an operation to open the channel through the enlarged prostate will allow the flow of urine out of the bladder. In women with fibroids, a hysterectomy may be recommended.  For total incontinence, surgery on your urinary sphincter may help. An artificial urinary sphincter (an inflatable cuff placed around the urethra) may be required. In women who have developed a hole-like passage between their bladder and vagina (vesicovaginal fistula), surgery to close the fistula often is required. HOME CARE INSTRUCTIONS  Normal daily hygiene and the use of pads or adult diapers that are changed regularly will help prevent odors and skin damage.  Avoid caffeine. It can overstimulate your bladder.  Use the bathroom regularly. Try about every 2-3 hours to go to the bathroom, even if you do not feel the need to do so. Take time to empty your bladder completely. After urinating, wait a minute. Then try to urinate again.  For causes involving nerve dysfunction, keep a log of the medicines you take and a journal of the times you go to the bathroom. SEEK MEDICAL CARE IF:  You experience worsening of pain instead of improvement in pain after your procedure.  Your incontinence becomes worse instead of better. SEE IMMEDIATE MEDICAL CARE IF:  You experience fever or shaking chills.  You are unable to pass your urine.  You have redness spreading into your groin or down into your thighs. MAKE SURE   YOU:   Understand these instructions.   Will watch your condition.  Will get help right away if you are not doing well or get worse.   This information is not intended to replace advice given to you  by your health care provider. Make sure you discuss any questions you have with your health care provider.   Document Released: 06/06/2004 Document Revised: 05/20/2014 Document Reviewed: 10/06/2012 Elsevier Interactive Patient Education 2016 Elsevier Inc.  

## 2015-09-19 NOTE — ED Notes (Signed)
Pt is still waiting to be assessed by an ED provider.

## 2015-09-19 NOTE — Progress Notes (Signed)
[name] received a telephone call from the Charge Nurse stating patient uses a walker and that he would need a cab upon discharge. She stated patient falls when he rides the bus. [name] spoke with Asst. Social Work Interior and spatial designerDirector who approved of the [employer] voucher. [name] called [employer] and provided the voucher to Merchandiser, retailthe Charge Nurse.   Elenore PaddyLaVonia Larue Lightner, LCSWA [phone] ED [name] 09/19/2015 3:33 PM

## 2015-09-19 NOTE — ED Notes (Signed)
Upon getting into the police car the patient states that he needs to clean himself up, he was taken into the decon room where he started to argue with [employer] and refused to stand up and said he couldn't do anything by himself, at this time they decided to have him arrested and called [facility] to come get him to assist them in taking him to jail.

## 2015-09-20 ENCOUNTER — Emergency Department (HOSPITAL_COMMUNITY)
Admission: EM | Admit: 2015-09-20 | Discharge: 2015-09-20 | Disposition: A | Discharge status: Home / Self Care | Payer: No Typology Code available for payment source | Attending: Emergency Medicine | Admitting: Emergency Medicine

## 2015-09-20 ENCOUNTER — Emergency Department (HOSPITAL_COMMUNITY)
Admission: EM | Admit: 2015-09-20 | Discharge: 2015-09-21 | Disposition: A | Discharge status: Home / Self Care | Payer: Medicaid Other | Source: Home / Self Care | Attending: Emergency Medicine | Admitting: Emergency Medicine

## 2015-09-20 ENCOUNTER — Encounter (HOSPITAL_COMMUNITY): Payer: Self-pay | Admitting: Emergency Medicine

## 2015-09-20 ENCOUNTER — Encounter (HOSPITAL_COMMUNITY): Payer: Self-pay | Admitting: *Deleted

## 2015-09-20 DIAGNOSIS — Z7982 Long term (current) use of aspirin: Secondary | ICD-10-CM | POA: Insufficient documentation

## 2015-09-20 DIAGNOSIS — Y9289 Other specified places as the place of occurrence of the external cause: Secondary | ICD-10-CM | POA: Insufficient documentation

## 2015-09-20 DIAGNOSIS — I10 Essential (primary) hypertension: Secondary | ICD-10-CM | POA: Insufficient documentation

## 2015-09-20 DIAGNOSIS — Y92521 Bus station as the place of occurrence of the external cause: Secondary | ICD-10-CM

## 2015-09-20 DIAGNOSIS — Y9389 Activity, other specified: Secondary | ICD-10-CM | POA: Insufficient documentation

## 2015-09-20 DIAGNOSIS — T679XXA Effect of heat and light, unspecified, initial encounter: Secondary | ICD-10-CM

## 2015-09-20 DIAGNOSIS — S8992XA Unspecified injury of left lower leg, initial encounter: Secondary | ICD-10-CM

## 2015-09-20 DIAGNOSIS — I890 Lymphedema, not elsewhere classified: Secondary | ICD-10-CM | POA: Insufficient documentation

## 2015-09-20 DIAGNOSIS — W010XXA Fall on same level from slipping, tripping and stumbling without subsequent striking against object, initial encounter: Secondary | ICD-10-CM

## 2015-09-20 DIAGNOSIS — Z8669 Personal history of other diseases of the nervous system and sense organs: Secondary | ICD-10-CM | POA: Insufficient documentation

## 2015-09-20 DIAGNOSIS — F17210 Nicotine dependence, cigarettes, uncomplicated: Secondary | ICD-10-CM

## 2015-09-20 DIAGNOSIS — Z87828 Personal history of other (healed) physical injury and trauma: Secondary | ICD-10-CM | POA: Insufficient documentation

## 2015-09-20 DIAGNOSIS — R609 Edema, unspecified: Secondary | ICD-10-CM

## 2015-09-20 DIAGNOSIS — Y998 Other external cause status: Secondary | ICD-10-CM | POA: Insufficient documentation

## 2015-09-20 DIAGNOSIS — R600 Localized edema: Secondary | ICD-10-CM | POA: Insufficient documentation

## 2015-09-20 DIAGNOSIS — Z5900 Homelessness unspecified: Secondary | ICD-10-CM

## 2015-09-20 DIAGNOSIS — M79605 Pain in left leg: Secondary | ICD-10-CM

## 2015-09-20 DIAGNOSIS — Z79899 Other long term (current) drug therapy: Secondary | ICD-10-CM | POA: Insufficient documentation

## 2015-09-20 DIAGNOSIS — W19XXXA Unspecified fall, initial encounter: Secondary | ICD-10-CM

## 2015-09-20 DIAGNOSIS — X30XXXA Exposure to excessive natural heat, initial encounter: Secondary | ICD-10-CM | POA: Insufficient documentation

## 2015-09-20 NOTE — Discharge Instructions (Signed)
Peripheral Edema You have swelling in your legs (peripheral edema). This swelling is due to excess accumulation of salt and water in your body. Edema may be a sign of heart, kidney or liver disease, or a side effect of a medication. It may also be due to problems in the leg veins. Elevating your legs and using special support stockings may be very helpful, if the cause of the swelling is due to poor venous circulation. Avoid long periods of standing, whatever the cause. Treatment of edema depends on identifying the cause. Chips, pretzels, pickles and other salty foods should be avoided. Restricting salt in your diet is almost always needed. Water pills (diuretics) are often used to remove the excess salt and water from your body via urine. These medicines prevent the kidney from reabsorbing sodium. This increases urine flow. Diuretic treatment may also result in lowering of potassium levels in your body. Potassium supplements may be needed if you have to use diuretics daily. Daily weights can help you keep track of your progress in clearing your edema. You should call your caregiver for follow up care as recommended. SEEK IMMEDIATE MEDICAL CARE IF:   You have increased swelling, pain, redness, or heat in your legs.  You develop shortness of breath, especially when lying down.  You develop chest or abdominal pain, weakness, or fainting.  You have a fever.   This information is not intended to replace advice given to you by your health care provider. Make sure you discuss any questions you have with your health care provider.   Document Released: 06/06/2004 Document Revised: 07/22/2011 Document Reviewed: 11/09/2014 Elsevier Interactive Patient Education 2016 [employer].  [employer] Exhaustion Information WHAT IS HEAT EXHAUSTION? Heat exhaustion happens when your body gets overheated from hot weather or from exercise. Heat exhaustion makes the temperature of your skin and body go up. Your body cools  itself by sweating. If you do not drink enough water to replace what you sweat, you lose too much water and salt. This makes it harder for your body to produce more sweat. When you do not sweat enough, your body cannot cool down, and heat exhaustion may result. Heat exhaustion can lead to heatstroke, which is a more serious illness. WHO IS AT RISK FOR HEAT EXHAUSTION? Anyone can get heat exhaustion. However, heat exhaustion is more likely when you are exercising or doing a physical activity. It is also more likely when you are in hot and humid weather or bright sunshine. Heat exhaustion is also more likely to develop in:  People who are age 69 or older.  Children.  People who have a medical condition such as heart disease, poor circulation, sickle cell disease, or high blood pressure.  People who have a fever.  People who are very overweight (obese).  People who are dehydrated from:  Drinking alcohol or caffeine.  Taking certain medicines, such as diuretics or stimulants. WHAT ARE THE SYMPTOMS OF HEAT EXHAUSTION? Symptoms of heat exhaustion include:  A body temperature of up to 104F (40C).  Moist, cool, and clammy skin.  Dizziness.  Headache.  Nausea.  Fatigue.  Thirst.  Dark-colored urine.  Rapid pulse or heartbeat.  Weakness.  Muscle cramps.  Confusion.  Fainting. WHAT SHOULD I DO IF I THINK I HAVE HEAT EXHAUSTION? If you think that you have heat exhaustion, call your health care provider. Follow his or her instructions. You should also:  Call a friend or a family member and ask someone to stay with you.  Move to a cooler location, such as:  Into the shade.  In front of a fan.  Someplace that has air conditioning.  Lie down and rest.  Slowly drink nonalcoholic, caffeine-free fluids.  Take off any extra clothing or tight-fitting clothes.  Take a cool bath or shower, if possible. If you do not have access to a bath or shower, dab or mist cool  water on your skin. WHY IS IT IMPORTANT TO TREAT HEAT EXHAUSTION? It is extremely important to take care of yourself and treat heat exhaustion as soon as possible. Untreated heat exhaustion can turn into heatstroke. Symptoms of heatstroke include:  A body temperature of 104F (40C) or higher.  Hot, red skin that may be dry or moist.  Severe headache.  Nausea and vomiting.  Muscle weakness and cramping.  Confusion.  Rapid breathing.  Fainting.  Seizure. These symptoms may represent a serious problem that is an emergency. Do not wait to see if the symptoms will go away. Get medical help right away. Call your local emergency services (911 in the [place] Do not drive yourself to the hospital. Heatstroke is a life-threatening condition that requires urgent medical treatment. Do not treat heatstroke at home. Heatstroke should be treated by a health care professional and may require hospitalization. At the hospital, you may need to receive fluids through an IV tube:  If you cannot drink any fluids.  If you vomit any fluids that you drink.  If your symptoms do not get better after one hour.  If your symptoms get worse after one hour. HOW CAN I PREVENT HEAT EXHAUSTION?  Avoid outdoor activities on very hot or humid days.  Do not exercise or do other physical activity when you are not feeling well.  Drink plenty of nonalcoholic and caffeine-free fluids before and during physical activity.  Take frequent breaks for rest during physical activity.  Wear light-colored, loose-fitting, and lightweight clothing in the heat.  Wear a hat and use sunscreen when exercising outdoors.  Avoid being outside during the hottest times of the day.  Check with your health care provider before you start any new activity, especially if you take medicine or have a medical condition.  Start any new activity slowly and work up to your fitness level. HOW CAN I HELP TO PROTECT ELDERLY RELATIVES AND  NEIGHBORS FROM HEAT EXHAUSTION? People who are age 50 or older are at greater risk for heat exhaustion. Their bodies have a harder time adjusting to heat. They are also more likely to have a medical condition or be on medicines that increase their risk for heat exhaustion. They may get heat exhaustion indoors if the heat is high for several days. You can help to protect them during hot weather by:  Checking on them two or more times each day.  Making sure that they are drinking plenty of cool, nonalcoholic, and caffeine-free fluids.  Making sure that they use their air conditioner.  Taking them to a location where air conditioning is available.  Talking with their health care provider about their medical needs, medicines, and fluid requirements.   This information is not intended to replace advice given to you by your health care provider. Make sure you discuss any questions you have with your health care provider.   Document Released: 02/06/2008 Document Revised: 01/18/2015 Document Reviewed: 04/06/2014 Elsevier Interactive Patient Education [employer]2016 Elsevier Inc.
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
## 2015-09-20 NOTE — ED Notes (Signed)
Pt states "[facility] me in I could not get comfortable"
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
## 2015-09-20 NOTE — ED Notes (Addendum)
Pt urinated on himself while in the hallway, pt had a urinal beside him, and refused to clean himself off or attempt to move. RN notified
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
## 2015-09-21 ENCOUNTER — Encounter (HOSPITAL_COMMUNITY): Payer: Self-pay | Admitting: Emergency Medicine

## 2015-09-21 ENCOUNTER — Emergency Department (HOSPITAL_COMMUNITY)
Admission: EM | Admit: 2015-09-21 | Discharge: 2015-09-21 | Disposition: A | Discharge status: Home / Self Care | Payer: Medicaid Other | Attending: Emergency Medicine | Admitting: Emergency Medicine

## 2015-09-21 DIAGNOSIS — Z79899 Other long term (current) drug therapy: Secondary | ICD-10-CM | POA: Insufficient documentation

## 2015-09-21 DIAGNOSIS — Z8669 Personal history of other diseases of the nervous system and sense organs: Secondary | ICD-10-CM | POA: Insufficient documentation

## 2015-09-21 DIAGNOSIS — I890 Lymphedema, not elsewhere classified: Secondary | ICD-10-CM | POA: Insufficient documentation

## 2015-09-21 DIAGNOSIS — Z792 Long term (current) use of antibiotics: Secondary | ICD-10-CM | POA: Insufficient documentation

## 2015-09-21 DIAGNOSIS — S8992XA Unspecified injury of left lower leg, initial encounter: Secondary | ICD-10-CM | POA: Insufficient documentation

## 2015-09-21 DIAGNOSIS — Z5900 Homelessness unspecified: Secondary | ICD-10-CM

## 2015-09-21 DIAGNOSIS — Z7982 Long term (current) use of aspirin: Secondary | ICD-10-CM | POA: Insufficient documentation

## 2015-09-21 DIAGNOSIS — F17210 Nicotine dependence, cigarettes, uncomplicated: Secondary | ICD-10-CM | POA: Insufficient documentation

## 2015-09-21 DIAGNOSIS — I10 Essential (primary) hypertension: Secondary | ICD-10-CM | POA: Insufficient documentation

## 2015-09-21 DIAGNOSIS — X58XXXA Exposure to other specified factors, initial encounter: Secondary | ICD-10-CM | POA: Insufficient documentation

## 2015-09-21 NOTE — ED Notes (Signed)
Patient here via EMS, reports continence of urine and stool. To be seen by APS.
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
## 2015-09-21 NOTE — Progress Notes (Signed)
[name] met with patient at bedside. Patient confirms that he present to [facility] due to being hot while in line at the [facility]. Per note, pt presents to [facility] due to c/o of being overheated due to sitting in direct sunlight with his jacket on.  [name] asked pt would he be interested in community resources regarding food and shelter. Patient declined. Patient states that he is not interested in shelter information. Patient states he is interested in finding housing. [name] informed pt that permanent housing is not found from [facility].  Willette Brace [phone] ED [name] 09/21/2015

## 2015-09-21 NOTE — Discharge Instructions (Signed)
Followup with your doctor as needed

## 2015-09-23 ENCOUNTER — Emergency Department (HOSPITAL_COMMUNITY): Payer: Medicaid Other

## 2015-09-23 ENCOUNTER — Emergency Department (HOSPITAL_COMMUNITY)
Admission: EM | Admit: 2015-09-23 | Discharge: 2015-09-23 | Disposition: A | Discharge status: Home / Self Care | Payer: Medicaid Other | Attending: Emergency Medicine | Admitting: Emergency Medicine

## 2015-09-23 ENCOUNTER — Encounter (HOSPITAL_COMMUNITY): Payer: Self-pay | Admitting: Adult Health

## 2015-09-23 DIAGNOSIS — I10 Essential (primary) hypertension: Secondary | ICD-10-CM | POA: Diagnosis not present

## 2015-09-23 DIAGNOSIS — T185XXA Foreign body in anus and rectum, initial encounter: Secondary | ICD-10-CM | POA: Diagnosis not present

## 2015-09-23 DIAGNOSIS — K6289 Other specified diseases of anus and rectum: Secondary | ICD-10-CM

## 2015-09-23 DIAGNOSIS — R600 Localized edema: Secondary | ICD-10-CM | POA: Insufficient documentation

## 2015-09-23 DIAGNOSIS — Y9289 Other specified places as the place of occurrence of the external cause: Secondary | ICD-10-CM | POA: Diagnosis not present

## 2015-09-23 DIAGNOSIS — R5383 Other fatigue: Secondary | ICD-10-CM | POA: Insufficient documentation

## 2015-09-23 DIAGNOSIS — Z87828 Personal history of other (healed) physical injury and trauma: Secondary | ICD-10-CM | POA: Insufficient documentation

## 2015-09-23 DIAGNOSIS — Y9389 Activity, other specified: Secondary | ICD-10-CM | POA: Diagnosis not present

## 2015-09-23 DIAGNOSIS — Z79899 Other long term (current) drug therapy: Secondary | ICD-10-CM | POA: Diagnosis not present

## 2015-09-23 DIAGNOSIS — Z8669 Personal history of other diseases of the nervous system and sense organs: Secondary | ICD-10-CM | POA: Insufficient documentation

## 2015-09-23 DIAGNOSIS — Z5900 Homelessness unspecified: Secondary | ICD-10-CM

## 2015-09-23 DIAGNOSIS — I890 Lymphedema, not elsewhere classified: Secondary | ICD-10-CM | POA: Diagnosis not present

## 2015-09-23 DIAGNOSIS — W1839XA Other fall on same level, initial encounter: Secondary | ICD-10-CM | POA: Insufficient documentation

## 2015-09-23 DIAGNOSIS — Y998 Other external cause status: Secondary | ICD-10-CM | POA: Insufficient documentation

## 2015-09-23 DIAGNOSIS — F17210 Nicotine dependence, cigarettes, uncomplicated: Secondary | ICD-10-CM | POA: Diagnosis not present

## 2015-09-23 DIAGNOSIS — Z7982 Long term (current) use of aspirin: Secondary | ICD-10-CM | POA: Insufficient documentation

## 2015-09-23 DIAGNOSIS — K5900 Constipation, unspecified: Secondary | ICD-10-CM | POA: Insufficient documentation

## 2015-09-23 LAB — I-STAT CHEM 8, ED
BUN: 12 mg/dL (ref 6–20)
CALCIUM ION: 1.09 mmol/L — AB (ref 1.12–1.23)
CHLORIDE: 103 mmol/L (ref 101–111)
Creatinine, Ser: 0.7 mg/dL (ref 0.61–1.24)
Glucose, Bld: 55 mg/dL — ABNORMAL LOW (ref 65–99)
HCT: 47 % (ref 39.0–52.0)
Hemoglobin: 16 g/dL (ref 13.0–17.0)
POTASSIUM: 3.2 mmol/L — AB (ref 3.5–5.1)
Sodium: 144 mmol/L (ref 135–145)
TCO2: 28 mmol/L (ref 0–100)

## 2015-09-23 MED ORDER — POTASSIUM CHLORIDE CRYS ER 20 MEQ PO TBCR
40.0000 meq | EXTENDED_RELEASE_TABLET | Freq: Once | ORAL | Status: AC
  Administered 2015-09-23: 40 meq via ORAL
  Filled 2015-09-23: qty 2

## 2015-09-23 NOTE — Discharge Instructions (Signed)
There were no signs of foreign body or bleeding from your rectum on your exam or x-ray imaging studies today. Avoid inserting sharp objects such as sticks into your rectum in the future.  [facility] The United Ways 211 is a great source of information about community services available.  Access by dialing 2-1-1 from anywhere in [place], or by website -  PooledIncome.pl.   Other Local Resources (Updated 05/2015)  Shelters  Services   Phone Number and Address  [facility]  Housing for homeless and needy men with substance abuse issues [phone] [address] [place], [place]  [employer] [facility] [place]  Emergency assistance  [employer]  Pantry services [phone] [place], [place]  [facility]  Domestic violence shelter for women and their children [phone] [place], [place]  [facility]  Domestic violence shelter for women and their children [phone] [place], [place]  [facility])     [facility] coordinates access to most shelters in [place]  Apply in person Monday - Friday, 10 am - 4 pm.    After hours/ weekends, contact individual shelters directly [phone] [address] [place], [place]  [facility] [employer]  Food  Emergency financial assistance  Permanent supportive housing [phone] [address] [place], [place]   [employer]   Crisis assistance  Medication  Housing  Food  Utility assistance [phone] [address] [place], [place]  [employer]    Crisis assistance  Medication  Housing  Food  Utility assistance [phone] [address], [place], [place]  [employer] of [place]     Transitional housing  Case Proofreader assistance [phone] [address] [place], [place]   Lysle Morales, [employer] for adult men and women [phone] [address] [place], [place]  24-hour Crisis Line for those Facing Homelessness    [phone]   [employer] Guide Social Services The United Ways 211 is a great source of information about community services available.  Access by dialing 2-1-1 from anywhere in [place], or by website -  PooledIncome.pl.   Other Local Resources (Updated 05/2015)  Social  Contractor Number and Address  Aging, Disability and Transit Services  In-home services  Meals on wheels  Community meal sites  Transportation services  Adult day health care  Center for Active Retirement  Family caregiver support services [phone] [place], [place]  [place] [employer] of Social Services  Aging and adult services  Childrens services  Deaf-Blind services  Disability services  Guardianship  Hearing loss (assistive technology, interpreters, etc.)  Low-income services (health care, child care, housing, financial and nutrition assistance)  Medicaid  Pregnancy services  Utility assistance  Veterans services [phone] [address] [place], [place] [address]   [facility]  Assessment  Care management  Family education  Information and referral [phone] [address] [place], [place] [address]  [facility] Social Services  Aging and adult services  Childrens services  Deaf-Blind services  Disability services  Guardianship  Hearing loss (assistive technology, interpreters, etc.)  Low-income services (health care, child care, housing, financial and nutrition assistance)  [facility]  Pregnancy services  Utility assistance  Veterans services [phone] [address] [place], [place] [address]  [facility] Department of Social Services  Aging and adult  services  Childrens  services  Deaf-Blind services  Disability services  Guardianship  Hearing loss (assistive technology, interpreters, etc.)  Low-income services (health care, child care, housing, financial and nutrition assistance)  [facility]  Pregnancy services  Utility assistance  Veterans services [phone] [address] [place], [place] [address]  [facility] Department of Health and [employer] Services  Aging and adult services  Childrens services  Deaf-Blind services  Disability services  Guardianship  Hearing loss (assistive technology, interpreters, etc.)  Low-income services (health care, child care, housing, financial and nutrition assistance)  Medicaid  Pregnancy services  Utility assistance  Veterans services [phone] [address] [place], [place] [address]   [facility] Department of Social Services  Aging and adult services  Childrens services  Deaf-Blind services  Disability services  Guardianship  Hearing loss (assistive technology, interpreters, etc.)  Low-income services (health care, child care, housing, financial and nutrition assistance)  Medicaid  Pregnancy services  Utility assistance  Veterans services [phone] [address], [place], [place] [address]  [facility] Division of Social Services  Aging and adult services  Childrens services  Deaf-Blind services  Disability services  Guardianship  Hearing loss (assistive technology, interpreters, etc.)  Low-income services (health care, child care, housing, financial and nutrition assistance)  Medicaid  Pregnancy services  Utility assistance  Veterans services [phone] [address] [place], [place] [address]  [facility] of [place]  Serves adults age 11060 and over and their families  Caregiver information  Foster grandparents  Forensic scientistGrandparents Raising Grandchildren  Mobile meals  Refugee programs  Retired Restaurant manager, fast food& Senior Volunteer  Program (RSVP)  Customer service managerural Outreach  Senior Center  Seniors Health Insurance Information Program [facility](SHIIP)  Administrator, Civil Serviceenior Wheels Medical Transportation Program  SeniorLine  Support Groups  TeleCare [phone]  [address] [place], [place] [address]  [phone]  [address] [place], [place] [address]  Senior [employer]  Help Line  Home Care  Living at Goldman SachsHome  Meals on Wheels  Senior Lunch Program  Adult Day Center  Equipment [facility]et  Support Groups  Care Partner Workshops  Referrals for chore and homemaker services [phone]  [address] [place], [place] [address]  [facility] [employer]  Crisis assistance  Medication assistance  Rental assistance  Food pantry  Medication assistance  Housing assistance  Emergency food distribution  Utility assistance  Boys and Girls Clubs [phone] [address] [place], [place]   [phone](Tuesdays and Thursdays from 9am - 12 noon by appointment only) [address] [place], [place] [address]  [phone] [address] [place], [place] [address]
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
## 2015-09-23 NOTE — ED Notes (Signed)
PResents with complaints of rectral pina-pt believes he may have landed on a stick that he was using to wipe his rectum. No rectal trauma noted, no bleeding. He is alert and oriented.

## 2015-09-24 ENCOUNTER — Encounter (HOSPITAL_COMMUNITY): Payer: Self-pay | Admitting: Emergency Medicine

## 2015-09-24 ENCOUNTER — Emergency Department (HOSPITAL_COMMUNITY)
Admission: EM | Admit: 2015-09-24 | Discharge: 2015-09-24 | Disposition: A | Discharge status: Home / Self Care | Payer: No Typology Code available for payment source | Attending: Emergency Medicine | Admitting: Emergency Medicine

## 2015-09-24 DIAGNOSIS — I10 Essential (primary) hypertension: Secondary | ICD-10-CM | POA: Insufficient documentation

## 2015-09-24 DIAGNOSIS — Z7982 Long term (current) use of aspirin: Secondary | ICD-10-CM | POA: Insufficient documentation

## 2015-09-24 DIAGNOSIS — Z5900 Homelessness unspecified: Secondary | ICD-10-CM

## 2015-09-24 DIAGNOSIS — F17210 Nicotine dependence, cigarettes, uncomplicated: Secondary | ICD-10-CM | POA: Insufficient documentation

## 2015-09-24 DIAGNOSIS — Z79899 Other long term (current) drug therapy: Secondary | ICD-10-CM | POA: Insufficient documentation

## 2015-09-24 DIAGNOSIS — R5383 Other fatigue: Secondary | ICD-10-CM | POA: Insufficient documentation

## 2015-09-24 NOTE — Discharge Instructions (Signed)
Follow up with your primary care doctor.
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
## 2015-09-24 NOTE — ED Notes (Signed)
[facility]. Pt states he sat in the sun and is now feeling tired. Pt is alert and oriented x 4. Pt was recently seen for the same.

## 2015-09-25 ENCOUNTER — Emergency Department (HOSPITAL_COMMUNITY)
Admission: EM | Admit: 2015-09-25 | Discharge: 2015-09-25 | Disposition: A | Discharge status: Home / Self Care | Payer: Medicaid Other | Attending: Emergency Medicine | Admitting: Emergency Medicine

## 2015-09-25 ENCOUNTER — Encounter (HOSPITAL_COMMUNITY): Payer: Self-pay

## 2015-09-25 ENCOUNTER — Telehealth: Payer: Self-pay | Admitting: Family Medicine

## 2015-09-25 DIAGNOSIS — Z5900 Homelessness unspecified: Secondary | ICD-10-CM

## 2015-09-25 DIAGNOSIS — Z5321 Procedure and treatment not carried out due to patient leaving prior to being seen by health care provider: Secondary | ICD-10-CM | POA: Diagnosis not present

## 2015-09-25 DIAGNOSIS — R262 Difficulty in walking, not elsewhere classified: Secondary | ICD-10-CM | POA: Diagnosis present

## 2015-09-25 DIAGNOSIS — Z7982 Long term (current) use of aspirin: Secondary | ICD-10-CM | POA: Insufficient documentation

## 2015-09-25 DIAGNOSIS — F17210 Nicotine dependence, cigarettes, uncomplicated: Secondary | ICD-10-CM | POA: Diagnosis not present

## 2015-09-25 DIAGNOSIS — I10 Essential (primary) hypertension: Secondary | ICD-10-CM | POA: Insufficient documentation

## 2015-09-25 NOTE — Discharge Instructions (Signed)
Community Resource Guide Shelters °The United Way’s “211” is a great source of information about community services available.  Access by dialing 2-1-1 from anywhere in [place], or by website -  www.nc211.org.  ° °Other Local Resources (Updated 05/2015) ° °Shelters  °Services   °Phone Number and Address  °[facility] • Housing for homeless and needy men with substance abuse issues [phone] °[address] [place], [place]  °[facility] • Emergency assistance °• Shelter °• Meals °• Pantry services [phone] °[place], [place]  °[facility] • Domestic violence shelter for women and their children 336-387-6161 °[place], [place]  °[facility] • Domestic violence shelter for women and their children [phone] °[place], [place]  °[facility]   ° • The [facility] coordinates access to most shelters in [place] °• Apply in person Monday - Friday, 10 am - 4 pm.   °• After hours/ weekends, contact individual shelters directly [phone] °[address] [place], [place]  °[facility] [place] [facility]  • Housing °• Food °• Emergency financial assistance °• Permanent supportive housing [phone] °[address] [place], [place]   °[facility]  • Crisis assistance °• Medication °• Housing °• Food °• Utility assistance 336-226-4462 °[address] , [place]  °[facility]  ° • Crisis assistance °• Medication °• Housing °• Food °• Utility assistance 336-349-4923 °[address], [place], [place]  °[facility] ° ° ° • Transitional housing °• Case management °• Utility assistance °• Food °• Clothing °• Transportation assistance 336-273-5572 °[address] [place],   °[facility] • Shelter for adult men and women [phone] °[address] °[place],   °24-hour Crisis Line for those Facing Homelessness     [phone]  ° °
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
## 2015-09-25 NOTE — Telephone Encounter (Signed)
Worker is requesting [place] 2 form for placement....Marland Kitchen.Marland Kitchen.please follow up with case worker

## 2015-09-26 ENCOUNTER — Encounter (HOSPITAL_COMMUNITY): Payer: Self-pay | Admitting: Emergency Medicine

## 2015-09-26 ENCOUNTER — Emergency Department (HOSPITAL_COMMUNITY)
Admission: EM | Admit: 2015-09-26 | Discharge: 2015-09-26 | Disposition: A | Discharge status: Home / Self Care | Payer: Medicaid Other | Attending: Emergency Medicine | Admitting: Emergency Medicine

## 2015-09-26 DIAGNOSIS — Z5900 Homelessness unspecified: Secondary | ICD-10-CM

## 2015-09-26 DIAGNOSIS — Z79899 Other long term (current) drug therapy: Secondary | ICD-10-CM | POA: Diagnosis not present

## 2015-09-26 DIAGNOSIS — R000 Tachycardia, unspecified: Secondary | ICD-10-CM | POA: Diagnosis present

## 2015-09-26 DIAGNOSIS — I10 Essential (primary) hypertension: Secondary | ICD-10-CM | POA: Diagnosis not present

## 2015-09-26 DIAGNOSIS — Z7982 Long term (current) use of aspirin: Secondary | ICD-10-CM | POA: Diagnosis not present

## 2015-09-26 DIAGNOSIS — F17210 Nicotine dependence, cigarettes, uncomplicated: Secondary | ICD-10-CM | POA: Insufficient documentation

## 2015-09-26 DIAGNOSIS — I890 Lymphedema, not elsewhere classified: Secondary | ICD-10-CM | POA: Diagnosis not present
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
## 2015-09-26 NOTE — Telephone Encounter (Signed)
Patients Caseworker dropped off FL2 Form to be filled by doctor.

## 2015-09-26 NOTE — ED Notes (Signed)
Pt presents with c/o heat exposure. Pt has been sitting in the sun all day, temperature of 99.4 fpr EMS. Pt refused any treatment from EMS.

## 2015-09-27 NOTE — Telephone Encounter (Signed)
Lanae pt. Social Worker came to the facility requesting to speak with the nurse regarding the FL2 form that has to be filled out by pt. PCP. Please f/u ASAP.

## 2015-09-28 NOTE — Telephone Encounter (Signed)
This Case Manager placed call to [name], SW [phone]) to discuss needed FL2 form; unable to reach. Voicemail left requesting return call.

## 2015-09-29 ENCOUNTER — Emergency Department (HOSPITAL_COMMUNITY)
Admission: EM | Admit: 2015-09-29 | Discharge: 2015-09-29 | Disposition: A | Discharge status: Home / Self Care | Payer: Medicaid Other | Attending: Emergency Medicine | Admitting: Emergency Medicine

## 2015-09-29 ENCOUNTER — Encounter (HOSPITAL_COMMUNITY): Payer: Self-pay

## 2015-09-29 DIAGNOSIS — T675XXA Heat exhaustion, unspecified, initial encounter: Secondary | ICD-10-CM | POA: Insufficient documentation

## 2015-09-29 DIAGNOSIS — R531 Weakness: Secondary | ICD-10-CM | POA: Insufficient documentation

## 2015-09-29 DIAGNOSIS — F17210 Nicotine dependence, cigarettes, uncomplicated: Secondary | ICD-10-CM | POA: Insufficient documentation

## 2015-09-29 DIAGNOSIS — Y9289 Other specified places as the place of occurrence of the external cause: Secondary | ICD-10-CM | POA: Diagnosis not present

## 2015-09-29 DIAGNOSIS — Z590 Homelessness: Secondary | ICD-10-CM | POA: Diagnosis not present

## 2015-09-29 DIAGNOSIS — I10 Essential (primary) hypertension: Secondary | ICD-10-CM | POA: Diagnosis not present

## 2015-09-29 DIAGNOSIS — Z79899 Other long term (current) drug therapy: Secondary | ICD-10-CM | POA: Diagnosis not present

## 2015-09-29 DIAGNOSIS — R600 Localized edema: Secondary | ICD-10-CM | POA: Insufficient documentation

## 2015-09-29 DIAGNOSIS — Z87828 Personal history of other (healed) physical injury and trauma: Secondary | ICD-10-CM | POA: Diagnosis not present

## 2015-09-29 DIAGNOSIS — Y9389 Activity, other specified: Secondary | ICD-10-CM | POA: Diagnosis not present

## 2015-09-29 DIAGNOSIS — Z8669 Personal history of other diseases of the nervous system and sense organs: Secondary | ICD-10-CM | POA: Insufficient documentation

## 2015-09-29 DIAGNOSIS — Y998 Other external cause status: Secondary | ICD-10-CM | POA: Diagnosis not present

## 2015-09-29 DIAGNOSIS — Z7982 Long term (current) use of aspirin: Secondary | ICD-10-CM | POA: Insufficient documentation

## 2015-09-29 LAB — BASIC METABOLIC PANEL
ANION GAP: 11 (ref 5–15)
BUN: 7 mg/dL (ref 6–20)
CALCIUM: 9.2 mg/dL (ref 8.9–10.3)
CO2: 29 mmol/L (ref 22–32)
Chloride: 104 mmol/L (ref 101–111)
Creatinine, Ser: 0.95 mg/dL (ref 0.61–1.24)
GLUCOSE: 100 mg/dL — AB (ref 65–99)
Potassium: 3.5 mmol/L (ref 3.5–5.1)
SODIUM: 144 mmol/L (ref 135–145)

## 2015-09-29 LAB — CBC
HCT: 38.5 % — ABNORMAL LOW (ref 39.0–52.0)
HEMOGLOBIN: 12.9 g/dL — AB (ref 13.0–17.0)
MCH: 28.9 pg (ref 26.0–34.0)
MCHC: 33.5 g/dL (ref 30.0–36.0)
MCV: 86.3 fL (ref 78.0–100.0)
Platelets: 283 10*3/uL (ref 150–400)
RBC: 4.46 MIL/uL (ref 4.22–5.81)
RDW: 14.8 % (ref 11.5–15.5)
WBC: 5.5 10*3/uL (ref 4.0–10.5)

## 2015-09-29 LAB — CBG MONITORING, ED: GLUCOSE-CAPILLARY: 68 mg/dL (ref 65–99)

## 2015-09-29 NOTE — ED Notes (Signed)
Pt from [facility], sent to triage upon arrival. Pt is homeless and he reports he has been sitting in the sun all day and is very weak, has not eaten or drank much today. A&ox4.

## 2015-09-29 NOTE — ED Notes (Signed)
No answer in waiting room 
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
## 2015-09-29 NOTE — Telephone Encounter (Signed)
This Case Manager placed an additional call to Lebron ConnersLanae Anderson, SW (#[phone]); unable to reach. Voicemail left requesting return call.

## 2015-09-29 NOTE — ED Notes (Signed)
Gave pt sandwich meal & drink.

## 2015-09-29 NOTE — ED Notes (Signed)
Found pt sleeping in waiting room.

## 2015-09-29 NOTE — Discharge Instructions (Signed)
Please read and follow all provided instructions.  Your diagnoses today include:  1. Heat exhaustion, initial encounter     Tests performed today include:  Blood counts and electrolytes  Vital signs. See below for your results today.   Medications prescribed:   None  Take any prescribed medications only as directed.  Home care instructions:  Follow any educational materials contained in this packet.  Double your fluid intake over the next 2 days. Avoid long heat exposures.   Follow-up instructions: Please follow-up with your primary care provider in the next 2 days for further evaluation of your symptoms.   Return instructions:   Please return to the Emergency Department if you experience worsening symptoms.   Please return if you have any other emergent concerns.  Additional Information:  Your vital signs today were: BP 147/85 mmHg   Pulse 88   Temp(Src) 98.2 F (36.8 C) (Oral)   Resp 18   Ht 5\' 9"  (1.753 m)   Wt 136.079 kg   BMI 44.28 kg/m2   SpO2 100% If your blood pressure (BP) was elevated above 135/85 this visit, please have this repeated by your doctor within one month. --------------

## 2015-09-29 NOTE — ED Notes (Signed)
Pt given new blue paper scrubs to change into. Given bus pass. Departed in NAD.

## 2015-09-30 ENCOUNTER — Emergency Department (HOSPITAL_COMMUNITY)
Admission: EM | Admit: 2015-09-30 | Discharge: 2015-09-30 | Disposition: A | Discharge status: Home / Self Care | Payer: Medicaid Other | Attending: Emergency Medicine | Admitting: Emergency Medicine

## 2015-09-30 ENCOUNTER — Encounter (HOSPITAL_COMMUNITY): Payer: Self-pay | Admitting: *Deleted

## 2015-09-30 DIAGNOSIS — F17210 Nicotine dependence, cigarettes, uncomplicated: Secondary | ICD-10-CM | POA: Diagnosis not present

## 2015-09-30 DIAGNOSIS — X30XXXA Exposure to excessive natural heat, initial encounter: Secondary | ICD-10-CM | POA: Diagnosis not present

## 2015-09-30 DIAGNOSIS — R2243 Localized swelling, mass and lump, lower limb, bilateral: Secondary | ICD-10-CM | POA: Diagnosis not present

## 2015-09-30 DIAGNOSIS — Y939 Activity, unspecified: Secondary | ICD-10-CM | POA: Diagnosis not present

## 2015-09-30 DIAGNOSIS — Y929 Unspecified place or not applicable: Secondary | ICD-10-CM | POA: Diagnosis not present

## 2015-09-30 DIAGNOSIS — I10 Essential (primary) hypertension: Secondary | ICD-10-CM | POA: Diagnosis not present

## 2015-09-30 DIAGNOSIS — Y999 Unspecified external cause status: Secondary | ICD-10-CM | POA: Diagnosis not present

## 2015-09-30 DIAGNOSIS — Z5900 Homelessness unspecified: Secondary | ICD-10-CM

## 2015-09-30 DIAGNOSIS — R531 Weakness: Secondary | ICD-10-CM | POA: Diagnosis present

## 2015-09-30 DIAGNOSIS — Z7982 Long term (current) use of aspirin: Secondary | ICD-10-CM | POA: Diagnosis not present
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
## 2015-09-30 NOTE — ED Notes (Signed)
After evaluation, [employer] present to take pt away from facility

## 2015-09-30 NOTE — Discharge Instructions (Signed)
You were seen and evaluated today after being in the sun for long period of time. Please find a place in the shade to rest during the day. Follow-up with the resources outpatient.   The [employer] 211 is a great source of information about community services available.  Access by dialing 2-1-1 from anywhere in [place], or by website -  PooledIncome.pl.   Other Local Resources (Updated 05/2015)  Social  Contractor Number and Address  Aging, Disability and Transit Services  In-home services  Meals on wheels  Community meal sites  Transportation services  Adult day health care  Center for Active Retirement  Family caregiver support services [phone] [place], [place]  [place] [employer] of Social Services  Aging and adult services  Childrens services  Deaf-Blind services  Disability services  Guardianship  Hearing loss (assistive technology, interpreters, etc.)  Low-income services (health care, child care, housing, financial and nutrition assistance)  Medicaid  Pregnancy services  Utility assistance  Veterans services [phone] [address] [place], [place] [address]   [facility]  Assessment  Care management  Family education  Information and referral [phone] [address] [place], [place] [address]  [facility] Social Services  Aging and adult services  Childrens services  Deaf-Blind services  Disability services  Guardianship  Hearing loss (assistive technology, interpreters, etc.)  Low-income services (health care, child care, housing, financial and nutrition assistance)  Medicaid  Pregnancy services  Utility assistance  Veterans services [phone] [address] [place], [place] [address]  [facility] Department of Social Services  Aging and adult services  Childrens services  Deaf-Blind  services  Disability services  Guardianship  Hearing loss (assistive technology, interpreters, etc.)  Low-income services (health care, child care, housing, financial and nutrition assistance)  [facility]  Pregnancy services  Utility assistance  Veterans services [phone] [address] [place], [place] [address]  [facility] Department of Health and [employer]  Aging and adult services  Childrens services  Deaf-Blind services  Disability services  Guardianship  Hearing loss (assistive technology, interpreters, etc.)  Low-income services (health care, child care, housing, financial and nutrition assistance)  Medicaid  Pregnancy services  Utility assistance  Veterans services [phone] [address] [place], [place] [address]   [facility] Department of Social Services  Aging and adult services  Childrens services  Deaf-Blind services  Disability services  Guardianship  Hearing loss (assistive technology, interpreters, etc.)  Low-income services (health care, child care, housing, financial and nutrition assistance)  Medicaid  Pregnancy services  Utility assistance  Veterans services [phone] [address], [place], [place] [address]  [facility] Division of Social Services  Aging and adult services  Childrens services  Deaf-Blind services  Disability services  Guardianship  Hearing loss (assistive technology, interpreters, etc.)  Low-income services (health care, child care, housing, financial and nutrition assistance)  Medicaid  Pregnancy services  Utility assistance  Veterans services [phone] [address] [place], [place] [address]  [facility] [place]  Serves adults age 43 and over and their families  Caregiver information  Foster grandparents  Forensic scientist meals  Refugee programs  Retired Herbalist (RSVP)  Production assistant, radio Information Program [facility])  Administrator, Civil Service Program  SeniorLine  Support Groups  TeleCare [phone]  [address] [place], [place] [address]  [phone]  [address]  [place], [place] [phone]  Senior [employer]  Help Line  Home Care  Living at [employer]  Meals on Wheels  Senior Lunch Program  Adult Day Center  Equipment [facility]  Support Groups  Care Partner Workshops  Referrals for chore and homemaker services [phone]  [address] [place], [place] [address]  [employer]  Crisis assistance  Medication assistance  Rental assistance  Food pantry  Medication assistance  Housing assistance  Emergency food distribution  Utility assistance  Boys and Girls Clubs [phone] [address] [place], [place]   [phone](Tuesdays and Thursdays from 9am - 12 noon by appointment only) [address] [place], [place] [id number]  [phone] [address] [place], [place] [address]

## 2015-09-30 NOTE — ED Notes (Signed)
Pt arrives by EMS, pt states he got too hot sitting in sun.

## 2015-10-02 ENCOUNTER — Emergency Department (HOSPITAL_COMMUNITY)
Admission: EM | Admit: 2015-10-02 | Discharge: 2015-10-02 | Disposition: A | Discharge status: Home / Self Care | Payer: Medicaid Other | Attending: Emergency Medicine | Admitting: Emergency Medicine

## 2015-10-02 ENCOUNTER — Encounter (HOSPITAL_COMMUNITY): Payer: Self-pay | Admitting: Emergency Medicine

## 2015-10-02 ENCOUNTER — Telehealth: Payer: Self-pay

## 2015-10-02 DIAGNOSIS — F17210 Nicotine dependence, cigarettes, uncomplicated: Secondary | ICD-10-CM | POA: Insufficient documentation

## 2015-10-02 DIAGNOSIS — Z9114 Patient's other noncompliance with medication regimen: Secondary | ICD-10-CM

## 2015-10-02 DIAGNOSIS — Z5900 Homelessness unspecified: Secondary | ICD-10-CM

## 2015-10-02 DIAGNOSIS — I10 Essential (primary) hypertension: Secondary | ICD-10-CM | POA: Diagnosis not present

## 2015-10-02 DIAGNOSIS — Z9119 Patient's noncompliance with other medical treatment and regimen: Secondary | ICD-10-CM | POA: Diagnosis not present

## 2015-10-02 DIAGNOSIS — Z9889 Other specified postprocedural states: Secondary | ICD-10-CM | POA: Diagnosis not present

## 2015-10-02 DIAGNOSIS — I890 Lymphedema, not elsewhere classified: Secondary | ICD-10-CM | POA: Insufficient documentation

## 2015-10-02 DIAGNOSIS — Z7982 Long term (current) use of aspirin: Secondary | ICD-10-CM | POA: Diagnosis not present

## 2015-10-02 DIAGNOSIS — Z87828 Personal history of other (healed) physical injury and trauma: Secondary | ICD-10-CM | POA: Insufficient documentation

## 2015-10-02 DIAGNOSIS — Z8669 Personal history of other diseases of the nervous system and sense organs: Secondary | ICD-10-CM | POA: Diagnosis not present

## 2015-10-02 DIAGNOSIS — M6281 Muscle weakness (generalized): Secondary | ICD-10-CM | POA: Diagnosis present

## 2015-10-02 MED ORDER — FUROSEMIDE 20 MG PO TABS
40.0000 mg | ORAL_TABLET | Freq: Two times a day (BID) | ORAL | Status: DC
  Administered 2015-10-02: 40 mg via ORAL
  Filled 2015-10-02: qty 2

## 2015-10-02 MED ORDER — FUROSEMIDE 40 MG PO TABS: 40.0000 mg | ORAL_TABLET | Freq: Two times a day (BID) | ORAL | Status: DC

## 2015-10-02 MED ORDER — ASPIRIN 325 MG PO TABS
325.0000 mg | ORAL_TABLET | Freq: Every day | ORAL | Status: DC
  Administered 2015-10-02: 325 mg via ORAL
  Filled 2015-10-02: qty 1

## 2015-10-02 MED ORDER — CARVEDILOL 6.25 MG PO TABS
6.2500 mg | ORAL_TABLET | Freq: Two times a day (BID) | ORAL | Status: DC
Start: 2015-10-02 — End: 2016-05-15

## 2015-10-02 MED ORDER — CARVEDILOL 12.5 MG PO TABS
6.2500 mg | ORAL_TABLET | Freq: Two times a day (BID) | ORAL | Status: DC
  Administered 2015-10-02: 6.25 mg via ORAL
  Filled 2015-10-02: qty 1

## 2015-10-02 NOTE — Telephone Encounter (Signed)
Call received from Lebron ConnersLanae Anderson, SW  - [facility] # [phone]. She stated that the patient has been approved for medicaid and they need a current FL2 to apply for a facility placement for him. She stated that they have no record of receiving an FL2 for him in the past.   D Amao notified of the need for an FL2.

## 2015-10-02 NOTE — Telephone Encounter (Signed)
Call placed to Lebron ConnersLanae Anderson, SW - Protective Services  # [phone] to discuss request for FL2.  A  HIPAA compliant voice mail message was left requesting a call back to # [phone] or [phone].

## 2015-10-02 NOTE — Discharge Instructions (Signed)
Hypertension Hypertension, commonly called high blood pressure, is when the force of blood pumping through your arteries is too strong. Your arteries are the blood vessels that carry blood from your heart throughout your body. A blood pressure reading consists of a higher number over a lower number, such as 110/72. The higher number (systolic) is the pressure inside your arteries when your heart pumps. The lower number (diastolic) is the pressure inside your arteries when your heart relaxes. Ideally you want your blood pressure below 120/80. Hypertension forces your heart to work harder to pump blood. Your arteries may become narrow or stiff. Having untreated or uncontrolled hypertension can cause heart attack, stroke, kidney disease, and other problems. RISK FACTORS Some risk factors for high blood pressure are controllable. Others are not.  Risk factors you cannot control include:   Race. You may be at higher risk if you are African American.  Age. Risk increases with age.  Gender. Men are at higher risk than women before age 45 years. After age 65, women are at higher risk than men. Risk factors you can control include:  Not getting enough exercise or physical activity.  Being overweight.  Getting too much fat, sugar, calories, or salt in your diet.  Drinking too much alcohol. SIGNS AND SYMPTOMS Hypertension does not usually cause signs or symptoms. Extremely high blood pressure (hypertensive crisis) may cause headache, anxiety, shortness of breath, and nosebleed. DIAGNOSIS To check if you have hypertension, your health care provider will measure your blood pressure while you are seated, with your arm held at the level of your heart. It should be measured at least twice using the same arm. Certain conditions can cause a difference in blood pressure between your right and left arms. A blood pressure reading that is higher than normal on one occasion does not mean that you need treatment. If  it is not clear whether you have high blood pressure, you may be asked to return on a different day to have your blood pressure checked again. Or, you may be asked to monitor your blood pressure at home for 1 or more weeks. TREATMENT Treating high blood pressure includes making lifestyle changes and possibly taking medicine. Living a healthy lifestyle can help lower high blood pressure. You may need to change some of your habits. Lifestyle changes may include:  Following the DASH diet. This diet is high in fruits, vegetables, and whole grains. It is low in salt, red meat, and added sugars.  Keep your sodium intake below 2,300 mg per day.  Getting at least 30-45 minutes of aerobic exercise at least 4 times per week.  Losing weight if necessary.  Not smoking.  Limiting alcoholic beverages.  Learning ways to reduce stress. Your health care provider may prescribe medicine if lifestyle changes are not enough to get your blood pressure under control, and if one of the following is true:  You are 18-59 years of age and your systolic blood pressure is above 140.  You are 60 years of age or older, and your systolic blood pressure is above 150.  Your diastolic blood pressure is above 90.  You have diabetes, and your systolic blood pressure is over 140 or your diastolic blood pressure is over 90.  You have kidney disease and your blood pressure is above 140/90.  You have heart disease and your blood pressure is above 140/90. Your personal target blood pressure may vary depending on your medical conditions, your age, and other factors. HOME CARE INSTRUCTIONS    Have your blood pressure rechecked as directed by your health care provider.   Take medicines only as directed by your health care provider. Follow the directions carefully. Blood pressure medicines must be taken as prescribed. The medicine does not work as well when you skip doses. Skipping doses also puts you at risk for  problems.  Do not smoke.   Monitor your blood pressure at home as directed by your health care provider. SEEK MEDICAL CARE IF:   You think you are having a reaction to medicines taken.  You have recurrent headaches or feel dizzy.  You have swelling in your ankles.  You have trouble with your vision. SEEK IMMEDIATE MEDICAL CARE IF:  You develop a severe headache or confusion.  You have unusual weakness, numbness, or feel faint.  You have severe chest or abdominal pain.  You vomit repeatedly.  You have trouble breathing. MAKE SURE YOU:   Understand these instructions.  Will watch your condition.  Will get help right away if you are not doing well or get worse.   This information is not intended to replace advice given to you by your health care provider. Make sure you discuss any questions you have with your health care provider.   Document Released: 04/29/2005 Document Revised: 09/13/2014 Document Reviewed: 02/19/2013 Elsevier Interactive Patient Education 2016 Elsevier Inc.  
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
## 2015-10-02 NOTE — Progress Notes (Signed)
[name] familiar with pt from previous visits.  Pt's disability claim is in the appeals process and he has an attorney working to help him get approved.  At this time, pt has Medicaid, but he doesn't receive a disability check.  [name] doubtful that any ALF facility would accept him as he has no income to supplement what Medicaid ([place]) pays for ALF care.  There is no "free housing" available in [place], and pt states that shelters will not take him because he requires assistance with ADLs.  Pt is very familiar with shelters and other social services in the area.  He states that he has no friends or family who can help him out while his disability is pending as everyone "is about dead."  [facility] and was told, today, he could not stay on their property any longer.  Unfortunately, there is little [name] can do to assist this pt.  Taxi voucher offered to pt, but he was unable identify an address that he wanted to go to.

## 2015-10-02 NOTE — ED Notes (Signed)
Patient states [facility] and was sitting in a chair for 1 hour. He was unable to ambulate, called EMS. Hx HTN. BP 174/92, HR 88, resp normal. Patient presents to ED alert and orientedx4. States he is homeless. Patient belongings in room. BP 192/97. HR 97, 98% room air. Severe lower extremity swelling. States "It's never been this bad". "

## 2015-10-04 NOTE — Telephone Encounter (Signed)
Pt. Caregiver came into facility to drop off a PCS form to be filled out by pt. PCP. Form will be put in providers box. Please f/u

## 2015-10-04 NOTE — Progress Notes (Signed)
FL2 was completed by Dr. Venetia NightAmao. FL2 and requested documentation sent to Lebron ConnersLanae Anderson, SW-Protective Services (Fax#[phone]) so she can work on placement for patient.

## 2015-10-05 ENCOUNTER — Telehealth: Payer: Self-pay

## 2015-10-05 NOTE — Telephone Encounter (Signed)
This Case Manager placed call to Lebron ConnersLanae Anderson, SW-Protective Services (#[phone], Fax #[phone]) to inform that [facility] and requested documentation for patient was faxed on 10/04/15 and to ensure she received requested information. Unable to reach; voicemail left requesting return call.

## 2015-10-06 ENCOUNTER — Emergency Department (HOSPITAL_COMMUNITY)
Admission: EM | Admit: 2015-10-06 | Discharge: 2015-10-06 | Disposition: A | Discharge status: Home / Self Care | Payer: Medicaid Other | Attending: Emergency Medicine | Admitting: Emergency Medicine

## 2015-10-06 ENCOUNTER — Encounter (HOSPITAL_COMMUNITY): Payer: Self-pay | Admitting: Emergency Medicine

## 2015-10-06 DIAGNOSIS — I10 Essential (primary) hypertension: Secondary | ICD-10-CM | POA: Diagnosis not present

## 2015-10-06 DIAGNOSIS — Z79899 Other long term (current) drug therapy: Secondary | ICD-10-CM | POA: Insufficient documentation

## 2015-10-06 DIAGNOSIS — M79662 Pain in left lower leg: Secondary | ICD-10-CM | POA: Diagnosis not present

## 2015-10-06 DIAGNOSIS — F17210 Nicotine dependence, cigarettes, uncomplicated: Secondary | ICD-10-CM | POA: Insufficient documentation

## 2015-10-06 DIAGNOSIS — M79605 Pain in left leg: Secondary | ICD-10-CM

## 2015-10-06 DIAGNOSIS — Z7982 Long term (current) use of aspirin: Secondary | ICD-10-CM | POA: Insufficient documentation

## 2015-10-06 NOTE — ED Notes (Signed)
Attempted to get pt ready for discharge. Pt states that he does not understand why he is being sent home because he is still unable to walk. Pt stated he wanted to talk to someone about it.

## 2015-10-06 NOTE — ED Notes (Signed)
PT being transported by [facility] at this time

## 2015-10-06 NOTE — ED Notes (Signed)
Pt placed back in clothes. Pt waiting on [facility]

## 2015-10-06 NOTE — ED Notes (Signed)
Pt asked to talk to Child psychotherapistsocial worker. Morrie SheldonAshley, SW called to come get patient
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
## 2015-10-06 NOTE — Progress Notes (Signed)
[name] engaged with Patient at Patient's bedside at his request to discuss his wishes to be placed in a facility. [name] explained to Patient that an assisted living facility would require the Patient to have LTC (long term care) Medicaid. [name] explained that although Patient has Medicaid, he is not receiving disability or SSI as his disability claim is in the appeals process and his attorney is working to help him get approved. Facilities require some source of income. [name] emphasized that Patient stay on his [facility] case worker, Harriett Sineerrance and [facility] SW Lebron ConnersLanae Anderson [phone]) who have been working on facility placement for Patient. Patient in agreement. Patient denies any further concerns at this time. [name] signing off. Please contact if new need(s) arise.      Lance MussAshley Gardner,MSW, LCSW [facility]/68M Clinical Social Worker [phone]

## 2015-10-06 NOTE — ED Notes (Signed)
[facility]. Pt fell last night and this morning due to swelling and pain in his legs that has been going on for "awhile". No LOC, vitals stable, no obvious deformities. BP 110/64, P88, RR 18, 97% on RA

## 2015-10-06 NOTE — ED Notes (Signed)
Notified [facility] for transportation back home 

## 2015-10-06 NOTE — ED Notes (Signed)
[facility] to pick patient up

## 2015-10-06 NOTE — ED Notes (Signed)
SW at the bedside. [facility] Waiting for patient

## 2015-10-09 ENCOUNTER — Emergency Department (HOSPITAL_COMMUNITY)
Admission: EM | Admit: 2015-10-09 | Discharge: 2015-10-09 | Disposition: A | Discharge status: Home / Self Care | Payer: Medicaid Other | Attending: Emergency Medicine | Admitting: Emergency Medicine

## 2015-10-09 ENCOUNTER — Encounter (HOSPITAL_COMMUNITY): Payer: Self-pay

## 2015-10-09 DIAGNOSIS — I10 Essential (primary) hypertension: Secondary | ICD-10-CM | POA: Insufficient documentation

## 2015-10-09 DIAGNOSIS — I890 Lymphedema, not elsewhere classified: Secondary | ICD-10-CM | POA: Diagnosis not present

## 2015-10-09 DIAGNOSIS — M79606 Pain in leg, unspecified: Secondary | ICD-10-CM | POA: Diagnosis not present

## 2015-10-09 DIAGNOSIS — F17210 Nicotine dependence, cigarettes, uncomplicated: Secondary | ICD-10-CM | POA: Diagnosis not present

## 2015-10-09 DIAGNOSIS — Z590 Homelessness: Secondary | ICD-10-CM | POA: Diagnosis not present

## 2015-10-09 DIAGNOSIS — G8929 Other chronic pain: Secondary | ICD-10-CM | POA: Insufficient documentation

## 2015-10-09 DIAGNOSIS — Z7982 Long term (current) use of aspirin: Secondary | ICD-10-CM | POA: Insufficient documentation

## 2015-10-09 DIAGNOSIS — R2243 Localized swelling, mass and lump, lower limb, bilateral: Secondary | ICD-10-CM | POA: Diagnosis present

## 2015-10-09 DIAGNOSIS — Z79899 Other long term (current) drug therapy: Secondary | ICD-10-CM | POA: Insufficient documentation

## 2015-10-09 DIAGNOSIS — Z87828 Personal history of other (healed) physical injury and trauma: Secondary | ICD-10-CM | POA: Insufficient documentation

## 2015-10-09 MED ORDER — FUROSEMIDE 20 MG PO TABS
20.0000 mg | ORAL_TABLET | Freq: Once | ORAL | Status: AC
  Administered 2015-10-09: 20 mg via ORAL
  Filled 2015-10-09: qty 1

## 2015-10-09 MED ORDER — ACETAMINOPHEN 325 MG PO TABS
650.0000 mg | ORAL_TABLET | Freq: Once | ORAL | Status: AC
Start: 2015-10-09 — End: 2015-10-09
  Administered 2015-10-09: 650 mg via ORAL
  Filled 2015-10-09: qty 2

## 2015-10-09 NOTE — ED Notes (Signed)
Patient Alert and oriented X4. Patient verbalized understanding of the discharge instructions.  Patient belongings were taken by the patient.  

## 2015-10-09 NOTE — ED Notes (Signed)
Patient come via EMS with complaints of leg edema with point tenderness.  Patient A&Ox4. Patient knows he is here a lot and wishes to get in contact with social work to get a home placement.
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
## 2015-10-09 NOTE — ED Notes (Signed)
Called social work and left fathers phone number as a contact source for him.  Requested they call him back to work out placement into a home that is more conducive to his mobility limitations. Fathers phone #: [phone]

## 2015-10-09 NOTE — Discharge Instructions (Signed)
The First AmericanCommunity Resource Guide Shelters The United Ways 211 is a great source of information about community services available.  Access by dialing 2-1-1 from anywhere in [place], or by website -  PooledIncome.plwww.nc211.org.   Other Local Resources (Updated 05/2015)  Shelters  Services   Phone Number and Address  [facility]  Housing for homeless and needy men with substance abuse issues [phone] [address] [place], [place]  [facility] [place]  Emergency assistance  [facility]  Meals  Pantry services [phone] [place], [place]  [facility]  Domestic violence shelter for women and their children [phone] 182 [address] [place], [place]  [facility]  Domestic violence shelter for women and their children [phone] 865 8879 [place], [place]  [facility]     The [facility] coordinates access to most shelters in [place]  Apply in person Monday - Friday, 10 am - 4 pm.    After hours/ weekends, contact individual shelters directly [phone] [address] [place], [place]  [facility]   Housing  Food  Emergency financial assistance  Permanent supportive housing [phone] [address] [place], [place]   [facility]   Crisis assistance  Medication  Housing  Food  Utility assistance [phone] [address] [place], [place]  [facility] [employer] [facility]    Crisis assistance  Medication  Housing  Food  Utility assistance [phone] [address], [place], [place]  [facility]     Transitional housing  Case Proofreadermanagement  Utility assistance  Food  Clothing  Transportation assistance [phone] [address] [place], [place]  [facility] [place] [facility]  Shelter for adult men and women [phone] [address] [place], [place]  24-hour Crisis Line for those Facing Homelessness     [phone]  [facility] [employer]Community Resource Guide Financial Assistance The United Ways 211 is a great source of information about community services available.  Access by dialing 2-1-1 from anywhere in [place], or by website -  PooledIncome.plwww.nc211.org.   Other Local Resources (Updated 05/2015)  Financial Assistance   Services    Phone Number and Address  [facility]  Low-cost medical care - 1st and 3rd Saturday of every month  Must not qualify for public or private insurance and must have limited income [phone] [address] [place], [place]    [place] [facility] of Social Services  Child care  Emergency assistance for housing and Kimberly-Clarkutilities  Food stamps  Medicaid [phone] [address] [place], [place] [phone]   [facility]  Low-cost medical care for children, communicable diseases, sexually-transmitted diseases, immunizations, maternity care, womens health and family planning [phone] [address] [place], [place] [phone]  [facility]   Medication assistance for [place] residents  Must meet income requirements [phone] [address] [place], [place].    [facility]  Child care  Emergency assistance for housing and Kimberly-Clarkutilities  Food stamps  Medicaid [phone] [address] [place], [place] [address]  [facility]   Low-cost medical care,   Monday through Friday, 9 am to 6 pm.   Accepts Medicare/Medicaid, and self-pay [phone] [address] [place], [place] [address]  [facility]  Low-cost medical care - Monday through Friday, 8:30 am - 5:30 pm  Accepts Medicaid and self-pay [phone] [address] [place], [place] [address]  [facility]  Primary medical care, including for those with sickle cell  disease  Accepts Medicare, Medicaid, insurance and self-pay [phone] [address] [place], [place]  [facility]   Primary medical care  Accepts Medicare, [place], insurance and self-pay [phone] [address] [place], [place] [address]   [facility] Department of Social Services  Child care  Emergency assistance for housing and [employer]  Medicaid [phone] [address] [place], [place] [address]  [facility] Department of Health and [employer]  Child care  Emergency assistance for housing and [employer]ark  Medicaid [phone] [address] [place], [place] [address]   [facility] Medication Assistance Program  Medication assistance for [facility] residents with no insurance only  Must have a primary care doctor [phone] [address] [place], [place]  [facility]   Primary medical care  [place], [place], insurance  [phone] [address] [place], [place]  [facility]   Medication assistance [phone]  [facility]   Primary medical care  Accepts Medicare, [place], insurance and self-pay [phone] [address] [place], [place] [address]  [facility]   Primary medical care  Accepts Medicare, [place], insurance and self-pay [phone] [address] [place], [place] [address]  [facility]  For [place] residents between the ages of 66 and 43 who do not have any form of health insurance, Medicare, [place], or [place] benefits.  Services are provided free of charge to uninsured patients who fall within federal poverty guidelines.    Hours: Tuesdays and Thursdays, 4:15 - 8 pm [phone] [address] [place], [place] [address]  [facility]     Primary medical care  Dental care  Nutritional  counseling  Pharmacy  Accepts Medicaid, Medicare, most insurance.  Fees are adjusted based on ability to pay.   [phone] [facility] [address] [place], [place]  [phone] [facility] [address] [place], [place]  [phone] [facility] [place], [place]  [phone] [facility], [address] [place], [place]  [phone] [facility] [address] [place], [place]  [facility]  Womens health and family planning [phone] [place], [place]  [facility] Department of Social Services  Child care  Emergency assistance for housing and [employer]ark  Medicaid [phone] [address] [place], [place] [address]   [facility]    Ages 25 and older  Hours: Mondays and Thursdays, 7:00 am - 9:00 am Patients are seen on a first come, first served basis. [phone], [phone] [place], [place]  [facility] of Social Services  Child care  Emergency assistance for housing and [employer]ark  Medicaid [phone] [place], [place] [address]  [facility]  Medication assistance  Rental assistance  Food pantry  Medication assistance  Housing assistance  Emergency food distribution  Utility assistance [phone] [address] [place], [place]  [phone]  [address] [place], [place] [address] Hours: Tuesdays and Thursdays from 9am - 12 noon by appointment only  [phone] [address] [place], [place] [address]  [facility] Lanae Boast   Accepts private insurance, [place], and [place].  Payment is based on a sliding scale  for those without insurance.  Hours: Mondays, Tuesdays and Thursdays, 8:30 am - 5:30 pm.   [phone] [address] [place], [place]   [facility] - Family Medicine at [facility], [place], and [place].  Payment is based on a sliding scale for those without insurance. [phone] [address] [place], [place]  [facility] - Pediatrics at E. Scientist, research (physical sciences), [employer], and [place].  Payment is based on a sliding scale for those without insurance [phone] [address], [employer], [place]  [facility] - Pediatrics at [employer], [place], and [place].  Payment is based on a sliding scale for those without insurance. [phone] [address] [place], [place]  [facility] - Pediatrics at [facility], [place], and [place].  Payment is based on a sliding scale for those without insurance. [phone], ext. [phone] [place], [place].    [facility]  Maternity care.  Accepts Medicaid and self-pay. [phone] [address] [place], [place]

## 2015-10-09 NOTE — ED Notes (Signed)
Patient given bus pass upon discharge

## 2015-10-10 ENCOUNTER — Telehealth: Payer: Self-pay

## 2015-10-10 ENCOUNTER — Encounter (HOSPITAL_COMMUNITY): Payer: Self-pay | Admitting: Emergency Medicine

## 2015-10-10 ENCOUNTER — Emergency Department (HOSPITAL_COMMUNITY)
Admission: EM | Admit: 2015-10-10 | Discharge: 2015-10-10 | Disposition: A | Discharge status: Home / Self Care | Payer: Medicaid Other | Attending: Emergency Medicine | Admitting: Emergency Medicine

## 2015-10-10 DIAGNOSIS — I890 Lymphedema, not elsewhere classified: Secondary | ICD-10-CM | POA: Diagnosis not present

## 2015-10-10 DIAGNOSIS — M7989 Other specified soft tissue disorders: Secondary | ICD-10-CM | POA: Diagnosis present

## 2015-10-10 DIAGNOSIS — Z5900 Homelessness unspecified: Secondary | ICD-10-CM

## 2015-10-10 DIAGNOSIS — Z79899 Other long term (current) drug therapy: Secondary | ICD-10-CM | POA: Diagnosis not present

## 2015-10-10 DIAGNOSIS — I10 Essential (primary) hypertension: Secondary | ICD-10-CM | POA: Insufficient documentation

## 2015-10-10 DIAGNOSIS — F17210 Nicotine dependence, cigarettes, uncomplicated: Secondary | ICD-10-CM | POA: Insufficient documentation

## 2015-10-10 DIAGNOSIS — Z7982 Long term (current) use of aspirin: Secondary | ICD-10-CM | POA: Diagnosis not present

## 2015-10-10 NOTE — ED Notes (Signed)
Patient here with complaints of bilateral leg weakness.

## 2015-10-10 NOTE — Telephone Encounter (Signed)
Call received from Lebron ConnersLanae Anderson, SW - DSS requesting a copy of the patient's TB test results be faxed to # [phone].  Call also received from [facility], informing this CM that he [facility] have [facility] TB test read in December 2016.  Call placed to Lebron ConnersLanae Anderson, SW - DSS informing her that a copy of the TB test result will be faxed to her.  A copy of the TB test results were faxed to Lebron ConnersLanae  Anderson, SW at # [phone].
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
## 2015-10-10 NOTE — ED Notes (Signed)
Patient ambulated with 2 person assistance and rolling walker to lobby. Patient taken from lobby to bus stop, with a bus pass, by security and Psychologist, sport and exercisenurse tech.

## 2015-10-10 NOTE — Discharge Instructions (Signed)
Lymphedema  Lymphedema is swelling that is caused by the abnormal collection of lymph under the skin. Lymph is fluid from the tissues in your body that travels in the lymphatic system. This system is part of the immune system and includes lymph nodes and lymph vessels. The lymph vessels collect and carry the excess fluid, fats, proteins, and wastes from the tissues of the body to the bloodstream. This system also works to clean and remove bacteria and waste products from the body.  Lymphedema occurs when the lymphatic system is blocked. When the lymph vessels or lymph nodes are blocked or damaged, lymph does not drain properly, causing an abnormal buildup of lymph. This leads to swelling in the arms or legs. Lymphedema cannot be cured by medicines, but various methods can be used to help reduce the swelling.  CAUSES  There are two types of lymphedema. Primary lymphedema is caused by the absence or abnormality of the lymph vessel at birth. Secondary lymphedema is more common. It occurs when the lymph vessel is damaged or blocked. Common causes of lymph vessel blockage include:  · Skin infection, such as cellulitis.  · Infection by parasites (filariasis).  · Injury.  · Cancer.  · Radiation therapy.  · Formation of scar tissue.  · Surgery.  SYMPTOMS  Symptoms of this condition include:  · Swelling of the arm or leg.  · A heavy or tight feeling in the arm or leg.  · Swelling of the feet, toes, or fingers. Shoes or rings may fit more tightly than before.  · Redness of the skin over the affected area.  · Limited movement of the affected limb.  · Sensitivity to touch or discomfort in the affected limb.  DIAGNOSIS  This condition may be diagnosed with:  · A physical exam.  · Medical history.  · Imaging tests, such as:    Lymphoscintigraphy. In this test, a low dose of a radioactive substance is injected to trace the flow of lymph through the lymph vessels.    MRI.    CT scan.    Duplex ultrasound. This test uses sound waves  to produce images of the vessels and the blood flow on a screen.    Lymphangiography. In this test, a contrast dye is injected into the lymph vessel to help show blockages.  TREATMENT  Treatment for this condition may depend on the cause. Treatment may include:  · Exercise. Certain exercises can help fluid move out of the affected limb.  · Massage. Gentle massage of the affected limb can help move the fluid out of the area.  · Compression. Various methods may be used to apply pressure to the affected limb in order to reduce the swelling.    Wearing compression stockings or sleeves on the affected limb.    Bandaging the affected limb.    Using an external pump that is attached to a sleeve that alternates between applying pressure and releasing pressure.  · Surgery. This is usually only done for severe cases. For example, surgery may be done if you have trouble moving the limb or if the swelling does not get better with other treatments.  If an underlying condition is causing the lymphedema, treatment for that condition is needed. For example, antibiotic medicines may be used to treat an infection.  HOME CARE INSTRUCTIONS  Activities  · Exercise regularly as directed by your health care provider.  · Do not sit with your legs crossed.  · When possible, keep the affected limb   raised (elevated) above the level of your heart.  · Avoid carrying things with an arm that is affected by lymphedema.  · Remember that the affected area is more likely to become injured or infected.  · Take these steps to help prevent infection:    Keep the affected area clean and dry.    Protect your skin from cuts. For example, you should use gloves while cooking or gardening. Do not walk barefoot. If you shave the affected area, use an electric razor.  General Instructions  · Take medicines only as directed by your health care provider.  · Eat a healthy diet that includes a lot of fruits and vegetables.  · Do not wear tight clothes, shoes, or  jewelry.  · Do not use heating pads over the affected area.  · Avoid having blood pressure checked on the affected limb.  · Keep all follow-up visits as directed by your health care provider. This is important.  SEEK MEDICAL CARE IF:  · You continue to have swelling in your limb.  · You have a fever.  · You have a cut that does not heal.  · You have redness or pain in the affected area.  · You have new swelling in your limb that comes on suddenly.  · You develop purplish spots or sores (lesions) on your limb.  SEEK IMMEDIATE MEDICAL CARE IF:  · You have a skin rash.  · You have chills or sweats.  · You have shortness of breath.     This information is not intended to replace advice given to you by your health care provider. Make sure you discuss any questions you have with your health care provider.     Document Released: 02/24/2007 Document Revised: 09/13/2014 Document Reviewed: 04/06/2014  Elsevier Interactive Patient Education ©2016 Elsevier Inc.

## 2015-10-10 NOTE — ED Notes (Signed)
Patient given snack per MD order

## 2015-10-10 NOTE — ED Notes (Signed)
Bed: WA08 Expected date:  Expected time:  Means of arrival:  Comments: EMS/Unable to ambulate

## 2015-10-11 ENCOUNTER — Emergency Department (HOSPITAL_COMMUNITY)
Admission: EM | Admit: 2015-10-11 | Discharge: 2015-10-11 | Disposition: A | Discharge status: Home / Self Care | Payer: Medicaid Other | Attending: Emergency Medicine | Admitting: Emergency Medicine

## 2015-10-11 ENCOUNTER — Encounter (HOSPITAL_COMMUNITY): Payer: Self-pay | Admitting: Emergency Medicine

## 2015-10-11 DIAGNOSIS — F17210 Nicotine dependence, cigarettes, uncomplicated: Secondary | ICD-10-CM | POA: Insufficient documentation

## 2015-10-11 DIAGNOSIS — I890 Lymphedema, not elsewhere classified: Secondary | ICD-10-CM | POA: Diagnosis not present

## 2015-10-11 DIAGNOSIS — Z7982 Long term (current) use of aspirin: Secondary | ICD-10-CM | POA: Insufficient documentation

## 2015-10-11 DIAGNOSIS — Z79899 Other long term (current) drug therapy: Secondary | ICD-10-CM | POA: Diagnosis not present

## 2015-10-11 DIAGNOSIS — Z5900 Homelessness unspecified: Secondary | ICD-10-CM

## 2015-10-11 DIAGNOSIS — R609 Edema, unspecified: Secondary | ICD-10-CM

## 2015-10-11 DIAGNOSIS — I10 Essential (primary) hypertension: Secondary | ICD-10-CM | POA: Diagnosis not present

## 2015-10-11 DIAGNOSIS — R600 Localized edema: Secondary | ICD-10-CM | POA: Insufficient documentation

## 2015-10-11 DIAGNOSIS — Z9114 Patient's other noncompliance with medication regimen: Secondary | ICD-10-CM

## 2015-10-11 DIAGNOSIS — M79606 Pain in leg, unspecified: Secondary | ICD-10-CM

## 2015-10-11 DIAGNOSIS — Z9119 Patient's noncompliance with other medical treatment and regimen: Secondary | ICD-10-CM | POA: Diagnosis not present

## 2015-10-11 DIAGNOSIS — R2243 Localized swelling, mass and lump, lower limb, bilateral: Secondary | ICD-10-CM | POA: Diagnosis present

## 2015-10-11 MED ORDER — FUROSEMIDE 40 MG PO TABS: 40.0000 mg | ORAL_TABLET | Freq: Two times a day (BID) | ORAL | Status: DC

## 2015-10-11 MED ORDER — FUROSEMIDE 20 MG PO TABS
40.0000 mg | ORAL_TABLET | Freq: Once | ORAL | Status: DC
  Filled 2015-10-11: qty 2

## 2015-10-11 NOTE — ED Notes (Signed)
[facility]. Seen the past 2 days for same. No change in condition.
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
## 2015-10-11 NOTE — Care Management (Addendum)
ED CM was consulted concerning patient's  disposition plan. [facility] and receives medical [phone] . As per [facility] CM [facility] is working with [facility] on patient placement.  CM will follow up with [facility],

## 2015-10-11 NOTE — ED Notes (Signed)
Patient left at this time with all belongings. 

## 2015-10-11 NOTE — Discharge Instructions (Signed)
Wear compression stockings, elevate your legs to help with pain and swelling. Take lasix as directed. Use tylenol or motrin as needed for pain. Follow up with the [facility] [name]) tomorrow for ongoing management of your chronic lymphedema. Return to the ER only for emergent changes or worsening symptoms.   Lymphedema Lymphedema is swelling that is caused by the abnormal collection of lymph under the skin. Lymph is fluid from the tissues in your body that travels in the lymphatic system. This system is part of the immune system and includes lymph nodes and lymph vessels. The lymph vessels collect and carry the excess fluid, fats, proteins, and wastes from the tissues of the body to the bloodstream. This system also works to clean and remove bacteria and waste products from the body. Lymphedema occurs when the lymphatic system is blocked. When the lymph vessels or lymph nodes are blocked or damaged, lymph does not drain properly, causing an abnormal buildup of lymph. This leads to swelling in the arms or legs. Lymphedema cannot be cured by medicines, but various methods can be used to help reduce the swelling. CAUSES There are two types of lymphedema. Primary lymphedema is caused by the absence or abnormality of the lymph vessel at birth. Secondary lymphedema is more common. It occurs when the lymph vessel is damaged or blocked. Common causes of lymph vessel blockage include:  Skin infection, such as cellulitis.  Infection by parasites (filariasis).  Injury.  Cancer.  Radiation therapy.  Formation of scar tissue.  Surgery. SYMPTOMS Symptoms of this condition include:  Swelling of the arm or leg.  A heavy or tight feeling in the arm or leg.  Swelling of the feet, toes, or fingers. Shoes or rings may fit more tightly than before.  Redness of the skin over the affected area.  Limited movement of the affected limb.  Sensitivity to touch or discomfort in the affected  limb. DIAGNOSIS This condition may be diagnosed with:  A physical exam.  Medical history.  Imaging tests, such as:  Lymphoscintigraphy. In this test, a low dose of a radioactive substance is injected to trace the flow of lymph through the lymph vessels.  MRI.  CT scan.  Duplex ultrasound. This test uses sound waves to produce images of the vessels and the blood flow on a screen.  Lymphangiography. In this test, a contrast dye is injected into the lymph vessel to help show blockages. TREATMENT Treatment for this condition may depend on the cause. Treatment may include:  Exercise. Certain exercises can help fluid move out of the affected limb.  Massage. Gentle massage of the affected limb can help move the fluid out of the area.  Compression. Various methods may be used to apply pressure to the affected limb in order to reduce the swelling.  Wearing compression stockings or sleeves on the affected limb.  Bandaging the affected limb.  Using an external pump that is attached to a sleeve that alternates between applying pressure and releasing pressure.  Surgery. This is usually only done for severe cases. For example, surgery may be done if you have trouble moving the limb or if the swelling does not get better with other treatments. If an underlying condition is causing the lymphedema, treatment for that condition is needed. For example, antibiotic medicines may be used to treat an infection. HOME CARE INSTRUCTIONS Activities  Exercise regularly as directed by your health care provider.  Do not sit with your legs crossed.  When possible, keep the affected  limb raised (elevated) above the level of your heart.  Avoid carrying things with an arm that is affected by lymphedema.  Remember that the affected area is more likely to become injured or infected.  Take these steps to help prevent infection:  Keep the affected area clean and dry.  Protect your skin from cuts. For  example, you should use gloves while cooking or gardening. Do not walk barefoot. If you shave the affected area, use an Neurosurgeon. General Instructions  Take medicines only as directed by your health care provider.  Eat a healthy diet that includes a lot of fruits and vegetables.  Do not wear tight clothes, shoes, or jewelry.  Do not use heating pads over the affected area.  Avoid having blood pressure checked on the affected limb.  Keep all follow-up visits as directed by your health care provider. This is important. SEEK MEDICAL CARE IF:  You continue to have swelling in your limb.  You have a fever.  You have a cut that does not heal.  You have redness or pain in the affected area.  You have new swelling in your limb that comes on suddenly.  You develop purplish spots or sores (lesions) on your limb. SEEK IMMEDIATE MEDICAL CARE IF:  You have a skin rash.  You have chills or sweats.  You have shortness of breath.   This information is not intended to replace advice given to you by your health care provider. Make sure you discuss any questions you have with your health care provider.   Document Released: 02/24/2007 Document Revised: 09/13/2014 Document Reviewed: 04/06/2014 Elsevier Interactive Patient Education 2016 Elsevier Inc.  Peripheral Edema You have swelling in your legs (peripheral edema). This swelling is due to excess accumulation of salt and water in your body. Edema may be a sign of heart, kidney or liver disease, or a side effect of a medication. It may also be due to problems in the leg veins. Elevating your legs and using special support stockings may be very helpful, if the cause of the swelling is due to poor venous circulation. Avoid long periods of standing, whatever the cause. Treatment of edema depends on identifying the cause. Chips, pretzels, pickles and other salty foods should be avoided. Restricting salt in your diet is almost always needed.  Water pills (diuretics) are often used to remove the excess salt and water from your body via urine. These medicines prevent the kidney from reabsorbing sodium. This increases urine flow. Diuretic treatment may also result in lowering of potassium levels in your body. Potassium supplements may be needed if you have to use diuretics daily. Daily weights can help you keep track of your progress in clearing your edema. You should call your caregiver for follow up care as recommended. SEEK IMMEDIATE MEDICAL CARE IF:   You have increased swelling, pain, redness, or heat in your legs.  You develop shortness of breath, especially when lying down.  You develop chest or abdominal pain, weakness, or fainting.  You have a fever.   This information is not intended to replace advice given to you by your health care provider. Make sure you discuss any questions you have with your health care provider.   Document Released: 06/06/2004 Document Revised: 07/22/2011 Document Reviewed: 11/09/2014 Elsevier Interactive Patient Education 2016 Elsevier Inc.  DASH Eating Plan DASH stands for "Dietary Approaches to Stop Hypertension." The DASH eating plan is a healthy eating plan that has been shown to reduce high blood pressure (  hypertension). Additional health benefits may include reducing the risk of type 2 diabetes mellitus, heart disease, and stroke. The DASH eating plan may also help with weight loss. WHAT DO I NEED TO KNOW ABOUT THE DASH EATING PLAN? For the DASH eating plan, you will follow these general guidelines:  Choose foods with a percent daily value for sodium of less than 5% (as listed on the food label).  Use salt-free seasonings or herbs instead of table salt or sea salt.  Check with your health care provider or pharmacist before using salt substitutes.  Eat lower-sodium products, often labeled as "lower sodium" or "no salt added."  Eat fresh foods.  Eat more vegetables, fruits, and low-fat  dairy products.  Choose whole grains. Look for the word "whole" as the first word in the ingredient list.  Choose fish and skinless chicken or [place] more often than red meat. Limit fish, poultry, and meat to 6 oz (170 g) each day.  Limit sweets, desserts, sugars, and sugary drinks.  Choose heart-healthy fats.  Limit cheese to 1 oz (28 g) per day.  Eat more home-cooked food and less restaurant, buffet, and fast food.  Limit fried foods.  Cook foods using methods other than frying.  Limit canned vegetables. If you do use them, rinse them well to decrease the sodium.  When eating at a restaurant, ask that your food be prepared with less salt, or no salt if possible. WHAT FOODS CAN I EAT? Seek help from a dietitian for individual calorie needs. Grains Whole grain or whole wheat bread. Brown rice. Whole grain or whole wheat pasta. Quinoa, bulgur, and whole grain cereals. Low-sodium cereals. Corn or whole wheat flour tortillas. Whole grain cornbread. Whole grain crackers. Low-sodium crackers. Vegetables Fresh or frozen vegetables (raw, steamed, roasted, or grilled). Low-sodium or reduced-sodium tomato and vegetable juices. Low-sodium or reduced-sodium tomato sauce and paste. Low-sodium or reduced-sodium canned vegetables.  Fruits All fresh, canned (in natural juice), or frozen fruits. Meat and Other Protein Products Ground beef (85% or leaner), grass-fed beef, or beef trimmed of fat. Skinless chicken or [place]. Ground chicken or [place]. Pork trimmed of fat. All fish and seafood. Eggs. Dried beans, peas, or lentils. Unsalted nuts and seeds. Unsalted canned beans. Dairy Low-fat dairy products, such as skim or 1% milk, 2% or reduced-fat cheeses, low-fat ricotta or cottage cheese, or plain low-fat yogurt. Low-sodium or reduced-sodium cheeses. Fats and Oils Tub margarines without trans fats. Light or reduced-fat mayonnaise and salad dressings (reduced sodium). Avocado. Safflower, olive, or  canola oils. Natural peanut or almond butter. Other Unsalted popcorn and pretzels. The items listed above may not be a complete list of recommended foods or beverages. Contact your dietitian for more options. WHAT FOODS ARE NOT RECOMMENDED? Grains White bread. White pasta. White rice. Refined cornbread. Bagels and croissants. Crackers that contain trans fat. Vegetables Creamed or fried vegetables. Vegetables in a cheese sauce. Regular canned vegetables. Regular canned tomato sauce and paste. Regular tomato and vegetable juices. Fruits Dried fruits. Canned fruit in light or heavy syrup. Fruit juice. Meat and Other Protein Products Fatty cuts of meat. Ribs, chicken wings, bacon, sausage, bologna, salami, chitterlings, fatback, hot dogs, bratwurst, and packaged luncheon meats. Salted nuts and seeds. Canned beans with salt. Dairy Whole or 2% milk, cream, half-and-half, and cream cheese. Whole-fat or sweetened yogurt. Full-fat cheeses or blue cheese. Nondairy creamers and whipped toppings. Processed cheese, cheese spreads, or cheese curds. Condiments Onion and garlic salt, seasoned salt, table salt, and sea salt.  Canned and packaged gravies. Worcestershire sauce. Tartar sauce. Barbecue sauce. Teriyaki sauce. Soy sauce, including reduced sodium. Steak sauce. Fish sauce. Oyster sauce. Cocktail sauce. Horseradish. Ketchup and mustard. Meat flavorings and tenderizers. Bouillon cubes. Hot sauce. Tabasco sauce. Marinades. Taco seasonings. Relishes. Fats and Oils Butter, stick margarine, lard, shortening, ghee, and bacon fat. Coconut, palm kernel, or palm oils. Regular salad dressings. Other Pickles and olives. Salted popcorn and pretzels. The items listed above may not be a complete list of foods and beverages to avoid. Contact your dietitian for more information. WHERE CAN I FIND MORE INFORMATION? National Heart, Lung, and Blood Institute: CablePromo.itwww.nhlbi.nih.gov/health/health-topics/topics/dash/   This  information is not intended to replace advice given to you by your health care provider. Make sure you discuss any questions you have with your health care provider.   Document Released: 04/18/2011 Document Revised: 05/20/2014 Document Reviewed: 03/03/2013 Elsevier Interactive Patient Education [employer] Elsevier Inc.

## 2015-10-12 ENCOUNTER — Encounter (HOSPITAL_COMMUNITY): Payer: Self-pay

## 2015-10-12 ENCOUNTER — Emergency Department (HOSPITAL_COMMUNITY)
Admission: EM | Admit: 2015-10-12 | Discharge: 2015-10-12 | Disposition: A | Discharge status: Home / Self Care | Payer: Medicaid Other | Attending: Emergency Medicine | Admitting: Emergency Medicine

## 2015-10-12 DIAGNOSIS — Z5900 Homelessness unspecified: Secondary | ICD-10-CM

## 2015-10-12 DIAGNOSIS — Z7982 Long term (current) use of aspirin: Secondary | ICD-10-CM | POA: Diagnosis not present

## 2015-10-12 DIAGNOSIS — Z79899 Other long term (current) drug therapy: Secondary | ICD-10-CM | POA: Diagnosis not present

## 2015-10-12 DIAGNOSIS — I890 Lymphedema, not elsewhere classified: Secondary | ICD-10-CM | POA: Diagnosis not present

## 2015-10-12 DIAGNOSIS — I10 Essential (primary) hypertension: Secondary | ICD-10-CM | POA: Diagnosis not present

## 2015-10-12 DIAGNOSIS — M7989 Other specified soft tissue disorders: Secondary | ICD-10-CM | POA: Diagnosis present

## 2015-10-12 DIAGNOSIS — F17210 Nicotine dependence, cigarettes, uncomplicated: Secondary | ICD-10-CM | POA: Insufficient documentation

## 2015-10-12 NOTE — ED Notes (Signed)
Patient arrived for ongoing leg edema. reports today was unable to ambulate out of the sun. patient seen multiple times for same complaint. States that he will be placed in nursing facility tomorrow

## 2015-10-12 NOTE — ED Notes (Signed)
PA at bedside.

## 2015-10-12 NOTE — Discharge Instructions (Signed)
Continue taking your home lasix as directed. Elevate your legs and use compression stockings to help with the swelling. Follow up with your regular doctor at the [facility] for ongoing management of your chronic leg swelling. Return to the ER for emergent changes or worsening symptoms.   Lymphedema Lymphedema is swelling that is caused by the abnormal collection of lymph under the skin. Lymph is fluid from the tissues in your body that travels in the lymphatic system. This system is part of the immune system and includes lymph nodes and lymph vessels. The lymph vessels collect and carry the excess fluid, fats, proteins, and wastes from the tissues of the body to the bloodstream. This system also works to clean and remove bacteria and waste products from the body. Lymphedema occurs when the lymphatic system is blocked. When the lymph vessels or lymph nodes are blocked or damaged, lymph does not drain properly, causing an abnormal buildup of lymph. This leads to swelling in the arms or legs. Lymphedema cannot be cured by medicines, but various methods can be used to help reduce the swelling. CAUSES There are two types of lymphedema. Primary lymphedema is caused by the absence or abnormality of the lymph vessel at birth. Secondary lymphedema is more common. It occurs when the lymph vessel is damaged or blocked. Common causes of lymph vessel blockage include:  Skin infection, such as cellulitis.  Infection by parasites (filariasis).  Injury.  Cancer.  Radiation therapy.  Formation of scar tissue.  Surgery. SYMPTOMS Symptoms of this condition include:  Swelling of the arm or leg.  A heavy or tight feeling in the arm or leg.  Swelling of the feet, toes, or fingers. Shoes or rings may fit more tightly than before.  Redness of the skin over the affected area.  Limited movement of the affected limb.  Sensitivity to touch or discomfort in the affected limb. DIAGNOSIS This condition may be  diagnosed with:  A physical exam.  Medical history.  Imaging tests, such as:  Lymphoscintigraphy. In this test, a low dose of a radioactive substance is injected to trace the flow of lymph through the lymph vessels.  MRI.  CT scan.  Duplex ultrasound. This test uses sound waves to produce images of the vessels and the blood flow on a screen.  Lymphangiography. In this test, a contrast dye is injected into the lymph vessel to help show blockages. TREATMENT Treatment for this condition may depend on the cause. Treatment may include:  Exercise. Certain exercises can help fluid move out of the affected limb.  Massage. Gentle massage of the affected limb can help move the fluid out of the area.  Compression. Various methods may be used to apply pressure to the affected limb in order to reduce the swelling.  Wearing compression stockings or sleeves on the affected limb.  Bandaging the affected limb.  Using an external pump that is attached to a sleeve that alternates between applying pressure and releasing pressure.  Surgery. This is usually only done for severe cases. For example, surgery may be done if you have trouble moving the limb or if the swelling does not get better with other treatments. If an underlying condition is causing the lymphedema, treatment for that condition is needed. For example, antibiotic medicines may be used to treat an infection. HOME CARE INSTRUCTIONS Activities  Exercise regularly as directed by your health care provider.  Do not sit with your legs crossed.  When possible, keep the affected limb raised (elevated) above the  level of your heart.  Avoid carrying things with an arm that is affected by lymphedema.  Remember that the affected area is more likely to become injured or infected.  Take these steps to help prevent infection:  Keep the affected area clean and dry.  Protect your skin from cuts. For example, you should use gloves while  cooking or gardening. Do not walk barefoot. If you shave the affected area, use an Neurosurgeon. General Instructions  Take medicines only as directed by your health care provider.  Eat a healthy diet that includes a lot of fruits and vegetables.  Do not wear tight clothes, shoes, or jewelry.  Do not use heating pads over the affected area.  Avoid having blood pressure checked on the affected limb.  Keep all follow-up visits as directed by your health care provider. This is important. SEEK MEDICAL CARE IF:  You continue to have swelling in your limb.  You have a fever.  You have a cut that does not heal.  You have redness or pain in the affected area.  You have new swelling in your limb that comes on suddenly.  You develop purplish spots or sores (lesions) on your limb. SEEK IMMEDIATE MEDICAL CARE IF:  You have a skin rash.  You have chills or sweats.  You have shortness of breath.   This information is not intended to replace advice given to you by your health care provider. Make sure you discuss any questions you have with your health care provider.   Document Released: 02/24/2007 Document Revised: 09/13/2014 Document Reviewed: 04/06/2014 Elsevier Interactive Patient Education [employer]
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
## 2015-10-16 ENCOUNTER — Telehealth: Payer: Self-pay | Admitting: Internal Medicine

## 2015-10-16 ENCOUNTER — Encounter: Payer: Self-pay | Admitting: Internal Medicine

## 2015-10-16 ENCOUNTER — Non-Acute Institutional Stay (SKILLED_NURSING_FACILITY): Payer: Medicaid Other | Admitting: Internal Medicine

## 2015-10-16 DIAGNOSIS — Z5900 Homelessness unspecified: Secondary | ICD-10-CM

## 2015-10-16 DIAGNOSIS — I63032 Cerebral infarction due to thrombosis of left carotid artery: Secondary | ICD-10-CM | POA: Diagnosis not present

## 2015-10-16 DIAGNOSIS — Z9114 Patient's other noncompliance with medication regimen: Secondary | ICD-10-CM

## 2015-10-16 DIAGNOSIS — M7501 Adhesive capsulitis of right shoulder: Secondary | ICD-10-CM

## 2015-10-16 DIAGNOSIS — I10 Essential (primary) hypertension: Secondary | ICD-10-CM

## 2015-10-16 DIAGNOSIS — I890 Lymphedema, not elsewhere classified: Secondary | ICD-10-CM | POA: Diagnosis not present
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
## 2015-10-16 NOTE — Telephone Encounter (Signed)
Crystal called to check on the form for home health orders she dropped off last week. Please f/u

## 2015-10-18 NOTE — Telephone Encounter (Signed)
MA spoke with crystal and is filling out another [facility] Form. MA will fax paperwork once completed and placed at the front desk for nurse to pick up hard copy. No further questions at this time.

## 2015-10-24 NOTE — Telephone Encounter (Signed)
Patient confirmed with PCP of no need for PCS due to patient being homeless.

## 2015-10-30 ENCOUNTER — Emergency Department (HOSPITAL_COMMUNITY)
Admission: EM | Admit: 2015-10-30 | Discharge: 2015-10-30 | Disposition: A | Discharge status: Home / Self Care | Payer: Medicaid Other | Attending: Emergency Medicine | Admitting: Emergency Medicine

## 2015-10-30 ENCOUNTER — Encounter (HOSPITAL_COMMUNITY): Payer: Self-pay | Admitting: Emergency Medicine

## 2015-10-30 DIAGNOSIS — R2243 Localized swelling, mass and lump, lower limb, bilateral: Secondary | ICD-10-CM | POA: Insufficient documentation

## 2015-10-30 DIAGNOSIS — I10 Essential (primary) hypertension: Secondary | ICD-10-CM | POA: Diagnosis not present

## 2015-10-30 DIAGNOSIS — Z8673 Personal history of transient ischemic attack (TIA), and cerebral infarction without residual deficits: Secondary | ICD-10-CM | POA: Insufficient documentation

## 2015-10-30 DIAGNOSIS — F17210 Nicotine dependence, cigarettes, uncomplicated: Secondary | ICD-10-CM | POA: Diagnosis not present

## 2015-10-30 DIAGNOSIS — R42 Dizziness and giddiness: Secondary | ICD-10-CM | POA: Diagnosis not present

## 2015-10-30 DIAGNOSIS — Z79899 Other long term (current) drug therapy: Secondary | ICD-10-CM | POA: Insufficient documentation

## 2015-10-30 DIAGNOSIS — Z7982 Long term (current) use of aspirin: Secondary | ICD-10-CM | POA: Diagnosis not present

## 2015-10-30 NOTE — ED Notes (Signed)
Per EMS-too hot outside
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
## 2015-11-22 NOTE — ED Provider Notes (Signed)
Medical screening examination/treatment/procedure(s) were performed by non-physician practitioner and as supervising physician I was immediately available for consultation/collaboration.   EKG Interpretation None       Jacalyn LefevreJulie Massie Cogliano, MD 11/22/15 [phone]

## 2015-12-06 DIAGNOSIS — M7989 Other specified soft tissue disorders: Secondary | ICD-10-CM | POA: Insufficient documentation

## 2015-12-06 DIAGNOSIS — I679 Cerebrovascular disease, unspecified: Secondary | ICD-10-CM | POA: Insufficient documentation

## 2015-12-06 DIAGNOSIS — I10 Essential (primary) hypertension: Secondary | ICD-10-CM | POA: Insufficient documentation

## 2015-12-06 DIAGNOSIS — I890 Lymphedema, not elsewhere classified: Secondary | ICD-10-CM | POA: Insufficient documentation

## 2016-02-02 ENCOUNTER — Encounter (HOSPITAL_COMMUNITY): Payer: Self-pay | Admitting: *Deleted

## 2016-02-02 ENCOUNTER — Emergency Department (HOSPITAL_COMMUNITY)
Admission: EM | Admit: 2016-02-02 | Discharge: 2016-02-02 | Disposition: A | Discharge status: Home / Self Care | Payer: No Typology Code available for payment source | Attending: Dermatology | Admitting: Dermatology

## 2016-02-02 DIAGNOSIS — I10 Essential (primary) hypertension: Secondary | ICD-10-CM | POA: Insufficient documentation

## 2016-02-02 DIAGNOSIS — Z5321 Procedure and treatment not carried out due to patient leaving prior to being seen by health care provider: Secondary | ICD-10-CM | POA: Insufficient documentation

## 2016-02-02 DIAGNOSIS — F17210 Nicotine dependence, cigarettes, uncomplicated: Secondary | ICD-10-CM | POA: Insufficient documentation

## 2016-02-02 DIAGNOSIS — M79606 Pain in leg, unspecified: Secondary | ICD-10-CM | POA: Insufficient documentation

## 2016-02-02 NOTE — ED Triage Notes (Signed)
Per EMS - patient presents from home with c/o bilateral LE edema that is chronic in nature.  Patient has not taken his Lasix in over a month.  Patient states the Lasix makes him urinate too often.  Patient denies CP, SOB and dizziness.  Patient's vitals WNL 124 palp, HR 94, RR 18 and 96% on RA.
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
## 2016-02-02 NOTE — ED Notes (Signed)
Pt called for v/s recheck, pt is not in lobby, restroom, or outside.

## 2016-02-03 ENCOUNTER — Emergency Department (HOSPITAL_COMMUNITY): Payer: Medicaid Other

## 2016-02-03 ENCOUNTER — Encounter (HOSPITAL_COMMUNITY): Payer: Self-pay

## 2016-02-03 ENCOUNTER — Inpatient Hospital Stay (HOSPITAL_COMMUNITY)
Admission: EM | Admit: 2016-02-03 | Discharge: 2016-02-08 | DRG: 607 | Disposition: A | Discharge status: Home / Self Care | Payer: Medicaid Other | Attending: Internal Medicine | Admitting: Internal Medicine

## 2016-02-03 DIAGNOSIS — Z5900 Homelessness unspecified: Secondary | ICD-10-CM

## 2016-02-03 DIAGNOSIS — Z8249 Family history of ischemic heart disease and other diseases of the circulatory system: Secondary | ICD-10-CM

## 2016-02-03 DIAGNOSIS — Z7982 Long term (current) use of aspirin: Secondary | ICD-10-CM

## 2016-02-03 DIAGNOSIS — F17210 Nicotine dependence, cigarettes, uncomplicated: Secondary | ICD-10-CM | POA: Diagnosis present

## 2016-02-03 DIAGNOSIS — Z6841 Body Mass Index (BMI) 40.0 and over, adult: Secondary | ICD-10-CM

## 2016-02-03 DIAGNOSIS — I890 Lymphedema, not elsewhere classified: Principal | ICD-10-CM | POA: Diagnosis present

## 2016-02-03 DIAGNOSIS — Z833 Family history of diabetes mellitus: Secondary | ICD-10-CM

## 2016-02-03 DIAGNOSIS — I10 Essential (primary) hypertension: Secondary | ICD-10-CM | POA: Diagnosis not present

## 2016-02-03 DIAGNOSIS — G47419 Narcolepsy without cataplexy: Secondary | ICD-10-CM | POA: Diagnosis present

## 2016-02-03 DIAGNOSIS — Z23 Encounter for immunization: Secondary | ICD-10-CM

## 2016-02-03 DIAGNOSIS — Z79899 Other long term (current) drug therapy: Secondary | ICD-10-CM

## 2016-02-03 DIAGNOSIS — D649 Anemia, unspecified: Secondary | ICD-10-CM | POA: Diagnosis present

## 2016-02-03 DIAGNOSIS — Z8673 Personal history of transient ischemic attack (TIA), and cerebral infarction without residual deficits: Secondary | ICD-10-CM

## 2016-02-03 DIAGNOSIS — K219 Gastro-esophageal reflux disease without esophagitis: Secondary | ICD-10-CM | POA: Diagnosis present

## 2016-02-03 LAB — BASIC METABOLIC PANEL
ANION GAP: 10 (ref 5–15)
BUN: 13 mg/dL (ref 6–20)
CALCIUM: 8.3 mg/dL — AB (ref 8.9–10.3)
CO2: 24 mmol/L (ref 22–32)
CREATININE: 0.92 mg/dL (ref 0.61–1.24)
Chloride: 108 mmol/L (ref 101–111)
GFR calc Af Amer: >60 mL/min (ref >60)
GLUCOSE: 92 mg/dL (ref 65–99)
Potassium: 4.1 mmol/L (ref 3.5–5.1)
Sodium: 142 mmol/L (ref 135–145)

## 2016-02-03 LAB — CBC WITH DIFFERENTIAL/PLATELET
Basophils Absolute: 0 10*3/uL (ref 0.0–0.1)
Basophils Relative: 1 %
EOS ABS: 0.1 10*3/uL (ref 0.0–0.7)
EOS PCT: 2 %
HCT: 35.5 % — ABNORMAL LOW (ref 39.0–52.0)
Hemoglobin: 11.2 g/dL — ABNORMAL LOW (ref 13.0–17.0)
LYMPHS ABS: 1.3 10*3/uL (ref 0.7–4.0)
Lymphocytes Relative: 18 %
MCH: 28.7 pg (ref 26.0–34.0)
MCHC: 31.5 g/dL (ref 30.0–36.0)
MCV: 91 fL (ref 78.0–100.0)
Monocytes Absolute: 0.6 10*3/uL (ref 0.1–1.0)
Monocytes Relative: 8 %
Neutro Abs: 4.8 10*3/uL (ref 1.7–7.7)
Neutrophils Relative %: 71 %
PLATELETS: 230 10*3/uL (ref 150–400)
RBC: 3.9 MIL/uL — AB (ref 4.22–5.81)
RDW: 15.9 % — ABNORMAL HIGH (ref 11.5–15.5)
WBC: 6.8 10*3/uL (ref 4.0–10.5)

## 2016-02-03 LAB — BRAIN NATRIURETIC PEPTIDE: B NATRIURETIC PEPTIDE 5: 24.1 pg/mL (ref 0.0–100.0)

## 2016-02-03 MED ORDER — ENOXAPARIN SODIUM 80 MG/0.8ML ~~LOC~~ SOLN
70.0000 mg | SUBCUTANEOUS | Status: DC
  Administered 2016-02-04 – 2016-02-06 (×3): 70 mg via SUBCUTANEOUS
  Filled 2016-02-03 (×4): qty 0.8

## 2016-02-03 MED ORDER — ASPIRIN 325 MG PO TABS
325.0000 mg | ORAL_TABLET | Freq: Every day | ORAL | Status: DC
  Administered 2016-02-04 – 2016-02-08 (×5): 325 mg via ORAL
  Filled 2016-02-03 (×5): qty 1

## 2016-02-03 MED ORDER — ALUM & MAG HYDROXIDE-SIMETH 200-200-20 MG/5ML PO SUSP
60.0000 mL | Freq: Once | ORAL | Status: AC
  Administered 2016-02-03: 60 mL via TOPICAL
  Filled 2016-02-03: qty 60

## 2016-02-03 MED ORDER — FUROSEMIDE 40 MG PO TABS
40.0000 mg | ORAL_TABLET | Freq: Two times a day (BID) | ORAL | Status: DC
  Administered 2016-02-04: 40 mg via ORAL
  Filled 2016-02-03: qty 1

## 2016-02-03 MED ORDER — CARVEDILOL 6.25 MG PO TABS
6.2500 mg | ORAL_TABLET | Freq: Two times a day (BID) | ORAL | Status: DC
  Administered 2016-02-04 – 2016-02-08 (×9): 6.25 mg via ORAL
  Filled 2016-02-03 (×9): qty 1
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
## 2016-02-03 NOTE — ED Triage Notes (Signed)
To room via EMS.  [place] he is no longer able to stay @ [place].  Onst today swelling to bilateral legs has increased and pain has increased today.  Pt rolling walker at bedside, brought by EMS.

## 2016-02-03 NOTE — Discharge Instructions (Signed)
If you were given medicines take as directed.  If you are on coumadin or contraceptives realize their levels and effectiveness is altered by many different medicines.  If you have any reaction (rash, tongues swelling, other) to the medicines stop taking and see a physician.    If your blood pressure was elevated in the ER make sure you follow up for management with a primary doctor or return for chest pain, shortness of breath or stroke symptoms.  Please follow up as directed and return to the ER or see a physician for new or worsening symptoms.  Thank you. Vitals:   02/03/16 1752 02/03/16 1816 02/03/16 2000 02/03/16 2134  BP:  159/96 120/72 121/76  Pulse:  97 89 94  Resp:  26 18 18   Temp:  98.7 F (37.1 C)    TempSrc:  Oral    SpO2: 95% 97% 95% 95%
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
## 2016-02-03 NOTE — ED Notes (Signed)
Discussed with Dr. Jodi MourningZavitz pts discharge plan.  Pt reports he is unable to bear weight on legs and is homeless.

## 2016-02-04 DIAGNOSIS — D649 Anemia, unspecified: Secondary | ICD-10-CM | POA: Diagnosis present

## 2016-02-04 DIAGNOSIS — I10 Essential (primary) hypertension: Secondary | ICD-10-CM | POA: Diagnosis not present

## 2016-02-04 DIAGNOSIS — R600 Localized edema: Secondary | ICD-10-CM | POA: Diagnosis not present

## 2016-02-04 DIAGNOSIS — G47419 Narcolepsy without cataplexy: Secondary | ICD-10-CM | POA: Diagnosis present

## 2016-02-04 DIAGNOSIS — Z79899 Other long term (current) drug therapy: Secondary | ICD-10-CM | POA: Diagnosis not present

## 2016-02-04 DIAGNOSIS — Z23 Encounter for immunization: Secondary | ICD-10-CM | POA: Diagnosis not present

## 2016-02-04 DIAGNOSIS — Z6841 Body Mass Index (BMI) 40.0 and over, adult: Secondary | ICD-10-CM | POA: Diagnosis not present

## 2016-02-04 DIAGNOSIS — K219 Gastro-esophageal reflux disease without esophagitis: Secondary | ICD-10-CM | POA: Diagnosis present

## 2016-02-04 DIAGNOSIS — Z833 Family history of diabetes mellitus: Secondary | ICD-10-CM | POA: Diagnosis not present

## 2016-02-04 DIAGNOSIS — Z7982 Long term (current) use of aspirin: Secondary | ICD-10-CM | POA: Diagnosis not present

## 2016-02-04 DIAGNOSIS — F17210 Nicotine dependence, cigarettes, uncomplicated: Secondary | ICD-10-CM | POA: Diagnosis not present

## 2016-02-04 DIAGNOSIS — Z590 Homelessness: Secondary | ICD-10-CM | POA: Diagnosis not present

## 2016-02-04 DIAGNOSIS — Z8673 Personal history of transient ischemic attack (TIA), and cerebral infarction without residual deficits: Secondary | ICD-10-CM | POA: Diagnosis not present

## 2016-02-04 DIAGNOSIS — M7989 Other specified soft tissue disorders: Secondary | ICD-10-CM | POA: Diagnosis present

## 2016-02-04 DIAGNOSIS — I890 Lymphedema, not elsewhere classified: Secondary | ICD-10-CM | POA: Diagnosis present

## 2016-02-04 DIAGNOSIS — Z8249 Family history of ischemic heart disease and other diseases of the circulatory system: Secondary | ICD-10-CM | POA: Diagnosis not present

## 2016-02-04 MED ORDER — FUROSEMIDE 10 MG/ML IJ SOLN
60.0000 mg | Freq: Three times a day (TID) | INTRAMUSCULAR | Status: AC
  Administered 2016-02-04 – 2016-02-06 (×8): 60 mg via INTRAVENOUS
  Filled 2016-02-04 (×8): qty 6

## 2016-02-04 MED ORDER — INFLUENZA VAC SPLIT QUAD 0.5 ML IM SUSY
0.5000 mL | PREFILLED_SYRINGE | INTRAMUSCULAR | Status: AC
  Administered 2016-02-05: 0.5 mL via INTRAMUSCULAR
  Filled 2016-02-04: qty 0.5

## 2016-02-04 MED ORDER — TRAMADOL HCL 50 MG PO TABS
50.0000 mg | ORAL_TABLET | Freq: Once | ORAL | Status: AC
  Administered 2016-02-04: 50 mg via ORAL
  Filled 2016-02-04: qty 1
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
## 2016-02-04 NOTE — Progress Notes (Signed)
[facility] PROGRESS NOTE  Joshua ClampRonald E Mccormick ZOX:[id number] DOB: 03/21/1966 DOA: 02/03/2016  PCP: Jaclyn ShaggyEnobong, Amao, MD  Brief History/Interval Summary: 50 year old African-American male with a past medical history significant mainly for chronic lower extremity lymphedema which has been quite severe. Patient presented with the complaints of leg discomfort. After evaluation in the ED, the was thought to be stable. However, was not considered safe for discharge as he is homeless and was unable to stand up. Hence, he was brought into the hospital.  Reason for Visit: Chronic lymphedema  Consultants: None  Procedures: None  Antibiotics: None  Subjective/Interval History: Patient states that his leg swelling is only slightly more worse than baseline. He tells me that he has not been taking any of his medications including the Lasix for the last 1 month, as he is not been able to afford them. He also tells me that he was in a skilled nursing facility till about a month ago when he was discharged, apparently due to some kind of insurance issues.  ROS: Denies nausea, vomiting, chest pain or shortness of breath.  Objective:  Vital Signs  Vitals:   02/03/16 2330 02/04/16 0113 02/04/16 0528 02/04/16 0832  BP: 123/68 (!) 152/90 135/68 126/64  Pulse: 81 80 81 83  Resp:  18    Temp:  98 F (36.7 C) 98.1 F (36.7 C)   TempSrc:  Oral Oral   SpO2: 94% 100% 100%   Weight:  136 kg (299 lb 13.2 oz)    Height:  5\' 9"  (1.753 m)      Intake/Output Summary (Last 24 hours) at 02/04/16 1238 Last data filed at 02/04/16 1054  Gross per 24 hour  Intake              700 ml  Output             1150 ml  Net             -450 ml   Filed Weights   02/04/16 0113  Weight: 136 kg (299 lb 13.2 oz)    General appearance: alert, cooperative, appears stated age and no distress Resp: clear to auscultation bilaterally Cardio: regular rate and rhythm, S1, S2 normal, no murmur, click, rub or  gallop GI: soft, non-tender; bowel sounds normal; no masses,  no organomegaly Extremities: Both legs are swollen due to his known history of lymphedema. No open wounds are noted. There is a blister over his right foot on the plantar aspect. Neurologic: Awake and alert. Oriented 3. No focal neurological deficits.  Lab Results:  Data Reviewed: I have personally reviewed following labs and imaging studies  CBC:  Recent Labs Lab 02/03/16 1942  WBC 6.8  NEUTROABS 4.8  HGB 11.2*  HCT 35.5*  MCV 91.0  PLT 230    Basic Metabolic Panel:  Recent Labs Lab 02/03/16 1942  NA 142  K 4.1  CL 108  CO2 24  GLUCOSE 92  BUN 13  CREATININE 0.92  CALCIUM 8.3*    GFR: Estimated Creatinine Clearance: 133 mL/min (by C-G formula based on SCr of 0.92 mg/dL).   Radiology Studies: Dg Chest 2 View  Result Date: 02/03/2016 CLINICAL DATA:  Lower extremity edema. EXAM: CHEST  2 VIEW COMPARISON:  September 08, 2015 FINDINGS: There is no edema or consolidation. Heart is borderline enlarged with pulmonary vascularity within normal limits. No adenopathy. No bone lesions. There is degenerative change in the thoracic spine. IMPRESSION: Borderline cardiomegaly.  No edema or consolidation. Electronically Signed  By: Bretta Bang III M.D.   On: 02/03/2016 20:44     Medications:  Scheduled: . aspirin  325 mg Oral Daily  . carvedilol  6.25 mg Oral BID WC  . enoxaparin (LOVENOX) injection  70 mg Subcutaneous Q24H  . furosemide  60 mg Intravenous Q8H  . furosemide  40 mg Oral BID  . [START ON 02/05/2016] Influenza vac split quadrivalent PF  0.5 mL Intramuscular Tomorrow-1000   Continuous:  PRN:  Assessment/Plan:  Principal Problem:   Homelessness Active Problems:   Essential hypertension   Chronic acquired lymphedema    Chronic bilateral lower extremity lymphedema Patient has not been on his Lasix for a month. He feels his legs are perhaps slightly more swollen due to this. He was  restarted on his Lasix. We will give him intravenous Lasix for now. And then changed to oral in the next day or so. PT and OT evaluation.  History of essential hypertension. Continue with home medications.  Homelessness. Social worker consult.  DVT Prophylaxis: Lovenox    Code Status: fullCode  Family Communication: This case with the patient  Disposition Plan: Management as outlined above. It appears the patient may have to be placed again into a nursing facility. The fact that he is homeless and might have no insurance will be a very big challenge. We will appreciate social worker and case management assistance with this case.     LOS: 0 days   [facility]  [facility] Pager [phone] 02/04/2016, 12:38 PM  If 7PM-7AM, please contact night-coverage at [email or web address], password [facility]
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
## 2016-02-05 LAB — BASIC METABOLIC PANEL
ANION GAP: 9 (ref 5–15)
BUN: 12 mg/dL (ref 6–20)
CALCIUM: 8.7 mg/dL — AB (ref 8.9–10.3)
CO2: 29 mmol/L (ref 22–32)
CREATININE: 0.79 mg/dL (ref 0.61–1.24)
Chloride: 100 mmol/L — ABNORMAL LOW (ref 101–111)
Glucose, Bld: 100 mg/dL — ABNORMAL HIGH (ref 65–99)
Potassium: 3.6 mmol/L (ref 3.5–5.1)
SODIUM: 138 mmol/L (ref 135–145)

## 2016-02-05 MED ORDER — POTASSIUM CHLORIDE CRYS ER 20 MEQ PO TBCR
20.0000 meq | EXTENDED_RELEASE_TABLET | Freq: Once | ORAL | Status: AC
  Administered 2016-02-05: 20 meq via ORAL
  Filled 2016-02-05: qty 1
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
## 2016-02-05 NOTE — Progress Notes (Signed)
[facility] PROGRESS NOTE  [name] JYN:[id number] DOB: 01/02/1966 DOA: 02/03/2016  PCP: [name]  Brief History/Interval Summary: 50 year old African-American male with a past medical history significant mainly for chronic lower extremity lymphedema which has been quite severe. Patient presented with the complaints of leg discomfort. After evaluation in the ED, the was thought to be stable. However, was not considered safe for discharge as he is homeless and was unable to stand up. Hence, he was brought into the hospital.  Reason for Visit: Chronic lymphedema  Consultants: None  Procedures: None  Antibiotics: None  Subjective/Interval History: Patient denies any complaints. He is making urine. Overall feels well.   ROS: Denies nausea, vomiting, chest pain or shortness of breath.  Objective:  Vital Signs  Vitals:   02/04/16 0832 02/04/16 1556 02/04/16 2012 02/05/16 0346  BP: 126/64 124/71 127/80 134/78  Pulse: 83 79 81 74  Resp:  18 18 18   Temp:  98.1 F (36.7 C) 98.6 F (37 C) 98.1 F (36.7 C)  TempSrc:  Oral Oral Oral  SpO2:  99% 99% 98%  Weight:      Height:        Intake/Output Summary (Last 24 hours) at 02/05/16 [id number] Last data filed at 02/05/16 0346  Gross per 24 hour  Intake             1750 ml  Output             4800 ml  Net            -3050 ml   Filed Weights   02/04/16 0113  Weight: 136 kg (299 lb 13.2 oz)    General appearance: alert, cooperative, appears stated age and no distress Resp: clear to auscultation bilaterally Cardio: regular rate and rhythm, S1, S2 normal, no murmur, click, rub or gallop GI: soft, non-tender; bowel sounds normal; no masses,  no organomegaly Extremities: Both legs are swollen due to his known history of lymphedema. No open wounds are noted. There is a blister over his right foot on the plantar aspect, covered with dressing. Neurologic: Awake and alert. Oriented 3. No focal neurological  deficits.  Lab Results:  Data Reviewed: I have personally reviewed following labs and imaging studies  CBC:  Recent Labs Lab 02/03/16 1942  WBC 6.8  NEUTROABS 4.8  HGB 11.2*  HCT 35.5*  MCV 91.0  PLT 230    Basic Metabolic Panel:  Recent Labs Lab 02/03/16 1942 02/05/16 0511  NA 142 138  K 4.1 3.6  CL 108 100*  CO2 24 29  GLUCOSE 92 100*  BUN 13 12  CREATININE 0.92 0.79  CALCIUM 8.3* 8.7*    GFR: Estimated Creatinine Clearance: 152.9 mL/min (by C-G formula based on SCr of 0.79 mg/dL).   Radiology Studies: Dg Chest 2 View  Result Date: 02/03/2016 CLINICAL DATA:  Lower extremity edema. EXAM: CHEST  2 VIEW COMPARISON:  September 08, 2015 FINDINGS: There is no edema or consolidation. Heart is borderline enlarged with pulmonary vascularity within normal limits. No adenopathy. No bone lesions. There is degenerative change in the thoracic spine. IMPRESSION: Borderline cardiomegaly.  No edema or consolidation. Electronically Signed   By: Bretta BangWilliam  Woodruff III M.D.   On: 02/03/2016 20:44     Medications:  Scheduled: . aspirin  325 mg Oral Daily  . carvedilol  6.25 mg Oral BID WC  . enoxaparin (LOVENOX) injection  70 mg Subcutaneous Q24H  . furosemide  60 mg Intravenous Q8H  .  Influenza vac split quadrivalent PF  0.5 mL Intramuscular Tomorrow-1000  . potassium chloride  20 mEq Oral Once   Continuous:  PRN:  Assessment/Plan:  Principal Problem:   Homelessness Active Problems:   Essential hypertension   Chronic acquired lymphedema    Chronic bilateral lower extremity lymphedema Patient has not been on his Lasix for a month. He feels his legs are perhaps slightly more swollen due to this. Patient was started on IV Lasix. He is diuresing well. Once plans for placement is in place. He can be transitioned to oral, hopefully in the next 24-48 hrs. PT and OT evaluation.  History of essential hypertension. Continue with home medications.  Homelessness. Social worker  consult.  DVT Prophylaxis: Lovenox    Code Status: fullCode  Family Communication: Discussed with the patient  Disposition Plan: Management as outlined above. PT and OT recommends placement to skilled nursing facility. Social worker is consulted.     LOS: 1 day   [facility]  [facility] Pager [phone] 02/05/2016, 9:21 AM  If 7PM-7AM, please contact night-coverage at [email or web address], password [facility]

## 2016-02-05 NOTE — Progress Notes (Signed)
Pt yells for help as he was found by NT buttocks down on the floor; it was an unwitnessed fall. He stated he tries to empty his urinal to the bathroom when he slipped from his chair to the floor. Vitals sign taken and recorded and within normal limits; range of motion of all extremities done and no complaints of acute pain or discomfort verbalized; skin checked for possible bruising and tears but nothing observed at the moment. No acute neurologic changes noted nor any complaints of nausea or vomiting. NP on call paged and notified of event and no additional orders given. Pt asked if there are family members to notify about incident but said none. Will continue to monitor.

## 2016-02-05 NOTE — NC FL2 (Signed)
  [place] MEDICAID FL2 LEVEL OF CARE SCREENING TOOL     IDENTIFICATION  Patient Name: [name] Birthdate: 06/22/1965 Sex: male Admission Date (Current Location): 02/03/2016  [facility] [place]   Facility and Address:  [facility], [address], [place], [place] [address]      Provider Number: [id number]  Attending Physician Name and Address:  Osvaldo ShipperGokul Krishnan, MD  Relative Name and Phone Number:       Current Level of Care: Hospital Recommended Level of Care: Skilled Nursing Facility Prior Approval Number:    Date Approved/Denied:   PASRR Number:    Discharge Plan: SNF    Current Diagnoses: Patient Active Problem List   Diagnosis Date Noted  . Noncompliance with medication regimen 08/21/2015  . Homelessness 04/24/2015  . Frozen shoulder 04/24/2015  . Right arm weakness   . Dizziness 03/26/2015  . Weakness 03/26/2015  . TIA (transient ischemic attack) 04/05/2014  . CVA (cerebral infarction) 04/05/2014  . Chronic acquired lymphedema 08/08/2012  . Obstructive sleep apnea 06/08/2009  . Dyslipidemia 01/17/2009  . GERD 12/08/2008  . ERECTILE DYSFUNCTION, ORGANIC 12/08/2008  . PERIPHERAL EDEMA 10/27/2008  . Essential hypertension 07/16/2007    Orientation RESPIRATION BLADDER Height & Weight     Self, Time, Situation, Place  Normal Continent Weight: 299 lb 13.2 oz (136 kg) Height:  5\' 9"  (175.3 cm)  BEHAVIORAL SYMPTOMS/MOOD NEUROLOGICAL BOWEL NUTRITION STATUS      Continent    AMBULATORY STATUS COMMUNICATION OF NEEDS Skin   Extensive Assist Verbally  (Dry Skin)                       Personal Care Assistance Level of Assistance  Bathing, Dressing Bathing Assistance: Maximum assistance   Dressing Assistance: Maximum assistance     Functional Limitations Info   (None)          SPECIAL CARE FACTORS FREQUENCY                       Contractures      Additional Factors Info   (Full)                Current Medications (02/05/2016):  This is the current hospital active medication list Current Facility-Administered Medications  Medication Dose Route Frequency Provider Last Rate Last Dose  . aspirin tablet 325 mg  325 mg Oral Daily Hillary BowJared M Gardner, DO   325 mg at 02/05/16 1003  . carvedilol (COREG) tablet 6.25 mg  6.25 mg Oral BID WC Hillary BowJared M Gardner, DO   6.25 mg at 02/05/16 1003  . enoxaparin (LOVENOX) injection 70 mg  70 mg Subcutaneous Q24H Hillary BowJared M Gardner, DO   70 mg at 02/05/16 1247  . furosemide (LASIX) injection 60 mg  60 mg Intravenous Q8H Osvaldo ShipperGokul Krishnan, MD   60 mg at 02/05/16 0543     Discharge Medications: Please see discharge summary for a list of discharge medications.  Relevant Imaging Results:  Relevant Lab Results:   Additional Information    [name], [name]

## 2016-02-06 LAB — BASIC METABOLIC PANEL
ANION GAP: 9 (ref 5–15)
BUN: 16 mg/dL (ref 6–20)
CALCIUM: 8.9 mg/dL (ref 8.9–10.3)
CO2: 32 mmol/L (ref 22–32)
Chloride: 96 mmol/L — ABNORMAL LOW (ref 101–111)
Creatinine, Ser: 0.93 mg/dL (ref 0.61–1.24)
Glucose, Bld: 105 mg/dL — ABNORMAL HIGH (ref 65–99)
POTASSIUM: 4 mmol/L (ref 3.5–5.1)
SODIUM: 137 mmol/L (ref 135–145)

## 2016-02-06 MED ORDER — FUROSEMIDE 40 MG PO TABS
40.0000 mg | ORAL_TABLET | Freq: Two times a day (BID) | ORAL | Status: DC
  Administered 2016-02-07: 40 mg via ORAL
  Filled 2016-02-06: qty 1
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
## 2016-02-06 NOTE — Progress Notes (Addendum)
SW spoke with [name] Chiropodistassistant director and staffed pt case. He states that he will reach out to Nurse CM to further speak about pt in order to determine disposition.  Crista CurbBrittney Jazyiah Yiu, MSW [phone]

## 2016-02-06 NOTE — Progress Notes (Addendum)
[facility] PROGRESS NOTE  [name] ZOX:[id number] DOB: [birth year] DOA: 02/03/2016  PCP: Jaclyn Shaggy, MD  Brief History/Interval Summary: 50 year old African-American male with a past medical history significant mainly for chronic lower extremity lymphedema which has been quite severe. Patient presented with the complaints of leg discomfort. After evaluation in the ED, the was thought to be stable. However, was not considered safe for discharge as he is homeless and was unable to stand up. Hence, he was brought into the hospital.  Reason for Visit: Chronic lymphedema  Consultants: None  Procedures: None  Antibiotics: None  Subjective/Interval History: Patient denies any complaints. He continues to make urine. Overall feels well. States legs are less swollen. Had a mechanical fall last night. Denies any injuries.  ROS: Denies nausea, vomiting, chest pain or shortness of breath.  Objective:  Vital Signs  Vitals:   02/05/16 1617 02/05/16 2039 02/05/16 2114 02/06/16 0632  BP: (!) 141/79 127/84 (!) 125/103 125/88  Pulse: 74 89 90 86  Resp: 16 16 18 16   Temp: 97.7 F (36.5 C) 98.2 F (36.8 C)  97.5 F (36.4 C)  TempSrc:  Oral  Oral  SpO2: 100% 98% 100% 100%  Weight:      Height:        Intake/Output Summary (Last 24 hours) at 02/06/16 1351 Last data filed at 02/06/16 1134  Gross per 24 hour  Intake              720 ml  Output             2440 ml  Net            -1720 ml   Filed Weights   02/04/16 0113  Weight: 136 kg (299 lb 13.2 oz)    General appearance: alert, cooperative, appears stated age and no distress Resp: clear to auscultation bilaterally Cardio: regular rate and rhythm, S1, S2 normal, no murmur, click, rub or gallop GI: soft, non-tender; bowel sounds normal; no masses,  no organomegaly Extremities: Both legs are swollen due to his known history of lymphedema. No open wounds are noted. There is a blister over his right foot on the  plantar aspect, covered with dressing. Neurologic: Awake and alert. Oriented 3. No focal neurological deficits.  Lab Results:  Data Reviewed: I have personally reviewed following labs and imaging studies  CBC:  Recent Labs Lab 02/03/16 1942  WBC 6.8  NEUTROABS 4.8  HGB 11.2*  HCT 35.5*  MCV 91.0  PLT 230    Basic Metabolic Panel:  Recent Labs Lab 02/03/16 1942 02/05/16 0511 02/06/16 0324  NA 142 138 137  K 4.1 3.6 4.0  CL 108 100* 96*  CO2 24 29 32  GLUCOSE 92 100* 105*  BUN 13 12 16   CREATININE 0.92 0.79 0.93  CALCIUM 8.3* 8.7* 8.9    GFR: Estimated Creatinine Clearance: 131.6 mL/min (by C-G formula based on SCr of 0.93 mg/dL).   Radiology Studies: No results found.   Medications:  Scheduled: . aspirin  325 mg Oral Daily  . carvedilol  6.25 mg Oral BID WC  . enoxaparin (LOVENOX) injection  70 mg Subcutaneous Q24H  . furosemide  60 mg Intravenous Q8H   Continuous:  PRN:  Assessment/Plan:  Principal Problem:   Homelessness Active Problems:   Essential hypertension   Chronic acquired lymphedema    Chronic bilateral lower extremity lymphedema Patient has not been on his Lasix for a month. He feels his legs are perhaps slightly more  swollen due to this. Patient was started on IV Lasix. He is diuresing well. Change to oral from tomorrow. PT and OT evaluation. Needs SNF. [name] is following.  History of essential hypertension. Continue with home medications.  Homelessness. Social worker consult.  Morbid Obesity BMI of 44.28. Needs weight loss counseling eventually.  DVT Prophylaxis: Lovenox    Code Status: fullCode  Family Communication: Discussed with the patient  Disposition Plan: Management as outlined above. PT and OT recommends placement to skilled nursing facility. Social worker is consulted. His lack of insurance and homeless status present challenges to appropriate disposition.     LOS: 2 days   [facility]ISHNA[facility],Lyndsay Talamante  [facility] Pager [phone] 02/06/2016, 1:51 PM  If 7PM-7AM, please contact night-coverage at [email or web address], password [email or web address]

## 2016-02-07 DIAGNOSIS — I890 Lymphedema, not elsewhere classified: Principal | ICD-10-CM

## 2016-02-07 DIAGNOSIS — I10 Essential (primary) hypertension: Secondary | ICD-10-CM

## 2016-02-07 DIAGNOSIS — Z590 Homelessness: Secondary | ICD-10-CM

## 2016-02-07 LAB — CBC
HCT: 40.9 % (ref 39.0–52.0)
Hemoglobin: 13.2 g/dL (ref 13.0–17.0)
MCH: 29.4 pg (ref 26.0–34.0)
MCHC: 32.3 g/dL (ref 30.0–36.0)
MCV: 91.1 fL (ref 78.0–100.0)
PLATELETS: 288 10*3/uL (ref 150–400)
RBC: 4.49 MIL/uL (ref 4.22–5.81)
RDW: 15.6 % — ABNORMAL HIGH (ref 11.5–15.5)
WBC: 5.5 10*3/uL (ref 4.0–10.5)

## 2016-02-07 LAB — BASIC METABOLIC PANEL
Anion gap: 13 (ref 5–15)
BUN: 18 mg/dL (ref 6–20)
CALCIUM: 9.3 mg/dL (ref 8.9–10.3)
CO2: 31 mmol/L (ref 22–32)
CREATININE: 0.87 mg/dL (ref 0.61–1.24)
Chloride: 94 mmol/L — ABNORMAL LOW (ref 101–111)
GFR calc Af Amer: >60 mL/min (ref >60)
Glucose, Bld: 113 mg/dL — ABNORMAL HIGH (ref 65–99)
Potassium: 4.4 mmol/L (ref 3.5–5.1)
SODIUM: 138 mmol/L (ref 135–145)

## 2016-02-07 MED ORDER — OXYCODONE-ACETAMINOPHEN 5-325 MG PO TABS
2.0000 | ORAL_TABLET | Freq: Once | ORAL | Status: AC
  Administered 2016-02-07: 2 via ORAL
  Filled 2016-02-07: qty 2

## 2016-02-07 MED ORDER — FUROSEMIDE 10 MG/ML IJ SOLN: 60.0000 mg | Freq: Three times a day (TID) | INTRAMUSCULAR | Status: DC

## 2016-02-07 MED ORDER — FUROSEMIDE 40 MG PO TABS
40.0000 mg | ORAL_TABLET | Freq: Two times a day (BID) | ORAL | Status: DC
  Administered 2016-02-08: 40 mg via ORAL
  Filled 2016-02-07: qty 1

## 2016-02-07 NOTE — Progress Notes (Addendum)
SW reached out to Artistinancial Counselor at [phone] and left a message requesting for someone from their department to come and speak with patient. Also, SW reached [facility] to [facility], both state that they do not have any open beds for tonight. They state to call tomorrow for an update regarding bed openings.  Crista CurbBrittney Dotti Busey, MSW [phone] 02/07/2016 3:22 PM
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
## 2016-02-07 NOTE — Progress Notes (Signed)
PT Cancellation Note  Patient Details Name: Shawna ClampRonald E Burress MRN: [id number] DOB: 10/02/1965   Cancelled Treatment:    Reason Eval/Treat Not Completed: Patient declined, no reason specified;Pain limiting ability to participate. Pt declining therapy session at this time secondary to 7/10 "shooting pain" in R LE, from foot to knee as pt describes. Pt reported that he was anxious to ambulate at this time as he was worried the pain would present and cause him to fall. PT notified pt's nurse of request for pain meds. PT will continue to f/u with pt as appropriate.   Alessandra BevelsJennifer M Taia Bramlett 02/07/2016, 5:05 PM Deborah ChalkJennifer Jeanice Dempsey, PT, DPT [phone]

## 2016-02-07 NOTE — Discharge Summary (Signed)
Physician Discharge Summary  [name] ZOX:[id number] DOB: 03/30/1966 DOA: 02/03/2016  PCP: Jaclyn ShaggyEnobong, Amao, MD  Admit date: 02/03/2016 Discharge date: 02/07/2016  Admitted From: Homeless Disposition: Homeless with [facility] support.  Recommendations for Outpatient Follow-up:  1. Follow up with PCP in 1-2 weeks 2. Please obtain BMP/CBC in one week  Home Health: NA Equipment/Devices: NA  Discharge Condition: Stble CODE STATUS:Full  Diet recommendation: Heart Healthy  Brief/Interim Summary: 50 year old African-American male with a past medical history significant mainly for chronic lower extremity lymphedema which has been quite severe. Patient presented with the complaints of leg discomfort. After evaluation in the ED, the was thought to be stable. However, was not considered safe for discharge as he is homeless and was unable to stand up. Hence, he was brought into the hospital.  Discharge Diagnoses:  Principal Problem:   Homelessness Active Problems:   Essential hypertension   Chronic acquired lymphedema   Chronic bilateral lower extremity lymphedema Patient has not been on his Lasix for a month. He feels his legs are perhaps slightly more swollen due to this. . Lower extremities kept elevated, treated with IV diuresis as well as in the hospital. No evidence of cellulitis.  History of essential hypertension. Continue with home medications.  Homelessness. [name] consulted, patient continued to be homeless. He instructed the social worker not to call his family. He opted to go back to what he was doing, to local shelter versus treat with support of [facility].  Morbid Obesity BMI of 44.28. Needs weight loss counseling eventually.  Discharge Instructions  Discharge Instructions    Diet - low sodium heart healthy    Complete by:  As directed    Increase activity slowly    Complete by:  As directed        Medication List    TAKE these medications   aspirin 325 MG  tablet Take 1 tablet (325 mg total) by mouth daily.   carvedilol 6.25 MG tablet Commonly known as:  COREG Take 1 tablet (6.25 mg total) by mouth 2 (two) times daily with a meal.   furosemide 40 MG tablet Commonly known as:  LASIX Take 1 tablet (40 mg total) by mouth 2 (two) times daily.      Follow-up Information    Jaclyn ShaggyEnobong, Amao, MD. Call in 3 day(s).   Specialty:  Family Medicine Contact information: [address] [place] [address] [phone]        [facility].   Specialty:  Emergency Medicine Why:  As needed, If symptoms worsen Contact information: [address] [id number] mc [place] [phone]         No Known Allergies  Consultations:  None   Procedures/Studies: Dg Chest 2 View  Result Date: 02/03/2016 CLINICAL DATA:  Lower extremity edema. EXAM: CHEST  2 VIEW COMPARISON:  September 08, 2015 FINDINGS: There is no edema or consolidation. Heart is borderline enlarged with pulmonary vascularity within normal limits. No adenopathy. No bone lesions. There is degenerative change in the thoracic spine. IMPRESSION: Borderline cardiomegaly.  No edema or consolidation. Electronically Signed   By: Bretta BangWilliam  Woodruff III M.D.   On: 02/03/2016 20:44    (Echo, Carotid, EGD, Colonoscopy, ERCP)    Subjective:   Discharge Exam: Vitals:   02/07/16 0451 02/07/16 0815  BP: 119/74 126/81  Pulse: 79 90  Resp: 17   Temp: 98 F (36.7 C)    Vitals:   02/06/16 1430 02/06/16 2155 02/07/16 0451 02/07/16  0815  BP: 127/72 122/70 119/74 126/81  Pulse: 86 79 79 90  Resp: 16 17 17    Temp: 98.2 F (36.8 C) 98.8 F (37.1 C) 98 F (36.7 C)   TempSrc:  Oral Oral   SpO2: 96% 98% 100% 100%  Weight:      Height:        General: Pt is alert, awake, not in acute distress Cardiovascular: RRR, S1/S2 +, no rubs, no gallops Respiratory: CTA bilaterally, no wheezing, no rhonchi Abdominal: Soft,  NT, ND, bowel sounds + Extremities: no edema, no cyanosis    The results of significant diagnostics from this hospitalization (including imaging, microbiology, ancillary and laboratory) are listed below for reference.     Microbiology: No results found for this or any previous visit (from the past 240 hour(s)).   Labs: BNP (last 3 results)  Recent Labs  07/14/15 2253 09/08/15 1655 02/03/16 1907  BNP 16.3 19.4 24.1   Basic Metabolic Panel:  Recent Labs Lab 02/03/16 1942 02/05/16 0511 02/06/16 0324 02/07/16 0618  NA 142 138 137 138  K 4.1 3.6 4.0 4.4  CL 108 100* 96* 94*  CO2 24 29 32 31  GLUCOSE 92 100* 105* 113*  BUN 13 12 16 18   CREATININE 0.92 0.79 0.93 0.87  CALCIUM 8.3* 8.7* 8.9 9.3   Liver Function Tests: No results for input(s): AST, ALT, ALKPHOS, BILITOT, PROT, ALBUMIN in the last 168 hours. No results for input(s): LIPASE, AMYLASE in the last 168 hours. No results for input(s): AMMONIA in the last 168 hours. CBC:  Recent Labs Lab 02/03/16 1942 02/07/16 0618  WBC 6.8 5.5  NEUTROABS 4.8  --   HGB 11.2* 13.2  HCT 35.5* 40.9  MCV 91.0 91.1  PLT 230 288   Cardiac Enzymes: No results for input(s): CKTOTAL, CKMB, CKMBINDEX, TROPONINI in the last 168 hours. BNP: Invalid input(s): POCBNP CBG: No results for input(s): GLUCAP in the last 168 hours. D-Dimer No results for input(s): DDIMER in the last 72 hours. Hgb A1c No results for input(s): HGBA1C in the last 72 hours. Lipid Profile No results for input(s): CHOL, HDL, LDLCALC, TRIG, CHOLHDL, LDLDIRECT in the last 72 hours. Thyroid function studies No results for input(s): TSH, T4TOTAL, T3FREE, THYROIDAB in the last 72 hours.  Invalid input(s): FREET3 Anemia work up No results for input(s): VITAMINB12, FOLATE, FERRITIN, TIBC, IRON, RETICCTPCT in the last 72 hours. Urinalysis    Component Value Date/Time   COLORURINE YELLOW 09/13/2015 1949   APPEARANCEUR CLEAR 09/13/2015 1949   LABSPEC 1.019  09/13/2015 1949   PHURINE 7.0 09/13/2015 1949   GLUCOSEU NEGATIVE 09/13/2015 1949   HGBUR NEGATIVE 09/13/2015 1949   HGBUR negative 02/24/2009 1100   BILIRUBINUR NEGATIVE 09/13/2015 1949   KETONESUR NEGATIVE 09/13/2015 1949   PROTEINUR NEGATIVE 09/13/2015 1949   UROBILINOGEN 1.0 03/26/2015 2036   NITRITE NEGATIVE 09/13/2015 1949   LEUKOCYTESUR NEGATIVE 09/13/2015 1949   Sepsis Labs Invalid input(s): PROCALCITONIN,  WBC,  LACTICIDVEN Microbiology No results found for this or any previous visit (from the past 240 hour(s)).   Time coordinating discharge: Over 30 minutes  SIGNED:   Clint Lipps, MD  [facility] 02/07/2016, 12:34 PM Pager   If 7PM-7AM, please contact night-coverage [email or web address] Password [email or web address]

## 2016-02-07 NOTE — Progress Notes (Signed)
[facility] PROGRESS NOTE  Joshua ClampRonald E Mccormick UJW:[id number] DOB: 05/10/1966 DOA: 02/03/2016  PCP: Jaclyn ShaggyEnobong, Amao, MD  Brief History/Interval Summary: 50 year old African-American male with a past medical history significant mainly for chronic lower extremity lymphedema which has been quite severe. Patient presented with the complaints of leg discomfort. After evaluation in the ED, the was thought to be stable. However, was not considered safe for discharge as he is homeless and was unable to stand up. Hence, he was brought into the hospital.  Reason for Visit: Chronic lymphedema  Consultants: None  Procedures: None  Antibiotics: None  Subjective/Interval History: No new complaints, discussed with the [name] at bedside, working on disposition.  Likely will go home with [facility] father [facility]  ROS: Denies nausea, vomiting, chest pain or shortness of breath.  Objective:  Vital Signs  Vitals:   02/06/16 1430 02/06/16 2155 02/07/16 0451 02/07/16 0815  BP: 127/72 122/70 119/74 126/81  Pulse: 86 79 79 90  Resp: 16 17 17    Temp: 98.2 F (36.8 C) 98.8 F (37.1 C) 98 F (36.7 C)   TempSrc:  Oral Oral   SpO2: 96% 98% 100% 100%  Weight:      Height:        Intake/Output Summary (Last 24 hours) at 02/07/16 1159 Last data filed at 02/07/16 0600  Gross per 24 hour  Intake              480 ml  Output             2450 ml  Net            -1970 ml   Filed Weights   02/04/16 0113  Weight: 136 kg (299 lb 13.2 oz)    General appearance: alert, cooperative, appears stated age and no distress Resp: clear to auscultation bilaterally Cardio: regular rate and rhythm, S1, S2 normal, no murmur, click, rub or gallop GI: soft, non-tender; bowel sounds normal; no masses,  no organomegaly Extremities: Both legs are swollen due to his known history of lymphedema. No open wounds are noted. There is a blister over his right foot on the plantar aspect, covered with  dressing. Neurologic: Awake and alert. Oriented 3. No focal neurological deficits.  Lab Results:  Data Reviewed: I have personally reviewed following labs and imaging studies  CBC:  Recent Labs Lab 02/03/16 1942 02/07/16 0618  WBC 6.8 5.5  NEUTROABS 4.8  --   HGB 11.2* 13.2  HCT 35.5* 40.9  MCV 91.0 91.1  PLT 230 288    Basic Metabolic Panel:  Recent Labs Lab 02/03/16 1942 02/05/16 0511 02/06/16 0324 02/07/16 0618  NA 142 138 137 138  K 4.1 3.6 4.0 4.4  CL 108 100* 96* 94*  CO2 24 29 32 31  GLUCOSE 92 100* 105* 113*  BUN 13 12 16 18   CREATININE 0.92 0.79 0.93 0.87  CALCIUM 8.3* 8.7* 8.9 9.3    GFR: Estimated Creatinine Clearance: 140.6 mL/min (by C-G formula based on SCr of 0.87 mg/dL).   Radiology Studies: No results found.   Medications:  Scheduled: . aspirin  325 mg Oral Daily  . carvedilol  6.25 mg Oral BID WC  . enoxaparin (LOVENOX) injection  70 mg Subcutaneous Q24H  . furosemide  40 mg Oral BID   Continuous:  PRN:  Assessment/Plan:  Principal Problem:   Homelessness Active Problems:   Essential hypertension   Chronic acquired lymphedema    Chronic bilateral lower extremity lymphedema Patient has  not been on his Lasix for a month. He feels his legs are perhaps slightly more swollen due to this. Marland Kitchen Keep lower extremity elevated, continue IV Lasix till discharge. No evidence of cellulitis.  History of essential hypertension. Continue with home medications.  Homelessness. [name] consulted, pulmonary discussion she is waiting on his father to call back versus going to [facility].  Morbid Obesity BMI of 44.28. Needs weight loss counseling eventually.  DVT Prophylaxis: Lovenox    Code Status: fullCode  Family Communication: Discussed with the patient  Disposition Plan: Await [name] recommendation     LOS: 3 days   [facility] A  [facility] Pager [phone] 02/07/2016, 11:59 AM  If 7PM-7AM, please contact night-coverage at  [email or web address], password [facility]

## 2016-02-07 NOTE — Progress Notes (Addendum)
SW met with patient at bedside. Patient was alert and oriented. There was no family present. SW offered to call dad or uncle in order to speak with family about support and if pt was welcomed to come to their home. However, the patient declined the offer. He states that he does not want SW to reach out to family.   SW provided patient with community resources and spoke with him about the [facility]. SW WILL provide patient with a taxi voucher to get to [facility]. SW will call [facility] and arranged pick up time transportation once the pt is up for discharge.  Patient has no questions for SW at this time.  Tilda Burrow, MSW [phone] 02/07/2016 12:24 PM

## 2016-02-08 ENCOUNTER — Encounter (HOSPITAL_COMMUNITY): Payer: Self-pay

## 2016-02-08 ENCOUNTER — Emergency Department (HOSPITAL_COMMUNITY)
Admission: EM | Admit: 2016-02-08 | Discharge: 2016-02-08 | Disposition: A | Discharge status: Home / Self Care | Payer: Medicaid Other | Attending: Physician Assistant | Admitting: Physician Assistant

## 2016-02-08 DIAGNOSIS — I10 Essential (primary) hypertension: Secondary | ICD-10-CM | POA: Insufficient documentation

## 2016-02-08 DIAGNOSIS — R600 Localized edema: Secondary | ICD-10-CM | POA: Insufficient documentation

## 2016-02-08 DIAGNOSIS — Z7982 Long term (current) use of aspirin: Secondary | ICD-10-CM | POA: Insufficient documentation

## 2016-02-08 DIAGNOSIS — F17210 Nicotine dependence, cigarettes, uncomplicated: Secondary | ICD-10-CM | POA: Insufficient documentation

## 2016-02-08 DIAGNOSIS — Z8673 Personal history of transient ischemic attack (TIA), and cerebral infarction without residual deficits: Secondary | ICD-10-CM | POA: Insufficient documentation

## 2016-02-08 LAB — BASIC METABOLIC PANEL
ANION GAP: 6 (ref 5–15)
BUN: 16 mg/dL (ref 6–20)
CALCIUM: 9.1 mg/dL (ref 8.9–10.3)
CO2: 34 mmol/L — ABNORMAL HIGH (ref 22–32)
Chloride: 97 mmol/L — ABNORMAL LOW (ref 101–111)
Creatinine, Ser: 0.79 mg/dL (ref 0.61–1.24)
GFR calc Af Amer: >60 mL/min (ref >60)
GLUCOSE: 98 mg/dL (ref 65–99)
POTASSIUM: 4.2 mmol/L (ref 3.5–5.1)
SODIUM: 137 mmol/L (ref 135–145)

## 2016-02-08 LAB — CBC
HCT: 39.5 % (ref 39.0–52.0)
Hemoglobin: 12.6 g/dL — ABNORMAL LOW (ref 13.0–17.0)
MCH: 29.2 pg (ref 26.0–34.0)
MCHC: 31.9 g/dL (ref 30.0–36.0)
MCV: 91.4 fL (ref 78.0–100.0)
PLATELETS: 311 10*3/uL (ref 150–400)
RBC: 4.32 MIL/uL (ref 4.22–5.81)
RDW: 15.3 % (ref 11.5–15.5)
WBC: 5.9 10*3/uL (ref 4.0–10.5)

## 2016-02-08 NOTE — Care Management (Signed)
Case manager has worked with Child psychotherapistocial worker on discharge for patient. [facility]. Patient unfortunately has exhausted all options for assistance with family and has no medical intensity for remaining in hospital per medical Advisor. Patient has been ambulating in the room independently with walker, states he has a walker already. .[name]

## 2016-02-08 NOTE — ED Triage Notes (Signed)
Pt was BIB [facility] after being released from here today. Pt states that his legs are still swollen bilaterally. Causing him not to be able to walk or care for himself. A&Ox4. 128/70, 104, 16, CBG115. Denies pain with EMS.

## 2016-02-08 NOTE — Progress Notes (Signed)
[name] to be D/C'd Home per MD order.  Discussed with the patient and all questions fully answered.  VSS  IV catheter discontinued intact. Site without signs and symptoms of complications. Dressing and pressure applied.  An After Visit Summary was printed and given to the patient. Patient received prescription.  D/c education completed with patient/family including follow up instructions, medication list, d/c activities limitations if indicated, with other d/c instructions as indicated by MD - patient able to verbalize understanding, all questions fully answered.   Patient instructed to return to ED, call 911, or call MD for any changes in condition.   Patient escorted via WC, and D/C with taxi voucher set up for 1330  L'ESPERANCE, [name] C 02/08/2016 12:53 PM

## 2016-02-08 NOTE — Discharge Summary (Signed)
Physician Discharge Summary  [name] ZOX:[id number] DOB: 1965/12/04 DOA: 02/03/2016  PCP: Jaclyn Shaggy, MD  Admit date: 02/03/2016 Discharge date: 02/08/2016  Admitted From: Homeless Disposition: Homeless with IRC support.  Recommendations for Outpatient Follow-up:  1. Follow up with PCP in 1-2 weeks 2. Please obtain BMP/CBC in one week  Home Health: NA Equipment/Devices: NA  Discharge Condition: Stble CODE STATUS:Full  Diet recommendation: Heart Healthy  Brief/Interim Summary: 50 year old African-American male with a past medical history significant mainly for chronic lower extremity lymphedema which has been quite severe. Patient presented with the complaints of leg discomfort. After evaluation in the ED, the was thought to be stable. However, was not considered safe for discharge as he is homeless and was unable to stand up. Hence, he was brought into the hospital.  Discharge Diagnoses:  Principal Problem:   Homelessness Active Problems:   Essential hypertension   Chronic acquired lymphedema   Chronic bilateral lower extremity lymphedema Patient has not been on his Lasix for a month. He feels his legs are perhaps slightly more swollen due to this.  Reported that he is not adherent with Lasix because he doesn't have immediate access to bathrooms. Lower extremities kept elevated, treated with IV diuresis while he was in the hospital. No evidence of cellulitis.  History of essential hypertension. Continue with home medications.  Homelessness [name] consulted, patient continued to be homeless. He instructed the social worker not to call his family. He opted to go back to what he was doing, to local shelter versus treat with support of [facility]. Patient has recurrent admissions to the hospital and emergency department. He applied and waiting for disability paperwork.  Morbid Obesity BMI of 44.28. Needs weight loss counseling eventually.  Discharge  Instructions  Discharge Instructions    Diet - low sodium heart healthy    Complete by:  As directed    Increase activity slowly    Complete by:  As directed        Medication List    TAKE these medications   aspirin 325 MG tablet Take 1 tablet (325 mg total) by mouth daily.   carvedilol 6.25 MG tablet Commonly known as:  COREG Take 1 tablet (6.25 mg total) by mouth 2 (two) times daily with a meal.   furosemide 40 MG tablet Commonly known as:  LASIX Take 1 tablet (40 mg total) by mouth 2 (two) times daily.      Follow-up Information    Jaclyn Shaggy, MD. Call in 3 day(s).   Specialty:  Family Medicine Contact information: [address] [place] [address] [phone]        [facility] EMERGENCY DEPARTMENT.   Specialty:  Emergency Medicine Why:  As needed, If symptoms worsen Contact information: [address] [id number] mc [place] [address] [phone]         No Known Allergies  Consultations:  None   Procedures/Studies: Dg Chest 2 View  Result Date: 02/03/2016 CLINICAL DATA:  Lower extremity edema. EXAM: CHEST  2 VIEW COMPARISON:  September 08, 2015 FINDINGS: There is no edema or consolidation. Heart is borderline enlarged with pulmonary vascularity within normal limits. No adenopathy. No bone lesions. There is degenerative change in the thoracic spine. IMPRESSION: Borderline cardiomegaly.  No edema or consolidation. Electronically Signed   By: Bretta Bang III M.D.   On: 02/03/2016 20:44   (Echo, Carotid, EGD, Colonoscopy, ERCP)    Subjective:   Discharge Exam: Vitals:   02/08/16 0437 02/08/16  0857  BP: (!) 145/80 (!) 144/94  Pulse: 79 80  Resp: 18   Temp: 97.3 F (36.3 C)    Vitals:   02/07/16 1500 02/07/16 2252 02/08/16 0437 02/08/16 0857  BP: 133/88 133/71 (!) 145/80 (!) 144/94  Pulse: 81 84 79 80  Resp: 18 20 18    Temp: 97.6 F (36.4 C) 97.7 F (36.5 C) 97.3 F (36.3 C)    TempSrc:  Oral Oral   SpO2: 100% 98% 99%   Weight:      Height:        General: Pt is alert, awake, not in acute distress Cardiovascular: RRR, S1/S2 +, no rubs, no gallops Respiratory: CTA bilaterally, no wheezing, no rhonchi Abdominal: Soft, NT, ND, bowel sounds + Extremities: no edema, no cyanosis    The results of significant diagnostics from this hospitalization (including imaging, microbiology, ancillary and laboratory) are listed below for reference.     Microbiology: No results found for this or any previous visit (from the past 240 hour(s)).   Labs: BNP (last 3 results)  Recent Labs  07/14/15 2253 09/08/15 1655 02/03/16 1907  BNP 16.3 19.4 24.1   Basic Metabolic Panel:  Recent Labs Lab 02/03/16 1942 02/05/16 0511 02/06/16 0324 02/07/16 0618 02/08/16 0346  NA 142 138 137 138 137  K 4.1 3.6 4.0 4.4 4.2  CL 108 100* 96* 94* 97*  CO2 24 29 32 31 34*  GLUCOSE 92 100* 105* 113* 98  BUN 13 12 16 18 16   CREATININE 0.92 0.79 0.93 0.87 0.79  CALCIUM 8.3* 8.7* 8.9 9.3 9.1   Liver Function Tests: No results for input(s): AST, ALT, ALKPHOS, BILITOT, PROT, ALBUMIN in the last 168 hours. No results for input(s): LIPASE, AMYLASE in the last 168 hours. No results for input(s): AMMONIA in the last 168 hours. CBC:  Recent Labs Lab 02/03/16 1942 02/07/16 0618 02/08/16 0346  WBC 6.8 5.5 5.9  NEUTROABS 4.8  --   --   HGB 11.2* 13.2 12.6*  HCT 35.5* 40.9 39.5  MCV 91.0 91.1 91.4  PLT 230 288 311   Cardiac Enzymes: No results for input(s): CKTOTAL, CKMB, CKMBINDEX, TROPONINI in the last 168 hours. BNP: Invalid input(s): POCBNP CBG: No results for input(s): GLUCAP in the last 168 hours. D-Dimer No results for input(s): DDIMER in the last 72 hours. Hgb A1c No results for input(s): HGBA1C in the last 72 hours. Lipid Profile No results for input(s): CHOL, HDL, LDLCALC, TRIG, CHOLHDL, LDLDIRECT in the last 72 hours. Thyroid function studies No results for  input(s): TSH, T4TOTAL, T3FREE, THYROIDAB in the last 72 hours.  Invalid input(s): FREET3 Anemia work up No results for input(s): VITAMINB12, FOLATE, FERRITIN, TIBC, IRON, RETICCTPCT in the last 72 hours. Urinalysis    Component Value Date/Time   COLORURINE YELLOW 09/13/2015 1949   APPEARANCEUR CLEAR 09/13/2015 1949   LABSPEC 1.019 09/13/2015 1949   PHURINE 7.0 09/13/2015 1949   GLUCOSEU NEGATIVE 09/13/2015 1949   HGBUR NEGATIVE 09/13/2015 1949   HGBUR negative 02/24/2009 1100   BILIRUBINUR NEGATIVE 09/13/2015 1949   KETONESUR NEGATIVE 09/13/2015 1949   PROTEINUR NEGATIVE 09/13/2015 1949   UROBILINOGEN 1.0 03/26/2015 2036   NITRITE NEGATIVE 09/13/2015 1949   LEUKOCYTESUR NEGATIVE 09/13/2015 1949   Sepsis Labs Invalid input(s): PROCALCITONIN,  WBC,  LACTICIDVEN Microbiology No results found for this or any previous visit (from the past 240 hour(s)).   Time coordinating discharge: Over 30 minutes  SIGNED:   Clint LippsELMAHI,Nishtha Raider A, MD  [facility] 02/08/2016, 11:18  AM Pager   If 7PM-7AM, please contact night-coverage [email or web address] Password [email or web address]

## 2016-02-08 NOTE — Progress Notes (Signed)
Md paged to make aware that patient states he is out of all his medications and needs prescriptions.
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
## 2016-02-08 NOTE — ED Notes (Signed)
Pt given ham sandwich and water °

## 2016-02-08 NOTE — ED Notes (Signed)
Pt refusing to try to walk

## 2016-02-08 NOTE — Progress Notes (Signed)
SW spoke with patient at bedside. Patient was alert and oriented. There was no family present. SW offered to reach out to family members again today. However, the pt refused. Also, he states that he is not interested in SW giving him information such as food pantries and shelters. He states that he would have no where to put the food. Patient states he is aware of where the pantry and shelter is located.   SW reached out to [facility] and spoke with staff. They state that no beds are available at this time and to have the pt call back later. SW made pt aware.  [facility] and [facility] for pick up at 1:30. SW made nurse aware.   This plan has been staffed and LOS, and the patient's medical team is aware.  Crista CurbBrittney Candelario Steppe, MSW [phone]

## 2016-02-08 NOTE — ED Notes (Signed)
Pt refused d/c vitals and signature. Pt wheeled out to bus stop with security. Bus pass provided.

## 2016-02-12 ENCOUNTER — Encounter (HOSPITAL_COMMUNITY): Payer: Self-pay | Admitting: Emergency Medicine

## 2016-02-12 ENCOUNTER — Emergency Department (HOSPITAL_COMMUNITY)
Admission: EM | Admit: 2016-02-12 | Discharge: 2016-02-12 | Disposition: A | Discharge status: Home / Self Care | Payer: Medicaid Other | Attending: Emergency Medicine | Admitting: Emergency Medicine

## 2016-02-12 DIAGNOSIS — I10 Essential (primary) hypertension: Secondary | ICD-10-CM | POA: Diagnosis not present

## 2016-02-12 DIAGNOSIS — Z8673 Personal history of transient ischemic attack (TIA), and cerebral infarction without residual deficits: Secondary | ICD-10-CM | POA: Diagnosis not present

## 2016-02-12 DIAGNOSIS — R32 Unspecified urinary incontinence: Secondary | ICD-10-CM | POA: Diagnosis present

## 2016-02-12 DIAGNOSIS — R5381 Other malaise: Secondary | ICD-10-CM | POA: Diagnosis not present

## 2016-02-12 DIAGNOSIS — Z7982 Long term (current) use of aspirin: Secondary | ICD-10-CM | POA: Diagnosis not present

## 2016-02-12 DIAGNOSIS — F17210 Nicotine dependence, cigarettes, uncomplicated: Secondary | ICD-10-CM | POA: Diagnosis not present

## 2016-02-12 NOTE — ED Triage Notes (Signed)
Per EMS pt here to use facilities/clean up related to chronic bowel and bladder incontinence. With triage pt request to be seen for acute chronic right leg pain and social worker for resources for assistance/place to live.

## 2016-02-12 NOTE — Progress Notes (Signed)
[name] spoke with patient about his current living situation. Patient has been staying at [facility] and "on the street". Recently, [facility] and had to leave due to not needing a skilled level of care. Patient states his insurance [facility](Medicaid) would not cover his stay and he received a bill. [name] spoke with EDP and does not think patient would need placement a this time. [name] offered shelter list and [facility] however patient declined stating "[facility] [name] will provide a taxi voucher if needed.  Joshua GardnerErin Orene Mccormick, LCSWA Clinical Social Worker [phone]
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
## 2016-02-12 NOTE — Discharge Instructions (Signed)
[facility]

## 2016-02-13 ENCOUNTER — Encounter (HOSPITAL_COMMUNITY): Payer: Self-pay | Admitting: Emergency Medicine

## 2016-02-13 ENCOUNTER — Encounter: Payer: Self-pay | Admitting: Pediatric Intensive Care

## 2016-02-13 ENCOUNTER — Emergency Department (HOSPITAL_COMMUNITY)
Admission: EM | Admit: 2016-02-13 | Discharge: 2016-02-13 | Disposition: A | Discharge status: Home / Self Care | Payer: Medicaid Other | Attending: Emergency Medicine | Admitting: Emergency Medicine

## 2016-02-13 DIAGNOSIS — Z8673 Personal history of transient ischemic attack (TIA), and cerebral infarction without residual deficits: Secondary | ICD-10-CM | POA: Insufficient documentation

## 2016-02-13 DIAGNOSIS — F17210 Nicotine dependence, cigarettes, uncomplicated: Secondary | ICD-10-CM | POA: Insufficient documentation

## 2016-02-13 DIAGNOSIS — I10 Essential (primary) hypertension: Secondary | ICD-10-CM | POA: Diagnosis not present

## 2016-02-13 DIAGNOSIS — R531 Weakness: Secondary | ICD-10-CM | POA: Diagnosis present

## 2016-02-13 DIAGNOSIS — I890 Lymphedema, not elsewhere classified: Secondary | ICD-10-CM

## 2016-02-13 DIAGNOSIS — Z7982 Long term (current) use of aspirin: Secondary | ICD-10-CM | POA: Diagnosis not present

## 2016-02-13 DIAGNOSIS — R159 Full incontinence of feces: Secondary | ICD-10-CM

## 2016-02-13 MED ORDER — FUROSEMIDE 20 MG PO TABS: 40.0000 mg | ORAL_TABLET | Freq: Once | ORAL | Status: DC
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
## 2016-02-13 NOTE — Discharge Instructions (Signed)
Please take your Lasix daily.  Return to ER for new or worsening symptoms, any additional concerns.

## 2016-02-13 NOTE — ED Triage Notes (Addendum)
[facility]. ALert and oriented. Edema bilaterally. Pain in legs. Denies alcohol use today.

## 2016-02-13 NOTE — Discharge Planning (Signed)
EDCM consulted to assist with pt getting meds monthly and TED hose.  EDCM contacted [name] at [facility] who was pt personal Publishing copyassistance/soical worker. [name] states he picks up pt meds and gets him to various appointments (for years) and even though [facility], he will do what he can to help this pt.    EDCM left A-11 form on desk to be completed by pt RN to obtain TED hose from [facility] prior to pt dc home.  Joshua Ancheta J. Lucretia RoersWood, RN, BSN, [place] [phone]

## 2016-02-14 ENCOUNTER — Emergency Department (HOSPITAL_COMMUNITY): Payer: Medicaid Other

## 2016-02-14 ENCOUNTER — Encounter (HOSPITAL_COMMUNITY): Payer: Self-pay | Admitting: *Deleted

## 2016-02-14 ENCOUNTER — Emergency Department (HOSPITAL_COMMUNITY)
Admission: EM | Admit: 2016-02-14 | Discharge: 2016-02-15 | Disposition: A | Discharge status: Home / Self Care | Payer: Medicaid Other | Attending: Emergency Medicine | Admitting: Emergency Medicine

## 2016-02-14 DIAGNOSIS — Y929 Unspecified place or not applicable: Secondary | ICD-10-CM | POA: Insufficient documentation

## 2016-02-14 DIAGNOSIS — Z8673 Personal history of transient ischemic attack (TIA), and cerebral infarction without residual deficits: Secondary | ICD-10-CM | POA: Diagnosis not present

## 2016-02-14 DIAGNOSIS — I10 Essential (primary) hypertension: Secondary | ICD-10-CM | POA: Insufficient documentation

## 2016-02-14 DIAGNOSIS — Y939 Activity, unspecified: Secondary | ICD-10-CM | POA: Diagnosis not present

## 2016-02-14 DIAGNOSIS — S60221A Contusion of right hand, initial encounter: Secondary | ICD-10-CM | POA: Diagnosis not present

## 2016-02-14 DIAGNOSIS — Z7982 Long term (current) use of aspirin: Secondary | ICD-10-CM | POA: Diagnosis not present

## 2016-02-14 DIAGNOSIS — F17210 Nicotine dependence, cigarettes, uncomplicated: Secondary | ICD-10-CM | POA: Insufficient documentation

## 2016-02-14 DIAGNOSIS — W0110XA Fall on same level from slipping, tripping and stumbling with subsequent striking against unspecified object, initial encounter: Secondary | ICD-10-CM | POA: Diagnosis not present

## 2016-02-14 DIAGNOSIS — S0012XA Contusion of left eyelid and periocular area, initial encounter: Secondary | ICD-10-CM

## 2016-02-14 DIAGNOSIS — Y999 Unspecified external cause status: Secondary | ICD-10-CM | POA: Diagnosis not present

## 2016-02-14 DIAGNOSIS — S6991XA Unspecified injury of right wrist, hand and finger(s), initial encounter: Secondary | ICD-10-CM | POA: Diagnosis present

## 2016-02-14 DIAGNOSIS — W19XXXA Unspecified fall, initial encounter: Secondary | ICD-10-CM

## 2016-02-14 NOTE — ED Notes (Signed)
MD at bedside. 
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
## 2016-02-14 NOTE — ED Notes (Signed)
Crackers and Sandwich given to patient.

## 2016-02-14 NOTE — Care Management (Signed)
Patient was seen here yesterday and the past several days, he is homeless. Patient has had 35 ED visits in the past 6 months. Patient was placed several months ago from the ED in an ALF. Spoke ED [name], and was informed patient was discharged due to refusing to transfer to [place]. Patient is currently homeless, and [facility]. No further ED CM or ED [name] needs identified.

## 2016-02-14 NOTE — ED Notes (Signed)
Patient transported to X-ray 
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
## 2016-02-15 NOTE — ED Notes (Signed)
Pt departed in NAD.  

## 2016-02-20 ENCOUNTER — Emergency Department (HOSPITAL_COMMUNITY)
Admission: EM | Admit: 2016-02-20 | Discharge: 2016-02-20 | Disposition: A | Discharge status: Home / Self Care | Payer: Medicaid Other | Attending: Emergency Medicine | Admitting: Emergency Medicine

## 2016-02-20 ENCOUNTER — Encounter (HOSPITAL_COMMUNITY): Payer: Self-pay

## 2016-02-20 DIAGNOSIS — Z79899 Other long term (current) drug therapy: Secondary | ICD-10-CM | POA: Insufficient documentation

## 2016-02-20 DIAGNOSIS — Z5900 Homelessness unspecified: Secondary | ICD-10-CM

## 2016-02-20 DIAGNOSIS — M7989 Other specified soft tissue disorders: Secondary | ICD-10-CM | POA: Diagnosis present

## 2016-02-20 DIAGNOSIS — I890 Lymphedema, not elsewhere classified: Secondary | ICD-10-CM | POA: Insufficient documentation

## 2016-02-20 DIAGNOSIS — I10 Essential (primary) hypertension: Secondary | ICD-10-CM | POA: Diagnosis not present

## 2016-02-20 DIAGNOSIS — Z7982 Long term (current) use of aspirin: Secondary | ICD-10-CM | POA: Diagnosis not present

## 2016-02-20 DIAGNOSIS — F17210 Nicotine dependence, cigarettes, uncomplicated: Secondary | ICD-10-CM | POA: Diagnosis not present

## 2016-02-20 MED ORDER — POTASSIUM CHLORIDE CRYS ER 20 MEQ PO TBCR
20.0000 meq | EXTENDED_RELEASE_TABLET | Freq: Once | ORAL | Status: AC
  Administered 2016-02-20: 20 meq via ORAL
  Filled 2016-02-20: qty 1

## 2016-02-20 MED ORDER — FUROSEMIDE 40 MG PO TABS
40.0000 mg | ORAL_TABLET | Freq: Once | ORAL | Status: AC
  Administered 2016-02-20: 40 mg via ORAL
  Filled 2016-02-20: qty 1

## 2016-02-20 NOTE — ED Notes (Signed)
PA at bedside.

## 2016-02-20 NOTE — Discharge Instructions (Signed)
Need to have your medications pickup her pharmacy and continue to take them. Please follow-up with primary care doctor. His resource guide and have been attached. Return to ED if your symptoms worsen.
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
## 2016-02-20 NOTE — Progress Notes (Addendum)
medicaid ran out and he got discharged from his SNF per pt.  Per SW pt refused to move to He has been in the homeless shelter [facility] and 1 admission in the last 6 months [facility] but does [facility] some [facility] from [facility] CP in [place]  There are no CM interventions available to assist pt at this time Discussed with ED PA/NP Recommended a possible SW consult

## 2016-02-20 NOTE — ED Triage Notes (Signed)
Pt presents via EMS for bilateral leg edema that is a chronic issue for this pt.

## 2016-02-20 NOTE — ED Notes (Signed)
Patient sitting in hallway C area in wheelchair.  When asked why patient came to ED, he answered "I can't move."  When asked if patient is having pain, patient's head fell to his chest and he was asleep.  Patient also said,"I can't stand up."  Patient cannot stay awake.  Responds to voice but then falls back asleep.

## 2016-02-23 ENCOUNTER — Encounter (HOSPITAL_COMMUNITY): Payer: Self-pay | Admitting: Emergency Medicine

## 2016-02-23 ENCOUNTER — Emergency Department (HOSPITAL_COMMUNITY)
Admission: EM | Admit: 2016-02-23 | Discharge: 2016-02-23 | Disposition: A | Discharge status: Home / Self Care | Payer: Medicaid Other | Attending: Emergency Medicine | Admitting: Emergency Medicine

## 2016-02-23 DIAGNOSIS — Y999 Unspecified external cause status: Secondary | ICD-10-CM | POA: Diagnosis not present

## 2016-02-23 DIAGNOSIS — Z9114 Patient's other noncompliance with medication regimen: Secondary | ICD-10-CM | POA: Insufficient documentation

## 2016-02-23 DIAGNOSIS — I10 Essential (primary) hypertension: Secondary | ICD-10-CM | POA: Diagnosis not present

## 2016-02-23 DIAGNOSIS — W010XXA Fall on same level from slipping, tripping and stumbling without subsequent striking against object, initial encounter: Secondary | ICD-10-CM | POA: Diagnosis not present

## 2016-02-23 DIAGNOSIS — R609 Edema, unspecified: Secondary | ICD-10-CM | POA: Diagnosis not present

## 2016-02-23 DIAGNOSIS — Y92129 Unspecified place in nursing home as the place of occurrence of the external cause: Secondary | ICD-10-CM | POA: Insufficient documentation

## 2016-02-23 DIAGNOSIS — F17210 Nicotine dependence, cigarettes, uncomplicated: Secondary | ICD-10-CM | POA: Diagnosis not present

## 2016-02-23 DIAGNOSIS — Z7982 Long term (current) use of aspirin: Secondary | ICD-10-CM | POA: Insufficient documentation

## 2016-02-23 DIAGNOSIS — Y9301 Activity, walking, marching and hiking: Secondary | ICD-10-CM | POA: Diagnosis not present

## 2016-02-23 DIAGNOSIS — W19XXXA Unspecified fall, initial encounter: Secondary | ICD-10-CM

## 2016-02-23 DIAGNOSIS — M79602 Pain in left arm: Secondary | ICD-10-CM | POA: Insufficient documentation

## 2016-02-23 NOTE — ED Notes (Signed)
Pt was ambulatory in waiting room lobby with assist(walker)

## 2016-02-23 NOTE — Progress Notes (Signed)
[name] contacted department director regarding frequent visits to ED and nursing home placement. Patient has 33 visits to the ED in the past 6 months with 1 admission. [name] will provide patient with resources [facility].   Stacy GardnerErin Ameisha Mcclellan, LCSWA Clinical Social Worker [phone]

## 2016-02-23 NOTE — Progress Notes (Addendum)
Pt was assited with a shower due to dried stool on his buttocks. Pt was also given a hot meal and his clothes were washed and dried. Pt met with the social worker requested housing. His was issued a cab voucher and told he has a bed at the [facility]. Pt stated he is appealing for disabilty now. 9pm _pt is waiting for his clothes to dry.

## 2016-02-23 NOTE — ED Notes (Signed)
Pt ambulatory from waiting into room 28.

## 2016-02-23 NOTE — Discharge Instructions (Signed)
Follow up with the DSS as the social worker discussed with you.
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
## 2016-02-23 NOTE — ED Triage Notes (Signed)
Per EMS-states he fell today-wants nursing home placement

## 2016-02-23 NOTE — Progress Notes (Signed)
[facility]  Suggested APS consult, getting in contact with DSS worker.

## 2016-02-23 NOTE — ED Notes (Signed)
Patient presents from triage smelling of urine and stool.  He c/o bilateral arm pain and has bilateral 3+ LE pitting edema and states he is not taking his Lasix as prescribed because he is homeless.  Patient is requesting a SW consult, stating he wants nursing home placement.

## 2016-03-01 ENCOUNTER — Encounter (HOSPITAL_COMMUNITY): Payer: Self-pay | Admitting: *Deleted

## 2016-03-01 ENCOUNTER — Emergency Department (HOSPITAL_COMMUNITY)
Admission: EM | Admit: 2016-03-01 | Discharge: 2016-03-01 | Disposition: A | Discharge status: Home / Self Care | Payer: Medicaid Other | Attending: Emergency Medicine | Admitting: Emergency Medicine

## 2016-03-01 DIAGNOSIS — Y939 Activity, unspecified: Secondary | ICD-10-CM | POA: Insufficient documentation

## 2016-03-01 DIAGNOSIS — Y999 Unspecified external cause status: Secondary | ICD-10-CM | POA: Diagnosis not present

## 2016-03-01 DIAGNOSIS — W19XXXA Unspecified fall, initial encounter: Secondary | ICD-10-CM

## 2016-03-01 DIAGNOSIS — Z7982 Long term (current) use of aspirin: Secondary | ICD-10-CM | POA: Insufficient documentation

## 2016-03-01 DIAGNOSIS — I10 Essential (primary) hypertension: Secondary | ICD-10-CM | POA: Diagnosis not present

## 2016-03-01 DIAGNOSIS — Y929 Unspecified place or not applicable: Secondary | ICD-10-CM | POA: Insufficient documentation

## 2016-03-01 DIAGNOSIS — W1830XA Fall on same level, unspecified, initial encounter: Secondary | ICD-10-CM | POA: Insufficient documentation

## 2016-03-01 DIAGNOSIS — F17210 Nicotine dependence, cigarettes, uncomplicated: Secondary | ICD-10-CM | POA: Diagnosis not present

## 2016-03-01 DIAGNOSIS — Z8673 Personal history of transient ischemic attack (TIA), and cerebral infarction without residual deficits: Secondary | ICD-10-CM | POA: Diagnosis not present

## 2016-03-01 DIAGNOSIS — I890 Lymphedema, not elsewhere classified: Secondary | ICD-10-CM | POA: Insufficient documentation

## 2016-03-01 NOTE — Progress Notes (Signed)
ED CM consulted by ED CM about pt walker, requesting new one or old one to be fixed ED CM spoke with Marylu LundJanet ED RN - Pt has received present DME within last 2-3 yrs when he had initially obtained medicaid coverage (medicaid generally does not assist with further DME if pt had recently obtained DME)  CM left message for [facility]ME staff to check to see if pt is eligible for further DME

## 2016-03-01 NOTE — Progress Notes (Signed)
ED CM spoke with [name] of [facility] and pt is not able to get a new rollator but can get a wheel chair from the [facility] on [address] pt given the address and [facility] at [address] [place] [phone] EDP, Belfi, signed orders and provided face to face Pt informed Cm he did not think he could push a standard w/c  Pt made aware that there is no one available to fix his rollator

## 2016-03-01 NOTE — Progress Notes (Signed)
[name] spoke with patient at bedside and provided him with resources for ALF. [name] informed patient to connect with his PCP regarding an ALF. He stated he spoke with someone regarding an ALF and he does not have a PCP.  Staffed case with ED CM who confirms patient has a doctor.   Elenore PaddyLaVonia Enna Warwick, LCSWA [phone] ED [name] 03/01/2016 3:18 PM

## 2016-03-01 NOTE — Progress Notes (Signed)
Patient suffers from chronic lymphedema, cellulitis of lower extremites which impairs their ability to perform daily activities like bathing, dressing and mobility in the home.  A cane nor crutch will not resolve  issue with performing activities of daily living. A wheelchair will allow patient to safely perform daily activities. Patient can safely propel the wheelchair in the home or has a caregiver who can provide assistance.  Accessories: elevating leg rests (ELRs), wheel locks, extensions and anti-tippers.

## 2016-03-01 NOTE — Progress Notes (Addendum)
CM called and spoke with Vaughan BastaJermaine of [facility] after registration confirmed pt with medicaid coverage Unable to confirm if pt received both a w/c and a rollator in last 2-3 yrs but Vaughan BastaJermaine will try to find what maybe possible available  Pt unable to get charity services for DME related to pt having medicaid  ED SW went to offer pt resources  Pt with 34 ED visits in the last 6 months

## 2016-03-01 NOTE — ED Triage Notes (Signed)
Per EMS, pt states he was in the sun for 20 minutes and felt weak. Pt had witnessed fall and was assisted to his walker. Pt states he usually feels weak after spending time in the sun, pt states "the sunlight discombobulates me and by the time I realized it I can't move.. Pt's CBG 123.

## 2016-03-01 NOTE — Progress Notes (Signed)
Pt updated about orders for DME in EPIC and contact with [facility] with pending possible assist with other DME Pt confirms with ED CM that he is aware that "medicaid only pays for you to have two a year and this is my second one this year. I just need it fix. It gets away from me." CM discussed not being aware of a vendor to fix it but will updated him when response from [facility] Pt with foul odor from lower extremities  CM discussed with pt also that his pcp is listed as [facility] Pt did not deny this but nodded his head Cm discussed that he could receive assist for facility placement with pcp assistance Pt voiced he understood Cm reminded pt of pcp address and had him recall that [facility] Pt nodded his head in understanding

## 2016-03-01 NOTE — ED Notes (Signed)
MD at bedside. 
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
## 2016-03-01 NOTE — Progress Notes (Signed)
Spoke with EDP, Belfi,  about need of face to face with note for DME (wheelchair) for medicaid coverage

## 2016-03-02 ENCOUNTER — Encounter (HOSPITAL_COMMUNITY): Payer: Self-pay | Admitting: Emergency Medicine

## 2016-03-02 ENCOUNTER — Emergency Department (HOSPITAL_COMMUNITY)
Admission: EM | Admit: 2016-03-02 | Discharge: 2016-03-02 | Disposition: A | Discharge status: Home / Self Care | Payer: Medicaid Other | Attending: Emergency Medicine | Admitting: Emergency Medicine

## 2016-03-02 DIAGNOSIS — F17210 Nicotine dependence, cigarettes, uncomplicated: Secondary | ICD-10-CM | POA: Diagnosis not present

## 2016-03-02 DIAGNOSIS — Z5900 Homelessness unspecified: Secondary | ICD-10-CM

## 2016-03-02 DIAGNOSIS — Y999 Unspecified external cause status: Secondary | ICD-10-CM | POA: Diagnosis not present

## 2016-03-02 DIAGNOSIS — Z7982 Long term (current) use of aspirin: Secondary | ICD-10-CM | POA: Insufficient documentation

## 2016-03-02 DIAGNOSIS — Y929 Unspecified place or not applicable: Secondary | ICD-10-CM | POA: Insufficient documentation

## 2016-03-02 DIAGNOSIS — I10 Essential (primary) hypertension: Secondary | ICD-10-CM | POA: Diagnosis not present

## 2016-03-02 DIAGNOSIS — Y939 Activity, unspecified: Secondary | ICD-10-CM | POA: Insufficient documentation

## 2016-03-02 DIAGNOSIS — Z8673 Personal history of transient ischemic attack (TIA), and cerebral infarction without residual deficits: Secondary | ICD-10-CM | POA: Diagnosis not present

## 2016-03-02 DIAGNOSIS — R531 Weakness: Secondary | ICD-10-CM | POA: Diagnosis present

## 2016-03-02 DIAGNOSIS — W19XXXA Unspecified fall, initial encounter: Secondary | ICD-10-CM | POA: Insufficient documentation

## 2016-03-02 LAB — I-STAT CHEM 8, ED
BUN: 11 mg/dL (ref 6–20)
CALCIUM ION: 1.11 mmol/L — AB (ref 1.15–1.40)
CHLORIDE: 101 mmol/L (ref 101–111)
Creatinine, Ser: 0.8 mg/dL (ref 0.61–1.24)
Glucose, Bld: 83 mg/dL (ref 65–99)
HEMATOCRIT: 36 % — AB (ref 39.0–52.0)
Hemoglobin: 12.2 g/dL — ABNORMAL LOW (ref 13.0–17.0)
Potassium: 3.8 mmol/L (ref 3.5–5.1)
SODIUM: 141 mmol/L (ref 135–145)
TCO2: 28 mmol/L (ref 0–100)
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
## 2016-03-02 NOTE — ED Notes (Signed)
Bed: [id number] Expected date: 03/02/16 Expected time:  Means of arrival:  Comments: Heat exposure
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
## 2016-03-02 NOTE — ED Triage Notes (Signed)
Pt called out for heat exposure.On EMS arrival pt hot to touch, tympanic temp 100.0. PTAR stated pt appeared lethargic, CBG 91, IV started with NS bolus given. Initial HR 110, now 100 in triage. Pt denies pain, no obvious injury, also stated he fell and doesn't know what he hit.

## 2016-03-08 ENCOUNTER — Encounter (HOSPITAL_COMMUNITY): Payer: Self-pay | Admitting: General Practice

## 2016-03-08 ENCOUNTER — Emergency Department (HOSPITAL_COMMUNITY): Payer: Medicaid Other

## 2016-03-08 ENCOUNTER — Inpatient Hospital Stay (HOSPITAL_COMMUNITY)
Admission: EM | Admit: 2016-03-08 | Discharge: 2016-03-18 | DRG: 593 | Disposition: A | Discharge status: Skilled Nursing Facility | Payer: Medicaid Other | Attending: Family Medicine | Admitting: Family Medicine

## 2016-03-08 DIAGNOSIS — Z5900 Homelessness unspecified: Secondary | ICD-10-CM

## 2016-03-08 DIAGNOSIS — L97229 Non-pressure chronic ulcer of left calf with unspecified severity: Principal | ICD-10-CM | POA: Diagnosis present

## 2016-03-08 DIAGNOSIS — M6281 Muscle weakness (generalized): Secondary | ICD-10-CM

## 2016-03-08 DIAGNOSIS — R000 Tachycardia, unspecified: Secondary | ICD-10-CM | POA: Diagnosis not present

## 2016-03-08 DIAGNOSIS — L02416 Cutaneous abscess of left lower limb: Secondary | ICD-10-CM

## 2016-03-08 DIAGNOSIS — E785 Hyperlipidemia, unspecified: Secondary | ICD-10-CM | POA: Diagnosis present

## 2016-03-08 DIAGNOSIS — R531 Weakness: Secondary | ICD-10-CM

## 2016-03-08 DIAGNOSIS — Z596 Low income: Secondary | ICD-10-CM

## 2016-03-08 DIAGNOSIS — L03116 Cellulitis of left lower limb: Secondary | ICD-10-CM | POA: Diagnosis present

## 2016-03-08 DIAGNOSIS — G4733 Obstructive sleep apnea (adult) (pediatric): Secondary | ICD-10-CM | POA: Diagnosis present

## 2016-03-08 DIAGNOSIS — R05 Cough: Secondary | ICD-10-CM | POA: Diagnosis present

## 2016-03-08 DIAGNOSIS — R6510 Systemic inflammatory response syndrome (SIRS) of non-infectious origin without acute organ dysfunction: Secondary | ICD-10-CM | POA: Diagnosis present

## 2016-03-08 DIAGNOSIS — B999 Unspecified infectious disease: Secondary | ICD-10-CM

## 2016-03-08 DIAGNOSIS — L0291 Cutaneous abscess, unspecified: Secondary | ICD-10-CM

## 2016-03-08 DIAGNOSIS — Z6838 Body mass index (BMI) 38.0-38.9, adult: Secondary | ICD-10-CM

## 2016-03-08 DIAGNOSIS — I890 Lymphedema, not elsewhere classified: Secondary | ICD-10-CM | POA: Diagnosis not present

## 2016-03-08 DIAGNOSIS — L539 Erythematous condition, unspecified: Secondary | ICD-10-CM | POA: Insufficient documentation

## 2016-03-08 DIAGNOSIS — M79605 Pain in left leg: Secondary | ICD-10-CM

## 2016-03-08 DIAGNOSIS — Z9114 Patient's other noncompliance with medication regimen: Secondary | ICD-10-CM

## 2016-03-08 DIAGNOSIS — R7989 Other specified abnormal findings of blood chemistry: Secondary | ICD-10-CM | POA: Diagnosis present

## 2016-03-08 DIAGNOSIS — Z8673 Personal history of transient ischemic attack (TIA), and cerebral infarction without residual deficits: Secondary | ICD-10-CM

## 2016-03-08 DIAGNOSIS — R509 Fever, unspecified: Secondary | ICD-10-CM

## 2016-03-08 DIAGNOSIS — K219 Gastro-esophageal reflux disease without esophagitis: Secondary | ICD-10-CM | POA: Diagnosis present

## 2016-03-08 DIAGNOSIS — Z79899 Other long term (current) drug therapy: Secondary | ICD-10-CM

## 2016-03-08 DIAGNOSIS — I10 Essential (primary) hypertension: Secondary | ICD-10-CM

## 2016-03-08 DIAGNOSIS — F17210 Nicotine dependence, cigarettes, uncomplicated: Secondary | ICD-10-CM | POA: Diagnosis present

## 2016-03-08 DIAGNOSIS — A419 Sepsis, unspecified organism: Secondary | ICD-10-CM

## 2016-03-08 DIAGNOSIS — R059 Cough, unspecified: Secondary | ICD-10-CM

## 2016-03-08 DIAGNOSIS — Z7982 Long term (current) use of aspirin: Secondary | ICD-10-CM

## 2016-03-08 HISTORY — DX: Morbid (severe) obesity due to excess calories: E66.01

## 2016-03-08 LAB — CBC WITH DIFFERENTIAL/PLATELET
BASOS PCT: 0 %
Basophils Absolute: 0 10*3/uL (ref 0.0–0.1)
EOS ABS: 0 10*3/uL (ref 0.0–0.7)
Eosinophils Relative: 0 %
HCT: 39.5 % (ref 39.0–52.0)
Hemoglobin: 12.8 g/dL — ABNORMAL LOW (ref 13.0–17.0)
Lymphocytes Relative: 5 %
Lymphs Abs: 0.7 10*3/uL (ref 0.7–4.0)
MCH: 29.1 pg (ref 26.0–34.0)
MCHC: 32.4 g/dL (ref 30.0–36.0)
MCV: 89.8 fL (ref 78.0–100.0)
MONO ABS: 0.8 10*3/uL (ref 0.1–1.0)
MONOS PCT: 6 %
Neutro Abs: 12 10*3/uL — ABNORMAL HIGH (ref 1.7–7.7)
Neutrophils Relative %: 89 %
PLATELETS: 313 10*3/uL (ref 150–400)
RBC: 4.4 MIL/uL (ref 4.22–5.81)
RDW: 14.6 % (ref 11.5–15.5)
WBC: 13.5 10*3/uL — ABNORMAL HIGH (ref 4.0–10.5)

## 2016-03-08 LAB — BASIC METABOLIC PANEL
Anion gap: 12 (ref 5–15)
BUN: 8 mg/dL (ref 6–20)
CALCIUM: 9.1 mg/dL (ref 8.9–10.3)
CO2: 25 mmol/L (ref 22–32)
CREATININE: 0.75 mg/dL (ref 0.61–1.24)
Chloride: 101 mmol/L (ref 101–111)
Glucose, Bld: 67 mg/dL (ref 65–99)
Potassium: 4.6 mmol/L (ref 3.5–5.1)
SODIUM: 138 mmol/L (ref 135–145)

## 2016-03-08 LAB — I-STAT CHEM 8, ED
BUN: 9 mg/dL (ref 6–20)
CALCIUM ION: 1.08 mmol/L — AB (ref 1.15–1.40)
CREATININE: 0.8 mg/dL (ref 0.61–1.24)
Chloride: 101 mmol/L (ref 101–111)
Glucose, Bld: 114 mg/dL — ABNORMAL HIGH (ref 65–99)
HCT: 41 % (ref 39.0–52.0)
HEMOGLOBIN: 13.9 g/dL (ref 13.0–17.0)
Potassium: 4.2 mmol/L (ref 3.5–5.1)
SODIUM: 139 mmol/L (ref 135–145)
TCO2: 28 mmol/L (ref 0–100)

## 2016-03-08 LAB — TSH: TSH: 0.547 u[IU]/mL (ref 0.350–4.500)

## 2016-03-08 LAB — BRAIN NATRIURETIC PEPTIDE: B NATRIURETIC PEPTIDE 5: 27.6 pg/mL (ref 0.0–100.0)

## 2016-03-08 MED ORDER — CEPHALEXIN 500 MG PO CAPS
500.0000 mg | ORAL_CAPSULE | Freq: Four times a day (QID) | ORAL | Status: DC
  Administered 2016-03-08 – 2016-03-09 (×2): 500 mg via ORAL
  Filled 2016-03-08 (×2): qty 1

## 2016-03-08 MED ORDER — FUROSEMIDE 10 MG/ML IJ SOLN
40.0000 mg | Freq: Once | INTRAMUSCULAR | Status: AC
  Administered 2016-03-08: 40 mg via INTRAVENOUS
  Filled 2016-03-08: qty 4

## 2016-03-08 MED ORDER — FUROSEMIDE 20 MG PO TABS
40.0000 mg | ORAL_TABLET | Freq: Once | ORAL | Status: AC
  Administered 2016-03-08: 40 mg via ORAL
  Filled 2016-03-08: qty 2

## 2016-03-08 MED ORDER — SODIUM CHLORIDE 0.9% FLUSH
3.0000 mL | Freq: Two times a day (BID) | INTRAVENOUS | Status: DC
  Administered 2016-03-08 – 2016-03-10 (×4): 3 mL via INTRAVENOUS

## 2016-03-08 MED ORDER — POLYETHYLENE GLYCOL 3350 17 G PO PACK
17.0000 g | PACK | Freq: Every day | ORAL | Status: DC | PRN
  Administered 2016-03-12: 17 g via ORAL
  Filled 2016-03-08: qty 1

## 2016-03-08 MED ORDER — CARVEDILOL 12.5 MG PO TABS
6.2500 mg | ORAL_TABLET | Freq: Two times a day (BID) | ORAL | Status: DC
  Administered 2016-03-08 – 2016-03-18 (×19): 6.25 mg via ORAL
  Filled 2016-03-08 (×19): qty 1

## 2016-03-08 MED ORDER — ENOXAPARIN SODIUM 40 MG/0.4ML ~~LOC~~ SOLN
40.0000 mg | SUBCUTANEOUS | Status: DC
  Administered 2016-03-09: 40 mg via SUBCUTANEOUS
  Filled 2016-03-08: qty 0.4

## 2016-03-08 MED ORDER — SODIUM CHLORIDE 0.9% FLUSH
3.0000 mL | Freq: Two times a day (BID) | INTRAVENOUS | Status: DC
  Administered 2016-03-09 – 2016-03-10 (×2): 3 mL via INTRAVENOUS

## 2016-03-08 MED ORDER — SODIUM CHLORIDE 0.9 % IV BOLUS (SEPSIS)
500.0000 mL | Freq: Once | INTRAVENOUS | Status: AC
  Administered 2016-03-08: 500 mL via INTRAVENOUS

## 2016-03-08 MED ORDER — POTASSIUM CHLORIDE CRYS ER 20 MEQ PO TBCR
40.0000 meq | EXTENDED_RELEASE_TABLET | Freq: Once | ORAL | Status: AC
  Administered 2016-03-08: 40 meq via ORAL
  Filled 2016-03-08: qty 2

## 2016-03-08 MED ORDER — NICOTINE 21 MG/24HR TD PT24
21.0000 mg | MEDICATED_PATCH | Freq: Every day | TRANSDERMAL | Status: DC
  Filled 2016-03-08 (×6): qty 1

## 2016-03-08 MED ORDER — SODIUM CHLORIDE 0.9 % IV SOLN
250.0000 mL | INTRAVENOUS | Status: DC | PRN
  Administered 2016-03-09: 250 mL via INTRAVENOUS

## 2016-03-08 MED ORDER — FUROSEMIDE 40 MG PO TABS: 40.0000 mg | ORAL_TABLET | Freq: Two times a day (BID) | ORAL | 0 refills | Status: DC

## 2016-03-08 MED ORDER — LABETALOL HCL 5 MG/ML IV SOLN
20.0000 mg | Freq: Once | INTRAVENOUS | Status: AC
  Administered 2016-03-08: 20 mg via INTRAVENOUS
  Filled 2016-03-08: qty 4

## 2016-03-08 MED ORDER — FUROSEMIDE 40 MG PO TABS
40.0000 mg | ORAL_TABLET | Freq: Two times a day (BID) | ORAL | Status: DC
  Administered 2016-03-09 – 2016-03-10 (×3): 40 mg via ORAL
  Filled 2016-03-08 (×3): qty 1

## 2016-03-08 MED ORDER — ACETAMINOPHEN 325 MG PO TABS
650.0000 mg | ORAL_TABLET | Freq: Four times a day (QID) | ORAL | Status: DC | PRN
  Administered 2016-03-12 – 2016-03-18 (×4): 650 mg via ORAL
  Filled 2016-03-08 (×4): qty 2

## 2016-03-08 MED ORDER — ONDANSETRON HCL 4 MG/2ML IJ SOLN
4.0000 mg | Freq: Four times a day (QID) | INTRAMUSCULAR | Status: DC | PRN
Start: 2016-03-08 — End: 2016-03-18

## 2016-03-08 MED ORDER — ONDANSETRON HCL 4 MG PO TABS: 4.0000 mg | ORAL_TABLET | Freq: Four times a day (QID) | ORAL | Status: DC | PRN

## 2016-03-08 MED ORDER — FUROSEMIDE 20 MG PO TABS
80.0000 mg | ORAL_TABLET | Freq: Once | ORAL | Status: AC
  Administered 2016-03-08: 80 mg via ORAL
  Filled 2016-03-08: qty 4

## 2016-03-08 MED ORDER — SODIUM CHLORIDE 0.9% FLUSH: 3.0000 mL | INTRAVENOUS | Status: DC | PRN

## 2016-03-08 MED ORDER — ACETAMINOPHEN 650 MG RE SUPP: 650.0000 mg | Freq: Four times a day (QID) | RECTAL | Status: DC | PRN

## 2016-03-08 MED ORDER — ACETAMINOPHEN 500 MG PO TABS
1000.0000 mg | ORAL_TABLET | Freq: Once | ORAL | Status: AC
  Administered 2016-03-08: 1000 mg via ORAL
  Filled 2016-03-08: qty 2
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
## 2016-03-08 NOTE — ED Notes (Signed)
[facility]
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
## 2016-03-08 NOTE — ED Triage Notes (Signed)
[facility]. Patient at baseline can walk with walker; however, today he felt he was more weak. EMS BP: 190/100. Hx of HTN, COPD, edema in lower extremities. EMS states he passed their stroke eval.

## 2016-03-08 NOTE — ED Notes (Signed)
EDP aware patient HR 140
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
## 2016-03-08 NOTE — Progress Notes (Signed)
[name] provided Patient with clothing from clothes closet. Patient appreciative of [name]'s assistance. [name] signing off. Please contact if new need(s) arise.          Lance MussAshley Gardner,MSW, LCSW [facility]/95M Clinical Social Worker [phone]

## 2016-03-08 NOTE — ED Notes (Signed)
I&O cath performed by kyle emt and Boone Ducre rn. 800 mls return.

## 2016-03-08 NOTE — Discharge Planning (Signed)
This pt has Medicaid and is not eligible for any medication discounts.  [facility] and[facility]or [facility] and really, anywhere.  [facility] and has personal case worker who will take him where he needs to go and run any errands.  EDCM will remind pt of resources and encourage him to engage in his healthcare.    Jamair Cato J. Lucretia RoersWood, RN, BSN, [place] [phone]

## 2016-03-08 NOTE — ED Notes (Signed)
Attempted report x1. 

## 2016-03-09 ENCOUNTER — Encounter (HOSPITAL_COMMUNITY): Payer: Self-pay | Admitting: Family Medicine

## 2016-03-09 ENCOUNTER — Observation Stay (HOSPITAL_BASED_OUTPATIENT_CLINIC_OR_DEPARTMENT_OTHER): Payer: Medicaid Other

## 2016-03-09 ENCOUNTER — Observation Stay (HOSPITAL_COMMUNITY): Payer: Medicaid Other

## 2016-03-09 DIAGNOSIS — R000 Tachycardia, unspecified: Secondary | ICD-10-CM | POA: Diagnosis present

## 2016-03-09 DIAGNOSIS — I890 Lymphedema, not elsewhere classified: Secondary | ICD-10-CM | POA: Diagnosis present

## 2016-03-09 DIAGNOSIS — I10 Essential (primary) hypertension: Secondary | ICD-10-CM | POA: Diagnosis present

## 2016-03-09 DIAGNOSIS — Z8673 Personal history of transient ischemic attack (TIA), and cerebral infarction without residual deficits: Secondary | ICD-10-CM | POA: Diagnosis not present

## 2016-03-09 DIAGNOSIS — L97229 Non-pressure chronic ulcer of left calf with unspecified severity: Secondary | ICD-10-CM | POA: Diagnosis present

## 2016-03-09 DIAGNOSIS — F17210 Nicotine dependence, cigarettes, uncomplicated: Secondary | ICD-10-CM | POA: Diagnosis present

## 2016-03-09 DIAGNOSIS — M7989 Other specified soft tissue disorders: Secondary | ICD-10-CM | POA: Diagnosis not present

## 2016-03-09 DIAGNOSIS — Z79899 Other long term (current) drug therapy: Secondary | ICD-10-CM | POA: Diagnosis not present

## 2016-03-09 DIAGNOSIS — G4733 Obstructive sleep apnea (adult) (pediatric): Secondary | ICD-10-CM | POA: Diagnosis present

## 2016-03-09 DIAGNOSIS — M79609 Pain in unspecified limb: Secondary | ICD-10-CM

## 2016-03-09 DIAGNOSIS — L03116 Cellulitis of left lower limb: Secondary | ICD-10-CM | POA: Diagnosis present

## 2016-03-09 DIAGNOSIS — Z7982 Long term (current) use of aspirin: Secondary | ICD-10-CM | POA: Diagnosis not present

## 2016-03-09 DIAGNOSIS — Z596 Low income: Secondary | ICD-10-CM | POA: Diagnosis not present

## 2016-03-09 DIAGNOSIS — R6510 Systemic inflammatory response syndrome (SIRS) of non-infectious origin without acute organ dysfunction: Secondary | ICD-10-CM | POA: Diagnosis present

## 2016-03-09 DIAGNOSIS — E785 Hyperlipidemia, unspecified: Secondary | ICD-10-CM | POA: Diagnosis present

## 2016-03-09 DIAGNOSIS — K219 Gastro-esophageal reflux disease without esophagitis: Secondary | ICD-10-CM | POA: Diagnosis present

## 2016-03-09 DIAGNOSIS — L02416 Cutaneous abscess of left lower limb: Secondary | ICD-10-CM | POA: Diagnosis present

## 2016-03-09 DIAGNOSIS — Z6838 Body mass index (BMI) 38.0-38.9, adult: Secondary | ICD-10-CM | POA: Diagnosis not present

## 2016-03-09 DIAGNOSIS — M6281 Muscle weakness (generalized): Secondary | ICD-10-CM | POA: Diagnosis present

## 2016-03-09 DIAGNOSIS — Z590 Homelessness: Secondary | ICD-10-CM | POA: Diagnosis not present

## 2016-03-09 DIAGNOSIS — R7989 Other specified abnormal findings of blood chemistry: Secondary | ICD-10-CM | POA: Diagnosis present

## 2016-03-09 DIAGNOSIS — Z9114 Patient's other noncompliance with medication regimen: Secondary | ICD-10-CM | POA: Diagnosis not present

## 2016-03-09 DIAGNOSIS — R05 Cough: Secondary | ICD-10-CM | POA: Diagnosis present

## 2016-03-09 HISTORY — DX: Morbid (severe) obesity due to excess calories: E66.01

## 2016-03-09 LAB — CBC
HCT: 37.4 % — ABNORMAL LOW (ref 39.0–52.0)
HEMOGLOBIN: 12.3 g/dL — AB (ref 13.0–17.0)
MCH: 29.4 pg (ref 26.0–34.0)
MCHC: 32.9 g/dL (ref 30.0–36.0)
MCV: 89.5 fL (ref 78.0–100.0)
Platelets: 256 10*3/uL (ref 150–400)
RBC: 4.18 MIL/uL — AB (ref 4.22–5.81)
RDW: 15 % (ref 11.5–15.5)
WBC: 20.7 10*3/uL — ABNORMAL HIGH (ref 4.0–10.5)

## 2016-03-09 LAB — BASIC METABOLIC PANEL
ANION GAP: 9 (ref 5–15)
BUN: 8 mg/dL (ref 6–20)
CALCIUM: 8.5 mg/dL — AB (ref 8.9–10.3)
CO2: 26 mmol/L (ref 22–32)
Chloride: 102 mmol/L (ref 101–111)
Creatinine, Ser: 0.87 mg/dL (ref 0.61–1.24)
Glucose, Bld: 98 mg/dL (ref 65–99)
POTASSIUM: 3.8 mmol/L (ref 3.5–5.1)
Sodium: 137 mmol/L (ref 135–145)

## 2016-03-09 LAB — LACTIC ACID, PLASMA: LACTIC ACID, VENOUS: 1.2 mmol/L (ref 0.5–1.9)

## 2016-03-09 MED ORDER — DOXYCYCLINE HYCLATE 100 MG PO TABS
100.0000 mg | ORAL_TABLET | Freq: Two times a day (BID) | ORAL | Status: DC
  Administered 2016-03-10 (×2): 100 mg via ORAL
  Filled 2016-03-09 (×2): qty 1

## 2016-03-09 MED ORDER — DEXTROSE 5 % IV SOLN
2.0000 g | INTRAVENOUS | Status: DC
  Administered 2016-03-09 – 2016-03-10 (×2): 2 g via INTRAVENOUS
  Filled 2016-03-09 (×2): qty 2

## 2016-03-09 MED ORDER — DOXYCYCLINE HYCLATE 100 MG PO TABS
200.0000 mg | ORAL_TABLET | ORAL | Status: AC
  Administered 2016-03-09: 200 mg via ORAL
  Filled 2016-03-09: qty 2

## 2016-03-09 NOTE — Progress Notes (Signed)
Family Medicine Teaching Service Daily Progress Note Intern Pager: [phone]  Patient name: [name] Medical record number: [id number] Date of birth: 07/21/1965 Age: 50 y.o. Gender: male  Primary Care Provider: Jaclyn ShaggyEnobong, Amao, MD Consultants: None Code Status: FULL  Pt Overview and Major Events to Date:  10/27: Place in observation  Assessment and Plan: [name] is a [age] y.o. male presenting with LE edema . PMH is significant for chronic lymphedema, HTN, HLD, GERD, OSA, homelessness, noncompliance with meds   # Sinus tachycardia, meeting SIRS criteria: HR down to 103 this am. BP 136/79.  am EKG with HR 99.  WBC up 20.7 this am.  Last echo 03/2015 w/ G1DD, EF 60-65%, normal systolic function, mild concentric hypertrophy. TSH 0.476.  Unsure of source of infection at this time. ?viral vs cellulitis vs HAP. - Place in observation under Dr McDiarmid - Telemetry - VS per floor protocol - Continue home dose of lasix - repeat CXR 2-V, obtain LA - Will consider broadening abx pending repeat CXR.   # HTN: BP 136/79. Supposed to be on Coreg 6.25mg  and Lasix 40mg  BID outpt - Continue home meds  # Chronic Lymphedema w/ erythema: Concern for possible cellulitis.  LLE TTP ?DVT. On lasix outpatient but noncompliant. S/p 120mg  PO and 40mg  IV in ED - Resume home dose lasix - I/Os - am Weight -1 lb since admission, -1 L since admission - Elevate LE - Awaiting bilateral LE u/s - Keflex po 500mg  QID (10/27>). Continue for 10 days.  # HLD: not on a statin. Lipid 03/2015 w/ low HDL - consider starting one here. Though unsure if patient will take  # OSA: not on CPAP outpatient because he is homeless.  Refused CPAP last evening. - CPAP while hospitalized  # Chronic deconditioning: uses walker for ambulation outpatient - PT/OT to see - OOB w/ assistance  # Homelessness/ noncompliance vs cannot afford meds:  - c/s [name] for assistance - c/s CM for assistance  # Tobacco  use d/o: 1ppd smoker - Nicotine 21mg  qd - Smoking cessation  FEN/GI: SLIV, HH diet Prophylaxis: Lovenox sub-q  Disposition: Home either tonight or tomorrow.  Pending LE ultrasound  Subjective:  Patient reports that he is feeling better compared to yesterday.  Denies CP, SOB, abdominal pain, nausea, vomiting, dizziness.  He reports good UOP.  He continues to have LLE pain.  Endorsing cough this am.  Objective: Temp:  [98.3 F (36.8 C)-99.2 F (37.3 C)] 98.3 F (36.8 C) (10/28 0617) Pulse Rate:  [100-151] 103 (10/28 0617) Resp:  [17-28] 18 (10/28 0617) BP: (136-188)/(71-115) 136/79 (10/28 0617) SpO2:  [94 %-100 %] 97 % (10/28 0617) Weight:  [296 lb 8.3 oz (134.5 kg)-298 lb 11.6 oz (135.5 kg)] 298 lb 11.6 oz (135.5 kg) (10/28 0617) Physical Exam: General: resting in bed Cardiovascular: RRR, no murmurs Respiratory: CTAB, normal WOB on room air Abdomen: obese, soft, NT/ND, +BS Extremities: warm, bilateral LE with marked lymphedema, no weeping, no skin breakdown, +TTP to LLE but appears equal in size to RLE.  Both LE are erythematous.   Intake/Output Summary (Last 24 hours) at 03/09/16 0921 Last data filed at 03/08/16 1638  Gross per 24 hour  Intake              500 ml  Output              800 ml  Net             -300 ml  Laboratory:  Recent Labs Lab 03/02/16 1323 03/08/16 0834 03/08/16 1437  WBC  --   --  13.5*  HGB 12.2* 13.9 12.8*  HCT 36.0* 41.0 39.5  PLT  --   --  313    Recent Labs Lab 03/08/16 0834 03/08/16 1437 03/09/16 0555  NA 139 138 137  K 4.2 4.6 3.8  CL 101 101 102  CO2  --  25 26  BUN 9 8 8   CREATININE 0.80 0.75 0.87  CALCIUM  --  9.1 8.5*  GLUCOSE 114* 67 98    Imaging/Diagnostic Tests:   Raliegh IpAshly M Gottschalk, DO 03/09/2016, 9:21 AM PGY-3, [place] [facility] FPTS Intern pager: [phone], text pages welcome

## 2016-03-09 NOTE — Progress Notes (Signed)
RT placed Pt on CPAP via Nasal mask at 8 CMH20.  Pt began coughing and stated that he couldn't breathe.  Pt then stated that he did not want to use the CPAP tonight but will try again tomorrow.  CPAP machine at bedside, RT to monitor and assess as needed.

## 2016-03-09 NOTE — Progress Notes (Signed)
PT Cancellation Note  Patient Details Name: [name] MRN: [id number] DOB: 05/20/1965   Cancelled Treatment:    Reason Eval/Treat Not Completed: Patient declined, no reason specified.  "It ain't happening" 03/09/2016  Taft BingKen Briannie Gutierrez, PT [phone]  (pager)  Eligio Angert, Eliseo GumKenneth V 03/09/2016, 5:14 PM

## 2016-03-09 NOTE — Progress Notes (Signed)
Pt. Refused cpap. 

## 2016-03-09 NOTE — Progress Notes (Addendum)
VASCULAR LAB PRELIMINARY  PRELIMINARY  PRELIMINARY  PRELIMINARY  Bilateral lower extremity venous duplex completed.    Preliminary report:  Technically limited due to body habitus, involuntary movement, and condition of the skin. Bilateral:  No obvious evidence of DVT, superficial thrombosis int he visualized veins. Not all areas could be seen. No obvious evidence of a Baker's cyst Baker's Cyst. Exam may be somewhat inconclusive   [name], RVS 03/09/2016, 12:58 PM

## 2016-03-10 ENCOUNTER — Inpatient Hospital Stay (HOSPITAL_COMMUNITY): Payer: Medicaid Other

## 2016-03-10 DIAGNOSIS — L03116 Cellulitis of left lower limb: Secondary | ICD-10-CM

## 2016-03-10 DIAGNOSIS — L02416 Cutaneous abscess of left lower limb: Secondary | ICD-10-CM

## 2016-03-10 DIAGNOSIS — R05 Cough: Secondary | ICD-10-CM

## 2016-03-10 DIAGNOSIS — R059 Cough, unspecified: Secondary | ICD-10-CM

## 2016-03-10 DIAGNOSIS — E785 Hyperlipidemia, unspecified: Secondary | ICD-10-CM

## 2016-03-10 LAB — CBC WITH DIFFERENTIAL/PLATELET
BASOS ABS: 0 10*3/uL (ref 0.0–0.1)
Basophils Relative: 0 %
EOS PCT: 0 %
Eosinophils Absolute: 0 10*3/uL (ref 0.0–0.7)
HCT: 34.4 % — ABNORMAL LOW (ref 39.0–52.0)
HEMOGLOBIN: 11.3 g/dL — AB (ref 13.0–17.0)
LYMPHS ABS: 1.2 10*3/uL (ref 0.7–4.0)
LYMPHS PCT: 7 %
MCH: 29.1 pg (ref 26.0–34.0)
MCHC: 32.8 g/dL (ref 30.0–36.0)
MCV: 88.7 fL (ref 78.0–100.0)
Monocytes Absolute: 1.5 10*3/uL — ABNORMAL HIGH (ref 0.1–1.0)
Monocytes Relative: 9 %
NEUTROS PCT: 84 %
Neutro Abs: 13.6 10*3/uL — ABNORMAL HIGH (ref 1.7–7.7)
PLATELETS: 245 10*3/uL (ref 150–400)
RBC: 3.88 MIL/uL — AB (ref 4.22–5.81)
RDW: 14.9 % (ref 11.5–15.5)
WBC: 16.3 10*3/uL — AB (ref 4.0–10.5)

## 2016-03-10 LAB — SEDIMENTATION RATE: SED RATE: 94 mm/h — AB (ref 0–16)

## 2016-03-10 LAB — BASIC METABOLIC PANEL
ANION GAP: 10 (ref 5–15)
BUN: 11 mg/dL (ref 6–20)
CHLORIDE: 101 mmol/L (ref 101–111)
CO2: 24 mmol/L (ref 22–32)
Calcium: 8.5 mg/dL — ABNORMAL LOW (ref 8.9–10.3)
Creatinine, Ser: 0.81 mg/dL (ref 0.61–1.24)
GFR calc non Af Amer: >60 mL/min (ref >60)
Glucose, Bld: 95 mg/dL (ref 65–99)
POTASSIUM: 3.5 mmol/L (ref 3.5–5.1)
SODIUM: 135 mmol/L (ref 135–145)

## 2016-03-10 LAB — RAPID URINE DRUG SCREEN, HOSP PERFORMED
Amphetamines: NOT DETECTED
Barbiturates: NOT DETECTED
Benzodiazepines: NOT DETECTED
Cocaine: NOT DETECTED
OPIATES: NOT DETECTED
Tetrahydrocannabinol: POSITIVE — AB

## 2016-03-10 MED ORDER — TORSEMIDE 20 MG PO TABS
20.0000 mg | ORAL_TABLET | Freq: Two times a day (BID) | ORAL | Status: DC
  Administered 2016-03-10 – 2016-03-18 (×15): 20 mg via ORAL
  Filled 2016-03-10 (×15): qty 1

## 2016-03-10 MED ORDER — KETOROLAC TROMETHAMINE 30 MG/ML IJ SOLN
50.0000 mg | Freq: Once | INTRAMUSCULAR | Status: AC
  Administered 2016-03-10: 51 mg via INTRAVENOUS

## 2016-03-10 MED ORDER — KETOROLAC TROMETHAMINE 30 MG/ML IJ SOLN
30.0000 mg | Freq: Four times a day (QID) | INTRAMUSCULAR | Status: AC | PRN
  Administered 2016-03-14 – 2016-03-15 (×2): 30 mg via INTRAVENOUS
  Filled 2016-03-10 (×2): qty 1

## 2016-03-10 MED ORDER — VANCOMYCIN HCL 10 G IV SOLR
1250.0000 mg | Freq: Three times a day (TID) | INTRAVENOUS | Status: DC
  Administered 2016-03-10 – 2016-03-12 (×5): 1250 mg via INTRAVENOUS
  Filled 2016-03-10 (×8): qty 1250

## 2016-03-10 MED ORDER — GADOBENATE DIMEGLUMINE 529 MG/ML IV SOLN
20.0000 mL | Freq: Once | INTRAVENOUS | Status: AC | PRN
  Administered 2016-03-10: 20 mL via INTRAVENOUS

## 2016-03-10 MED ORDER — VANCOMYCIN HCL 10 G IV SOLR
2500.0000 mg | Freq: Once | INTRAVENOUS | Status: AC
  Administered 2016-03-10: 2500 mg via INTRAVENOUS
  Filled 2016-03-10: qty 2500

## 2016-03-10 MED ORDER — KETOROLAC TROMETHAMINE 30 MG/ML IJ SOLN
INTRAMUSCULAR | Status: AC
  Administered 2016-03-10: 51 mg via INTRAVENOUS
  Filled 2016-03-10: qty 2

## 2016-03-10 NOTE — Progress Notes (Signed)
Pharmacy Antibiotic Note- Initial Consult   [name] is a 50 y.o. male admitted on 03/08/2016 with LLE cellulitis and abscess.  Pharmacy has been consulted for vancomycin dosing. Patient has been on doxy and ceftriaxone and is now being transitioned to vancomycin. Patient is afebrile with a WBC count of 16.3. Good renal function with a SCr of 0.81 (Normalized CrCl >100). Patient is also on torsemide so we will watch his kidney function closely. Considering I& D and culture per note.   Plan: Vancomycin 2500 mg X 1  Then vancomycin 1250 q 8 H (Goal: 10-15) [facility]  Monitor clinical s/s of improvement, length of therapy, renal function  Height: 5\' 9"  (175.3 cm) Weight: 298 lb 8.1 oz (135.4 kg) IBW/kg (Calculated) : 70.7  Temp (24hrs), Avg:99 F (37.2 C), Min:98.2 F (36.8 C), Max:100.3 F (37.9 C)   Recent Labs Lab 03/08/16 0834 03/08/16 1437 03/09/16 0555 03/09/16 0946 03/10/16 0506  WBC  --  13.5* 20.7*  --  16.3*  CREATININE 0.80 0.75 0.87  --  0.81  LATICACIDVEN  --   --   --  1.2  --     Estimated Creatinine Clearance: 150.7 mL/min (by C-G formula based on SCr of 0.81 mg/dL).    No Known Allergies  Antimicrobials this admission: 10/27 Cephalexin>> 10/28 10/27 Doxycycline >> 10/27 10/27 CTX >> 10/27 10/28 Vanc>>  Dose adjustments this admission:   Microbiology results: No cultures collected- Considering I& D and wound culture   Thank you for allowing pharmacy to be a part of this patient's care.  Hillis RangeEmily A Stewart, PharmD PGY1 Pharmacy Resident [phone]:[phone]  03/10/2016 11:21 AM

## 2016-03-10 NOTE — Clinical Social Work Note (Signed)
Clinical Social Worker met with patient regarding referral for homelessness.  [name] spoke with patient at bedside, who states that he rides the city bus all day and then gets off at 11:30pm and sleeps at one of the stops.  He has used his shelter days at [facility] and is not willing to go outside of [place] at this time, therefore options are limited.  Patient states there are no family/friends willing to take him in at this time.  [name] offered to assist with shelter placement outside of [place] and patient continues to refuse.  [name] available for resources prior to discharge if patient has a change of heart for placement.  Barbette Or, [place]

## 2016-03-10 NOTE — Procedures (Signed)
Patient refused CPAP.

## 2016-03-10 NOTE — Progress Notes (Signed)
Attending called for pain meds for this patient to be able to complete MRI. Verbal order for Toradol 50mg  IV x 1. Pulled 60mg /782ml from pyxis drew up 1.447ml order dose with witness Billee Cashingachel Lesperance, RN. MD VO 30mg  q6 PRN.

## 2016-03-10 NOTE — Final Progress Note (Signed)
Patient briefly examined Left leg has small area of exudative ulceration distal left tibia posteriorly within the leg that has significant lymphedema - no tissue crepitus present MRI pending Full consult to follow

## 2016-03-10 NOTE — Progress Notes (Signed)
Family Medicine Teaching Service Daily Progress Note Intern Pager: [phone]  Patient name: Shawna ClampRonald E Ostlund Medical record number: [id number] Date of birth: 10/20/1965 Age: 50 y.o. Gender: male  Primary Care Provider: Jaclyn ShaggyEnobong, Amao, MD Consultants: None Code Status: FULL  Pt Overview and Major Events to Date:  10/27: Place in observation  Assessment and Plan: Shawna ClampRonald E Buerger is a 50 y.o. male presenting with LE edema . PMH is significant for chronic lymphedema, HTN, HLD, GERD, OSA, homelessness, noncompliance with meds    Left lower extremity abscess:  Posterior leg has 2.5cm open wound draining pus. There is surrounding fluctuance and induration. Patient afebrile and VSS.  Leg is very tender to palpation.  Consider I&D and wound culture.  - doxycycline 100mg  BID day 2 - continue ceftriaxone for strep coverage  Chronic Lymphedema w/ erythema  On lasix outpatient but noncompliant. S/p 120mg  PO and 40mg  IV in ED.  Lasix 40mg  BID, measured UOP was 100cc.  - Resume home dose lasix - I/Os - Elevate LE - doxycycline 100mg  BID day 2 - continue ceftriaxone for strep coverage  Sinus tachycardia, meeting SIRS criteria: HR down to 103 this am. BP 138/73.  am EKG with HR 99.  WBC: 20.7>16.3  Unsure of source of infection at this time. Normal CXR. LE doppler showed no DVT.  - Telemetry - VS per floor protocol - Continue home dose of lasix  HTN: BP 138/73. Supposed to be on Coreg 6.25mg  and Lasix 40mg  BID outpt - Continue home meds  HLD: not on a statin. Lipid 03/2015 w/ low HDL - consider starting one here. Though unsure if patient will take  OSA: not on CPAP outpatient because he is homeless.  Refused CPAP last evening. - CPAP while hospitalized  Chronic deconditioning: uses walker for ambulation outpatient.  PT cancelled treatment.  Patient declined "It ain't happening".  - OOB w/ assistance  Homelessness/ noncompliance vs cannot afford meds - c/s [name] for assistance -  c/s CM for assistance  Tobacco use d/o: 1ppd smoker - Nicotine 21mg  qd - Smoking cessation  FEN/GI: SLIV, HH diet Prophylaxis: Lovenox sub-q  Disposition: pending medical improvement  Subjective:  Patient reports that he is feeling better compared to yesterday.  However, when LE were examined there was an open wound on his posterior left leg that was draining pus.   Objective: Temp:  [98.2 F (36.8 C)-100.3 F (37.9 C)] 98.4 F (36.9 C) (10/29 0457) Pulse Rate:  [99-101] 99 (10/29 0457) Resp:  [20] 20 (10/29 0457) BP: (134-140)/(73-94) 138/73 (10/29 0457) SpO2:  [94 %-97 %] 94 % (10/29 0457) Weight:  [298 lb 8.1 oz (135.4 kg)] 298 lb 8.1 oz (135.4 kg) (10/29 0250) Physical Exam: General: resting in bed Cardiovascular: RRR, no murmurs Respiratory: CTAB, normal WOB on room air Abdomen: obese, soft, NT/ND, +BS Extremities: warm, bilateral LE with marked lymphedema and left leg with erythema and open 2.5cm wound on posterior left leg draining pus.  LLE tender to palpation.    Intake/Output Summary (Last 24 hours) at 03/10/16 0648 Last data filed at 03/10/16 0436  Gross per 24 hour  Intake              904 ml  Output              100 ml  Net              804 ml   Laboratory:  Recent Labs Lab 03/08/16 1437 03/09/16 0555 03/10/16 0506  WBC 13.5* 20.7*  16.3*  HGB 12.8* 12.3* 11.3*  HCT 39.5 37.4* 34.4*  PLT 313 256 245    Recent Labs Lab 03/08/16 0834 03/08/16 1437 03/09/16 0555  NA 139 138 137  K 4.2 4.6 3.8  CL 101 101 102  CO2  --  25 26  BUN 9 8 8   CREATININE 0.80 0.75 0.87  CALCIUM  --  9.1 8.5*  GLUCOSE 114* 67 98    Imaging/Diagnostic Tests: Dg Chest 2 View  Result Date: 03/09/2016 CLINICAL DATA:  Bilateral lower extremity swelling. EXAM: CHEST  2 VIEW COMPARISON:  03/08/2016. FINDINGS: Normal sized heart. Clear lungs with normal vascularity. Mild thoracic spine degenerative changes. IMPRESSION: No acute abnormality. Electronically Signed   By:  Beckie SaltsSteven  Reid M.D.   On: 03/09/2016 11:20   Dg Chest Port 1 View  Result Date: 03/08/2016 CLINICAL DATA:  Hypertension EXAM: PORTABLE CHEST 1 VIEW COMPARISON:  02/03/2016 FINDINGS: Borderline cardiomegaly. No infiltrate or pleural effusion. No pulmonary edema. Mild basilar atelectasis. IMPRESSION: Borderline cardiomegaly. Bilateral basilar atelectasis. No infiltrate or pulmonary edema. Electronically Signed   By: Natasha MeadLiviu  Pop M.D.   On: 03/08/2016 15:02    Renne Muscaaniel L Jerrie Schussler, MD 03/10/2016, 6:48 AM PGY-1, [facility] FPTS Intern pager: [phone], text pages welcome

## 2016-03-11 ENCOUNTER — Encounter (HOSPITAL_COMMUNITY): Payer: Self-pay | Admitting: Family Medicine

## 2016-03-11 NOTE — [place] FL2 (Signed)
[place] MEDICAID FL2 LEVEL OF CARE SCREENING TOOL     IDENTIFICATION  Patient Name: [name] Birthdate: 12/12/1965 Sex: male Admission Date (Current Location): 03/08/2016  [facility] [place]   Facility and Address:  [facility], [address], [place], [place] [address]      Provider Number: [id number]  Attending Physician Name and Address:  Leighton Roachodd D McDiarmid, MD  Relative Name and Phone Number:       Current Level of Care: SNF Recommended Level of Care: Skilled Nursing Facility Prior Approval Number:    Date Approved/Denied:   PASRR Number: [id number]  Discharge Plan: SNF    Current Diagnoses: Patient Active Problem List   Diagnosis Date Noted  . Cellulitis and abscess of left leg   . Cough   . Morbid obesity (HCC) 03/09/2016  . Lymphedema 03/08/2016  . Sinus tachycardia 03/08/2016  . Tachycardia 03/08/2016  . Lower extremity pain, diffuse, left   . Erythema of lower extremity   . Noncompliance with medication regimen 08/21/2015  . Homelessness 04/24/2015  . Frozen shoulder 04/24/2015  . Right arm weakness   . Dizziness 03/26/2015  . Weakness 03/26/2015  . TIA (transient ischemic attack) 04/05/2014  . CVA (cerebral infarction) 04/05/2014  . Chronic acquired lymphedema 08/08/2012  . Obstructive sleep apnea 06/08/2009  . Dyslipidemia 01/17/2009  . GERD 12/08/2008  . ERECTILE DYSFUNCTION, ORGANIC 12/08/2008  . PERIPHERAL EDEMA 10/27/2008  . Essential hypertension 07/16/2007    Orientation RESPIRATION BLADDER Height & Weight     Self, Time, Situation, Place  Normal Continent Weight: 134.5 kg (296 lb 8.3 oz) Height:  5\' 9"  (175.3 cm)  BEHAVIORAL SYMPTOMS/MOOD NEUROLOGICAL BOWEL NUTRITION STATUS      Continent Diet (Please see DC summary)  AMBULATORY STATUS COMMUNICATION OF NEEDS Skin   Extensive Assist Verbally Other (Comment) (Open wound on ankle)                       Personal Care  Assistance Level of Assistance  Bathing, Feeding, Dressing Bathing Assistance: Maximum assistance Feeding assistance: Independent Dressing Assistance: Limited assistance     Functional Limitations Info             SPECIAL CARE FACTORS FREQUENCY  PT (By licensed PT)     PT Frequency: min 5x/week              Contractures      Additional Factors Info  Code Status, Allergies Code Status Info: Full Allergies Info: NKA           Current Medications (03/11/2016):  This is the current hospital active medication list Current Facility-Administered Medications  Medication Dose Route Frequency Provider Last Rate Last Dose  . 0.9 %  sodium chloride infusion  250 mL Intravenous PRN Raliegh IpAshly M Gottschalk, DO 10 mL/hr at 03/09/16 1512 250 mL at 03/09/16 1512  . acetaminophen (TYLENOL) tablet 650 mg  650 mg Oral Q6H PRN Raliegh IpAshly M Gottschalk, DO       Or  . acetaminophen (TYLENOL) suppository 650 mg  650 mg Rectal Q6H PRN Raliegh IpAshly M Gottschalk, DO      . carvedilol (COREG) tablet 6.25 mg  6.25 mg Oral BID WC Ashly M Gottschalk, DO   6.25 mg at 03/11/16 0840  . ketorolac (TORADOL) 30 MG/ML injection 30 mg  30 mg Intravenous Q6H PRN Todd D McDiarmid, MD      . nicotine (NICODERM CQ - dosed in mg/24  hours) patch 21 mg  21 mg Transdermal Daily Ashly M Gottschalk, DO      . ondansetron [facility](ZOFRAN) tablet 4 mg  4 mg Oral Q6H PRN Raliegh IpAshly M Gottschalk, DO       Or  . ondansetron (ZOFRAN) injection 4 mg  4 mg Intravenous Q6H PRN Ashly M Gottschalk, DO      . polyethylene glycol (MIRALAX / GLYCOLAX) packet 17 g  17 g Oral Daily PRN Raliegh IpAshly M Gottschalk, DO      . sodium chloride flush (NS) 0.9 % injection 3 mL  3 mL Intravenous Q12H Ashly M Gottschalk, DO   3 mL at 03/10/16 2200  . sodium chloride flush (NS) 0.9 % injection 3 mL  3 mL Intravenous Q12H Ashly M Gottschalk, DO   3 mL at 03/10/16 2200  . sodium chloride flush (NS) 0.9 % injection 3 mL  3 mL Intravenous PRN Raliegh IpAshly M Gottschalk, DO      .  torsemide (DEMADEX) tablet 20 mg  20 mg Oral BID Renne Muscaaniel L Warden, MD   20 mg at 03/11/16 0839  . vancomycin (VANCOCIN) 1,250 mg in sodium chloride 0.9 % 250 mL IVPB  1,250 mg Intravenous Q8H Hillis RangeEmily A Stewart, RPH   1,250 mg at 03/11/16 [id number]     Discharge Medications: Please see discharge summary for a list of discharge medications.  Relevant Imaging Results:  Relevant Lab Results:   Additional Information SSN: [id number]6  [place]akeitha Basques S [place], [place]
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
## 2016-03-11 NOTE — Clinical Social Work Note (Signed)
Clinical Social Work Assessment  Patient Details  Name: [name] MRN: [id number] Date of Birth: 08/18/1965  Date of referral:  03/11/16               Reason for consult:  Facility Placement                Permission sought to share information with:  Facility Art therapist granted to share information::  Yes, Verbal Permission Granted  Name::        Agency::  SNFs  Relationship::     Contact Information:     Housing/Transportation Living arrangements for the past 2 months:  Homeless Source of Information:  Patient Patient Interpreter Needed:  None Criminal Activity/Legal Involvement Pertinent to Current Situation/Hospitalization:  No - Comment as needed Significant Relationships:  Other Family Members Lives with:  Self Do you feel safe going back to the place where you live?  No Need for family participation in patient care:  No (Coment)  Care giving concerns:  [name] received consult for possible SNF placement at time of discharge. [name] met with patient regarding PT recommendation of SNF placement at time of discharge. Patient states that he is homeless and lives on and off the bus. He has no one to help him and would benefit from Skilled care. He stated he does not receive a check from Medicaid. Patient expressed understanding of PT recommendation and is agreeable to SNF placement at time of discharge. [name] to continue to follow and assist with discharge planning needs.   Social Worker assessment / plan:  [name] spoke with patient concerning possibility of rehab at SNF before to his prior living situation.  Employment status:  Unemployed Forensic scientist:  Medicaid In [place] PT Recommendations:  [place] / Referral to community resources:  [place]  Patient/Family's Response to care:  Patient recognizes need for rehab and is agreeable to a SNF stay.  Patient/Family's Understanding of and Emotional Response  to Diagnosis, Current Treatment, and Prognosis:  Patient/family is realistic regarding therapy needs and expressed being hopeful for SNF placement. Patient expressed understanding of [name] role and discharge process. No questions/concerns about plan or treatment.    Emotional Assessment Appearance:  Appears stated age Attitude/Demeanor/Rapport:  Other (Appropriate) Affect (typically observed):  Accepting, Appropriate Orientation:  Oriented to Self, Oriented to Place, Oriented to  Time, Oriented to Situation Alcohol / Substance use:  Tobacco Use Psych involvement (Current and /or in the community):  No (Comment)  Discharge Needs  Concerns to be addressed:  Care Coordination Readmission within the last 30 days:  No Current discharge risk:  Dependent with Mobility, Homeless Barriers to Discharge:  Continued Medical Work up   [employer], [place] 03/11/2016, 2:25 PM

## 2016-03-11 NOTE — Consult Note (Signed)
WOC Nurse wound consult note Reason for Consult: LE wound Patient with history of lymphedema and open wound now.  Evaluation per ortho this am and WOC has discussed wound current status with Dr. Lajoyce Cornersuda.  [facility] and with pumps and manual massage as an outpatient per records.  Dressing procedure/placement/frequency: Per discussion with Dr. Lajoyce Cornersuda dry dressings only for now. Change daily. New orders written.   Re consult if needed, will not follow at this time. Thanks  Tobechukwu Emmick M.D.C. [employer] MSN, RN,CWOCN, CNS [phone])
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
## 2016-03-12 LAB — BASIC METABOLIC PANEL
Anion gap: 10 (ref 5–15)
Anion gap: 10 (ref 5–15)
BUN: 15 mg/dL (ref 6–20)
BUN: 11 mg/dL (ref 6–20)
CALCIUM: 8.2 mg/dL — AB (ref 8.9–10.3)
CHLORIDE: 98 mmol/L — AB (ref 101–111)
CO2: 28 mmol/L (ref 22–32)
CO2: 31 mmol/L (ref 22–32)
CREATININE: 0.91 mg/dL (ref 0.61–1.24)
CREATININE: 0.86 mg/dL (ref 0.61–1.24)
Calcium: 8.3 mg/dL — ABNORMAL LOW (ref 8.9–10.3)
Chloride: 100 mmol/L — ABNORMAL LOW (ref 101–111)
GFR calc Af Amer: >60 mL/min (ref >60)
GFR calc Af Amer: >60 mL/min (ref >60)
GFR calc non Af Amer: >60 mL/min (ref >60)
GLUCOSE: 98 mg/dL (ref 65–99)
Glucose, Bld: 107 mg/dL — ABNORMAL HIGH (ref 65–99)
POTASSIUM: 3.1 mmol/L — AB (ref 3.5–5.1)
Potassium: 3.1 mmol/L — ABNORMAL LOW (ref 3.5–5.1)
SODIUM: 138 mmol/L (ref 135–145)
Sodium: 139 mmol/L (ref 135–145)

## 2016-03-12 LAB — CBC WITH DIFFERENTIAL/PLATELET
Basophils Absolute: 0 10*3/uL (ref 0.0–0.1)
Basophils Relative: 0 %
EOS ABS: 0.1 10*3/uL (ref 0.0–0.7)
EOS PCT: 1 %
HCT: 33.9 % — ABNORMAL LOW (ref 39.0–52.0)
Hemoglobin: 11.4 g/dL — ABNORMAL LOW (ref 13.0–17.0)
LYMPHS ABS: 1.1 10*3/uL (ref 0.7–4.0)
Lymphocytes Relative: 9 %
MCH: 29.1 pg (ref 26.0–34.0)
MCHC: 33.6 g/dL (ref 30.0–36.0)
MCV: 86.5 fL (ref 78.0–100.0)
MONOS PCT: 15 %
Monocytes Absolute: 1.9 10*3/uL — ABNORMAL HIGH (ref 0.1–1.0)
Neutro Abs: 9.4 10*3/uL — ABNORMAL HIGH (ref 1.7–7.7)
Neutrophils Relative %: 75 %
PLATELETS: 265 10*3/uL (ref 150–400)
RBC: 3.92 MIL/uL — AB (ref 4.22–5.81)
RDW: 14.2 % (ref 11.5–15.5)
WBC: 12.5 10*3/uL — AB (ref 4.0–10.5)

## 2016-03-12 LAB — MAGNESIUM: Magnesium: 2.1 mg/dL (ref 1.7–2.4)

## 2016-03-12 LAB — VANCOMYCIN, TROUGH: Vancomycin Tr: 23 ug/mL (ref 15–20)

## 2016-03-12 MED ORDER — POLYETHYLENE GLYCOL 3350 17 G PO PACK: 17.0000 g | PACK | Freq: Every day | ORAL | 0 refills | Status: DC | PRN

## 2016-03-12 MED ORDER — SULFAMETHOXAZOLE-TRIMETHOPRIM 800-160 MG PO TABS: 1.0000 | ORAL_TABLET | Freq: Two times a day (BID) | ORAL | 0 refills | Status: AC

## 2016-03-12 MED ORDER — TORSEMIDE 20 MG PO TABS: 20.0000 mg | ORAL_TABLET | Freq: Two times a day (BID) | ORAL | 0 refills | Status: DC

## 2016-03-12 MED ORDER — NICOTINE 21 MG/24HR TD PT24: 21.0000 mg | MEDICATED_PATCH | Freq: Every day | TRANSDERMAL | 0 refills | Status: DC

## 2016-03-12 MED ORDER — POTASSIUM CHLORIDE CRYS ER 20 MEQ PO TBCR
40.0000 meq | EXTENDED_RELEASE_TABLET | Freq: Two times a day (BID) | ORAL | Status: AC
  Administered 2016-03-12: 40 meq via ORAL
  Filled 2016-03-12: qty 2

## 2016-03-12 MED ORDER — VANCOMYCIN HCL IN DEXTROSE 1-5 GM/200ML-% IV SOLN
1000.0000 mg | Freq: Three times a day (TID) | INTRAVENOUS | Status: DC
  Administered 2016-03-12 – 2016-03-14 (×7): 1000 mg via INTRAVENOUS
  Filled 2016-03-12 (×9): qty 200
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
## 2016-03-12 NOTE — Progress Notes (Signed)
Pharmacy Antibiotic Note  [name] is a 50 y.o. male admitted on 03/08/2016 with LLE cellulitis and abscess..  Pharmacy has been consulted for Vancomycin dosing.  MRI showed no  abscess or osteomyelitis.  Infection noted resolving.   Day # 3 Vancomycin 1250 mg IV q8hrs.  Vanc trough level tonight is 23 mcg/ml, above goal. 6pm dose held while Vanc trough in process. Creatinine stable.  Plan:  Decrease Vancomycin 1 gm IV q8hrs.  Next dose at 10pm tonight.  Vanc trough goal 15-20 mcg/ml  Follow renal function, culture data, progress, antibiotic plans.  Height: 5\' 9"  (175.3 cm) Weight: 285 lb 14.4 oz (129.7 kg) IBW/kg (Calculated) : 70.7  Temp (24hrs), Avg:99.4 F (37.4 C), Min:98.8 F (37.1 C), Max:99.9 F (37.7 C)   Recent Labs Lab 03/08/16 1437 03/09/16 0555 03/09/16 0946 03/10/16 0506 03/12/16 0739 03/12/16 1840  WBC 13.5* 20.7*  --  16.3* 12.5*  --   CREATININE 0.75 0.87  --  0.81 0.91 0.86  LATICACIDVEN  --   --  1.2  --   --   --   VANCOTROUGH  --   --   --   --   --  23*    Estimated Creatinine Clearance: 138.6 mL/min (by C-G formula based on SCr of 0.86 mg/dL).    No Known Allergies  Antimicrobials this admission:  Cephalexin 10/27 >> 10/28  Ceftriaxone 10/28>>10/29  Doxycycline 10/28>>10/29  Vancomycin 10/28 >>  Dose adjustments this admission:  10/31 VT 23 mcg/ml on 1250 mg IV q8hrs - dose decreased to 1 gm IV q8hrs  Microbiology results:  no cultures  Thank you for allowing pharmacy to be a part of this patient's care.  Dennie Fettersgan, Alexanderia Gorby Donovan, [place] Pager: [phone] 03/12/2016 8:21 PM
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
## 2016-03-12 NOTE — Consult Note (Signed)
WOC Nurse wound consult note Reason for Consult: placement of 4 layer compression to the bilateral LE.  Per Dr. Audrie Liauda's request Wound type: venous stasis with lymphedema  One small opening on the left posterior calf and some dependent swelling left inner foot with redness. Dr. Lajoyce Cornersuda has assessed this patient this am Wound bed: small fissured area aprox. 2 cm in length Drainage (amount, consistency, odor) yellow/brown, minimal  Periwound: lymphedema, venous stasis Dressing procedure/placement/frequency:  2 WOC RNs to wrap 4 layer Profore wraps to the bilateral LE. Covered open area on the LLE with foam dressing.  Will plan to change weekly.  Patient will need referral to Lymphedema clinic for manual massage and lymphedema wraps.   WOC Nurse team will follow along with you for weekly wound assessments and compression wraps.  Please notify me of any acute changes in the wounds or any new areas of concerns [name] [facility] MSN, RN,CWOCN, CNS [phone]

## 2016-03-12 NOTE — Progress Notes (Signed)
Patient refused CPAP.

## 2016-03-12 NOTE — Progress Notes (Signed)
Patient ID: [name], male   DOB: 10/19/1965, 50 y.o.   MRN: [id number] Patient seen in follow-up for lymphedema bilateral lower extremities with ulceration posterior medially on the left. The MRI scan shows no abscess or osteomyelitis. The ulcer is looking much better at this time there is no purulent drainage. The skin is wrinkling better in both lower extremities.  Assessment: Resolving infection left calf with massive lymphedema bilateral lower extremities.  Plan: Will write for Profore wraps to be applied by the wound ostomy continence nurses I can follow-up in the office after discharge. Patient will need a lymphedema clinic.
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
## 2016-03-13 DIAGNOSIS — M6281 Muscle weakness (generalized): Secondary | ICD-10-CM

## 2016-03-13 LAB — BASIC METABOLIC PANEL
ANION GAP: 8 (ref 5–15)
BUN: 9 mg/dL (ref 6–20)
CHLORIDE: 100 mmol/L — AB (ref 101–111)
CO2: 29 mmol/L (ref 22–32)
Calcium: 8.3 mg/dL — ABNORMAL LOW (ref 8.9–10.3)
Creatinine, Ser: 0.83 mg/dL (ref 0.61–1.24)
GFR calc Af Amer: >60 mL/min (ref >60)
GFR calc non Af Amer: >60 mL/min (ref >60)
GLUCOSE: 111 mg/dL — AB (ref 65–99)
POTASSIUM: 3.5 mmol/L (ref 3.5–5.1)
Sodium: 137 mmol/L (ref 135–145)

## 2016-03-13 LAB — CBC
HCT: 34.3 % — ABNORMAL LOW (ref 39.0–52.0)
HEMOGLOBIN: 11.6 g/dL — AB (ref 13.0–17.0)
MCH: 29.4 pg (ref 26.0–34.0)
MCHC: 33.8 g/dL (ref 30.0–36.0)
MCV: 86.8 fL (ref 78.0–100.0)
PLATELETS: 314 10*3/uL (ref 150–400)
RBC: 3.95 MIL/uL — AB (ref 4.22–5.81)
RDW: 14.1 % (ref 11.5–15.5)
WBC: 14.4 10*3/uL — ABNORMAL HIGH (ref 4.0–10.5)

## 2016-03-13 LAB — MAGNESIUM: Magnesium: 2.2 mg/dL (ref 1.7–2.4)

## 2016-03-13 MED ORDER — POTASSIUM CHLORIDE CRYS ER 20 MEQ PO TBCR
40.0000 meq | EXTENDED_RELEASE_TABLET | Freq: Once | ORAL | Status: AC
  Administered 2016-03-13: 40 meq via ORAL
  Filled 2016-03-13: qty 2

## 2016-03-13 NOTE — Progress Notes (Signed)
CRITICAL VALUE ALERT  Critical value received:  Vancomycin trough 23  Date of notification:  03/12/2016  Time of notification:  7:44PM  Critical value read back:Yes.    Nurse who received alert:  Tama HighKristen Kaheem Halleck,RN  MD notified (1st page):  Gambino,MD  Time of first page:  8:28PM  MD notified (2nd page):Gambino,MD  Time of second page: 8:52PM  Responding MD:  Roylene ReasonGambino,MD  Time MD responded:  8:54PM Pharmacy also notified as well. Dosage of vancomycin changed.

## 2016-03-13 NOTE — Progress Notes (Signed)
Patient refuses CPAP for the night  

## 2016-03-14 ENCOUNTER — Inpatient Hospital Stay (HOSPITAL_COMMUNITY): Payer: Medicaid Other

## 2016-03-14 LAB — CBC
HCT: 36.1 % — ABNORMAL LOW (ref 39.0–52.0)
Hemoglobin: 11.9 g/dL — ABNORMAL LOW (ref 13.0–17.0)
MCH: 29.1 pg (ref 26.0–34.0)
MCHC: 33 g/dL (ref 30.0–36.0)
MCV: 88.3 fL (ref 78.0–100.0)
PLATELETS: 360 10*3/uL (ref 150–400)
RBC: 4.09 MIL/uL — ABNORMAL LOW (ref 4.22–5.81)
RDW: 14.4 % (ref 11.5–15.5)
WBC: 12.7 10*3/uL — AB (ref 4.0–10.5)

## 2016-03-14 LAB — BASIC METABOLIC PANEL
ANION GAP: 12 (ref 5–15)
BUN: 13 mg/dL (ref 6–20)
CALCIUM: 8.5 mg/dL — AB (ref 8.9–10.3)
CO2: 29 mmol/L (ref 22–32)
Chloride: 95 mmol/L — ABNORMAL LOW (ref 101–111)
Creatinine, Ser: 1.01 mg/dL (ref 0.61–1.24)
GFR calc Af Amer: >60 mL/min (ref >60)
GLUCOSE: 182 mg/dL — AB (ref 65–99)
Potassium: 3.3 mmol/L — ABNORMAL LOW (ref 3.5–5.1)
Sodium: 136 mmol/L (ref 135–145)

## 2016-03-14 MED ORDER — POTASSIUM CHLORIDE CRYS ER 20 MEQ PO TBCR
40.0000 meq | EXTENDED_RELEASE_TABLET | Freq: Two times a day (BID) | ORAL | Status: DC
  Administered 2016-03-14 – 2016-03-18 (×8): 40 meq via ORAL
  Filled 2016-03-14 (×9): qty 2

## 2016-03-14 MED ORDER — PIPERACILLIN-TAZOBACTAM 3.375 G IVPB
3.3750 g | Freq: Three times a day (TID) | INTRAVENOUS | Status: DC
  Administered 2016-03-14 – 2016-03-17 (×9): 3.375 g via INTRAVENOUS
  Filled 2016-03-14 (×10): qty 50

## 2016-03-14 MED ORDER — ENOXAPARIN SODIUM 60 MG/0.6ML ~~LOC~~ SOLN
60.0000 mg | SUBCUTANEOUS | Status: DC
  Administered 2016-03-14 – 2016-03-18 (×5): 60 mg via SUBCUTANEOUS
  Filled 2016-03-14 (×5): qty 0.6

## 2016-03-14 NOTE — Progress Notes (Signed)
Changed culture to aerobic only per lab.  Casper HarrisonSamantha K Avier Jech, RN

## 2016-03-14 NOTE — Progress Notes (Signed)
Physical Therapy Wound Treatment Patient Details  Name: SHEENA SIMONIS MRN: [id number] Date of Birth: 28-Apr-1966  Today's Date: 03/14/2016 Time: 2527-1292 Time Calculation (min): 16 min  Subjective  Subjective: Pt pleasant and agreeable to therapy. Asking for pain meds prior to beginning.  Patient and Family Stated Goals: Heal wound Date of Onset:  (Unsure)  Pain Score: Pt was premedicated and appeared to have significant pain throughout session.   Wound Assessment  Wound / Incision (Open or Dehisced) 03/14/16 Leg Left;Lower (Active)  Dressing Type ABD;Compression wrap 03/14/2016 11:00 AM  Dressing Changed New 03/14/2016 11:00 AM  Dressing Status Clean;Dry;Intact 03/14/2016 11:00 AM  Dressing Change Frequency Daily 03/14/2016 11:00 AM  Site / Wound Assessment Brown;Yellow;Pink 03/14/2016 11:00 AM  % Wound base Red or Granulating 20% 03/14/2016 11:00 AM  % Wound base Yellow 75% 03/14/2016 11:00 AM  % Wound base Black 5% 03/14/2016 11:00 AM  % Wound base Other (Comment) 0% 03/14/2016 11:00 AM  Peri-wound Assessment Intact 03/14/2016 11:00 AM  Wound Length (cm) 1.8 cm 03/14/2016 11:00 AM  Wound Width (cm) 3.5 cm 03/14/2016 11:00 AM  Wound Depth (cm) 0.4 cm 03/14/2016 11:00 AM  Tunneling (cm) 0 03/14/2016 11:00 AM  Undermining (cm) 0 03/14/2016 11:00 AM  Margins Unattached edges (unapproximated) 03/14/2016 11:00 AM  Closure None 03/14/2016 11:00 AM  Drainage Amount Moderate 03/14/2016 11:00 AM  Drainage Description Purulent 03/14/2016 11:00 AM  Treatment Hydrotherapy (Pulse lavage);Packing (Dry gauze) 03/14/2016 11:00 AM   Hydrotherapy Pulsed lavage therapy - wound location: L lower leg (medial calf) Pulsed Lavage with Suction (psi): 8 psi (4 at times for pain control) Pulsed Lavage with Suction - Normal Saline Used: 1000 mL Pulsed Lavage Tip: Tip with splash shield   Wound Assessment and Plan  Wound Therapy - Assess/Plan/Recommendations Wound Therapy - Clinical Statement: Pt presents to  hydrotherapy with L lower leg ulceration. Pt will benefit from continued hydrotherapy to decrease bioburden and promote wound bed healing.  Wound Therapy - Functional Problem List: Decreased tolerance for functional mobility due to pain and weakness.  Factors Delaying/Impairing Wound Healing: Diabetes Mellitus;Vascular compromise Hydrotherapy Plan: Debridement;Dressing change;Patient/family education;Pulsatile lavage with suction Wound Therapy - Frequency: 6X / week Wound Therapy - Follow Up Recommendations: Skilled nursing facility Wound Plan: See above  Wound Therapy Goals- Improve the function of patient's integumentary system by progressing the wound(s) through the phases of wound healing (inflammation - proliferation - remodeling) by: Decrease Necrotic Tissue to: 0% Decrease Necrotic Tissue - Progress: Goal set today Increase Granulation Tissue to: 100% Increase Granulation Tissue - Progress: Goal set today Goals/treatment plan/discharge plan were made with and agreed upon by patient/family: Yes Time For Goal Achievement: 2 weeks Wound Therapy - Potential for Goals: Fair  Goals will be updated until maximal potential achieved or discharge criteria met.  Discharge criteria: when goals achieved, discharge from hospital, MD decision/surgical intervention, no progress towards goals, refusal/missing three consecutive treatments without notification or medical reason.  GP     Rolinda Roan 03/14/2016, 12:01 PM   Rolinda Roan, PT, DPT Acute Rehabilitation Services Pager: [phone]

## 2016-03-14 NOTE — Consult Note (Signed)
New consult for WOC nurse, however we are following the direction of Dr. Lajoyce Cornersuda for wound care and compression therapy. Verified in chart Dr. Lajoyce Cornersuda has written hydrotherapy and wound care orders for the LLE that he examined today.  No changes for the RLE.  Spoke with bedside nurse, Profore intact to the RLE which will not be due for change until next Tuesday.    WOC will follow along with you for Profore change to the RLE on next Tuesday 03/19/16 if needed.   Raksha Wolfgang Eliberto IvoryAustin MSN, RN, [employer], CNS

## 2016-03-14 NOTE — Progress Notes (Signed)
Pharmacy Antibiotic Note  Joshua ClampRonald E Mccormick is a 50 y.o. male admitted on 03/08/2016 with LLE cellulitis and abscess..  Pharmacy has been consulted for Vancomycin dosing.  MRI showed no  abscess or osteomyelitis.  Infection noted resolving.  Pharmacy asked to start Zosyn today to broaden coverage for wound infection.  Plan: Add Zosyn 3.375g IV q 8 hrs.  Follow renal function, culture data, progress, antibiotic plans.  Height: 5\' 9"  (175.3 cm) Weight: 266 lb 8.6 oz (120.9 kg) IBW/kg (Calculated) : 70.7  Temp (24hrs), Avg:99.6 F (37.6 C), Min:98 F (36.7 C), Max:101.2 F (38.4 C)   Recent Labs Lab 03/09/16 0555 03/09/16 0946 03/10/16 0506 03/12/16 0739 03/12/16 1840 03/13/16 0808 03/14/16 0713  WBC 20.7*  --  16.3* 12.5*  --  14.4* 12.7*  CREATININE 0.87  --  0.81 0.91 0.86 0.83 1.01  LATICACIDVEN  --  1.2  --   --   --   --   --   VANCOTROUGH  --   --   --   --  23*  --   --     Estimated Creatinine Clearance: 113.6 mL/min (by C-G formula based on SCr of 1.01 mg/dL).    No Known Allergies  Antimicrobials this admission:  Cephalexin 10/27 >> 10/28  Ceftriaxone 10/28>>10/29  Doxycycline 10/28>>10/29  Vancomycin 10/28 >>  Dose adjustments this admission:  10/31 VT 23 mcg/ml on 1250 mg IV q8hrs - dose decreased to 1 gm IV q8hrs  Microbiology results: 11/2 UCx > 11/2 BCx x 2 >  Thank you for allowing pharmacy to be a part of this patient's care.  Tad MooreJessica Simora Dingee, Pharm D, BCPS  Clinical Pharmacist Pager [phone]  03/14/2016 3:23 PM

## 2016-03-14 NOTE — Progress Notes (Signed)
OT Cancellation Note  Patient Details Name: [name] MRN: [id number] DOB: 01/02/1966   Cancelled Treatment:    Reason Eval/Treat Not Completed: Patient at procedure or test/ unavailable. Will try back tomorrow  Galen ManilaSpencer, Pratyush Ammon Jeanette 03/14/2016, 1:35 PM
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
## 2016-03-14 NOTE — Progress Notes (Signed)
Patient ID: [name], male   DOB: 03/15/1966, 50 y.o.   MRN: [id number] Patient is seen in follow-up for ulceration left calf and lymphedema with venous insufficiency bilateral lower extremities. Patient has had increasing white blood cell count to 14,000 MAXIMUM TEMPERATURE last night 101.2. Examination the compression wrap was removed from the left lower extremity. The ulcer actually looks better there is approximately 50% granulation tissue fitted percent fibrinous exudative tissue. With palpation around the wound and palpation of the entire leg his leg is tender but there is no fluctuance no purulent drainage from the left calf ulcer. Clinically the leg tenderness is suggestive of cellulitis involving the leg however the wound does not show any signs of purulent abscess. Orders written for hydrotherapy for the left calf wound.

## 2016-03-15 DIAGNOSIS — L0291 Cutaneous abscess, unspecified: Secondary | ICD-10-CM

## 2016-03-15 LAB — BASIC METABOLIC PANEL
Anion gap: 12 (ref 5–15)
BUN: 19 mg/dL (ref 6–20)
CALCIUM: 8.8 mg/dL — AB (ref 8.9–10.3)
CO2: 30 mmol/L (ref 22–32)
CREATININE: 1.15 mg/dL (ref 0.61–1.24)
Chloride: 94 mmol/L — ABNORMAL LOW (ref 101–111)
GFR calc non Af Amer: >60 mL/min (ref >60)
Glucose, Bld: 92 mg/dL (ref 65–99)
Potassium: 4 mmol/L (ref 3.5–5.1)
SODIUM: 136 mmol/L (ref 135–145)

## 2016-03-15 LAB — CBC
HCT: 37.5 % — ABNORMAL LOW (ref 39.0–52.0)
Hemoglobin: 12.6 g/dL — ABNORMAL LOW (ref 13.0–17.0)
MCH: 29.4 pg (ref 26.0–34.0)
MCHC: 33.6 g/dL (ref 30.0–36.0)
MCV: 87.4 fL (ref 78.0–100.0)
PLATELETS: 443 10*3/uL — AB (ref 150–400)
RBC: 4.29 MIL/uL (ref 4.22–5.81)
RDW: 14.1 % (ref 11.5–15.5)
WBC: 11.2 10*3/uL — AB (ref 4.0–10.5)

## 2016-03-15 LAB — VANCOMYCIN, TROUGH: Vancomycin Tr: 23 ug/mL (ref 15–20)

## 2016-03-15 MED ORDER — VANCOMYCIN HCL 10 G IV SOLR
1250.0000 mg | Freq: Two times a day (BID) | INTRAVENOUS | Status: DC
  Administered 2016-03-15 – 2016-03-17 (×4): 1250 mg via INTRAVENOUS
  Filled 2016-03-15 (×6): qty 1250
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
## 2016-03-15 NOTE — Progress Notes (Signed)
Pharmacy Antibiotic Note  [name] is a 50 y.o. male admitted on 03/08/2016 with LLE cellulitis and abscess..  Pt on Vancomycin (Day #6) and added Zosyn (Day #2).  MRI showed no abscess or osteomyelitis.  Vancomycin trough 23 mcg/ml (SUPRAtherapeutic) on 1gm IV q8h. Next dose of 1gm Vanc already given ~2200.  SCr up slightly to 1.01.  Plan: Change Vancomycin to 1250mg  IV q12h. Next dose tomorrow at 1200. Zosyn 3.375g IV q 8 hrs.  Follow renal function, culture data, pt's clinical condition F/u Vanc trough at Css on new dose  Height: 5\' 9"  (175.3 cm) Weight: 266 lb 8.6 oz (120.9 kg) IBW/kg (Calculated) : 70.7  Temp (24hrs), Avg:99.4 F (37.4 C), Min:98 F (36.7 C), Max:101.2 F (38.4 C)   Recent Labs Lab 03/09/16 0555 03/09/16 0946 03/10/16 0506 03/12/16 0739 03/12/16 1840 03/13/16 0808 03/14/16 0713 03/14/16 2123  WBC 20.7*  --  16.3* 12.5*  --  14.4* 12.7*  --   CREATININE 0.87  --  0.81 0.91 0.86 0.83 1.01  --   LATICACIDVEN  --  1.2  --   --   --   --   --   --   VANCOTROUGH  --   --   --   --  23*  --   --  23*    Estimated Creatinine Clearance: 113.6 mL/min (by C-G formula based on SCr of 1.01 mg/dL).    No Known Allergies  Antimicrobials this admission:  Cephalexin 10/27 >> 10/28  Ceftriaxone 10/28>>10/29  Doxycycline 10/28>>10/29  Vancomycin 10/28 >>  Dose adjustments this admission:  10/31 VT 23 mcg/ml on 1250 mg IV q8hrs - dose decreased to 1 gm IV q8hrs  Microbiology results: 11/2 UCx > 11/2 BCx x 2 >  Thank you for allowing pharmacy to be a part of this patient's care.  Tad MooreJessica Carney, Pharm D, BCPS  Clinical Pharmacist Pager [phone]  03/15/2016 12:50 AM

## 2016-03-15 NOTE — Progress Notes (Signed)
[name] is continuing to follow for SNF placement- per MD possibly ready for DC over the weekend[facility] if [facility].  [name] will continue to follow  Burna SisJenna H. Fianna Snowball, LCSW Clinical Social Worker [phone]

## 2016-03-15 NOTE — Progress Notes (Signed)
Physical Therapy Wound Treatment Patient Details  Name: [name] MRN: [id number] Date of Birth: 01/16/1966  Today's Date: 03/15/2016 Time: 1130-1211 Time Calculation (min): 41 min  Subjective  Subjective: Pt pleasant and agreeable to therapy. Asking for pain meds prior to beginning.  Patient and Family Stated Goals: Heal wound Date of Onset:  (Unsure)  Pain Score: Painful even with premedication Wound Assessment  Wound / Incision (Open or Dehisced) 03/14/16 Leg Left;Lower (Active)  Dressing Type ABD;Compression wrap 03/15/2016  2:27 PM  Dressing Changed Changed 03/15/2016  2:27 PM  Dressing Status Clean;Dry;Intact 03/15/2016  2:27 PM  Dressing Change Frequency Daily 03/15/2016  2:27 PM  Site / Wound Assessment Brown;Yellow;Pink 03/15/2016  2:27 PM  % Wound base Red or Granulating 20% 03/15/2016  2:27 PM  % Wound base Yellow 75% 03/15/2016  2:27 PM  % Wound base Black 5% 03/15/2016  2:27 PM  % Wound base Other (Comment) 0% 03/15/2016  2:27 PM  Peri-wound Assessment Intact 03/15/2016  2:27 PM  Wound Length (cm) 1.8 cm 03/14/2016 11:00 AM  Wound Width (cm) 3.5 cm 03/14/2016 11:00 AM  Wound Depth (cm) 0.4 cm 03/14/2016 11:00 AM  Tunneling (cm) 0 03/14/2016 11:00 AM  Undermining (cm) 0 03/14/2016 11:00 AM  Margins Unattached edges (unapproximated) 03/15/2016  2:27 PM  Closure None 03/15/2016  2:27 PM  Drainage Amount Moderate 03/15/2016  2:27 PM  Drainage Description Purulent 03/15/2016  2:27 PM  Treatment Hydrotherapy (Pulse lavage);Packing (Dry gauze) 03/15/2016  2:27 PM   Hydrotherapy Pulsed lavage therapy - wound location: L lower leg (medial calf) Pulsed Lavage with Suction (psi): 4 psi Pulsed Lavage with Suction - Normal Saline Used: 1000 mL Pulsed Lavage Tip: Tip with splash shield   Wound Assessment and Plan  Wound Therapy - Assess/Plan/Recommendations Wound Therapy - Clinical Statement: Continue hydrotherapy to decrease bioburden and promote wound bed healing.  Wound Therapy -  Functional Problem List: Decreased tolerance for functional mobility due to pain and weakness.  Factors Delaying/Impairing Wound Healing: Diabetes Mellitus;Vascular compromise Hydrotherapy Plan: Debridement;Dressing change;Patient/family education;Pulsatile lavage with suction Wound Therapy - Frequency: 6X / week Wound Therapy - Follow Up Recommendations: Skilled nursing facility Wound Plan: See above  Wound Therapy Goals- Improve the function of patient's integumentary system by progressing the wound(s) through the phases of wound healing (inflammation - proliferation - remodeling) by: Decrease Necrotic Tissue to: 0% Decrease Necrotic Tissue - Progress: Progressing toward goal Increase Granulation Tissue to: 100% Increase Granulation Tissue - Progress: Progressing toward goal Goals/treatment plan/discharge plan were made with and agreed upon by patient/family: Yes Time For Goal Achievement: 2 weeks Wound Therapy - Potential for Goals: Fair  Goals will be updated until maximal potential achieved or discharge criteria met.  Discharge criteria: when goals achieved, discharge from hospital, MD decision/surgical intervention, no progress towards goals, refusal/missing three consecutive treatments without notification or medical reason.  GP     Kirkman, Laura 03/15/2016, 2:32 PM   Laura Kirkman, PT, DPT Acute Rehabilitation Services Pager: [phone]    
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
## 2016-03-15 NOTE — Progress Notes (Signed)
Vanco trough =23.Pharmacy made aware
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
## 2016-03-16 DIAGNOSIS — A419 Sepsis, unspecified organism: Secondary | ICD-10-CM

## 2016-03-16 DIAGNOSIS — R509 Fever, unspecified: Secondary | ICD-10-CM

## 2016-03-16 DIAGNOSIS — B999 Unspecified infectious disease: Secondary | ICD-10-CM

## 2016-03-16 LAB — AEROBIC CULTURE W GRAM STAIN (SUPERFICIAL SPECIMEN)

## 2016-03-16 LAB — CBC
HEMATOCRIT: 36.7 % — AB (ref 39.0–52.0)
Hemoglobin: 12.3 g/dL — ABNORMAL LOW (ref 13.0–17.0)
MCH: 29.6 pg (ref 26.0–34.0)
MCHC: 33.5 g/dL (ref 30.0–36.0)
MCV: 88.2 fL (ref 78.0–100.0)
PLATELETS: 516 10*3/uL — AB (ref 150–400)
RBC: 4.16 MIL/uL — AB (ref 4.22–5.81)
RDW: 14.2 % (ref 11.5–15.5)
WBC: 11 10*3/uL — ABNORMAL HIGH (ref 4.0–10.5)

## 2016-03-16 LAB — URINE CULTURE

## 2016-03-16 LAB — BASIC METABOLIC PANEL
ANION GAP: 11 (ref 5–15)
BUN: 24 mg/dL — AB (ref 6–20)
CHLORIDE: 99 mmol/L — AB (ref 101–111)
CO2: 29 mmol/L (ref 22–32)
Calcium: 8.9 mg/dL (ref 8.9–10.3)
Creatinine, Ser: 1.28 mg/dL — ABNORMAL HIGH (ref 0.61–1.24)
GFR calc Af Amer: >60 mL/min (ref >60)
GFR calc non Af Amer: >60 mL/min (ref >60)
GLUCOSE: 105 mg/dL — AB (ref 65–99)
POTASSIUM: 4.2 mmol/L (ref 3.5–5.1)
Sodium: 139 mmol/L (ref 135–145)

## 2016-03-16 LAB — AEROBIC CULTURE  (SUPERFICIAL SPECIMEN)

## 2016-03-16 NOTE — Procedures (Signed)
Pt does not wish to wear cpap will inform RT if anything changes. 
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
## 2016-03-16 NOTE — Progress Notes (Signed)
Physical Therapy Wound Treatment Patient Details  Name: [name] MRN: [id number] Date of Birth: [birth year]  Today's Date: 03/16/2016 Time: 2353-6144 Time Calculation (min): 35 min  Subjective  Subjective: Pt. states hurts more when he sees how bad it is.  Asking for OOB following tx, RN informed. Patient and Family Stated Goals: Heal wound Date of Onset:  (unsure)  Pain Score:    Wound Assessment  Wound / Incision (Open or Dehisced) 03/14/16 Leg Left;Lower (Active)  Dressing Type Compression wrap;Gauze (Comment) 03/16/2016 10:00 AM  Dressing Changed Changed 03/16/2016 10:00 AM  Dressing Status Clean;Dry;Intact 03/16/2016 10:00 AM  Dressing Change Frequency Daily 03/16/2016 10:00 AM  Site / Wound Assessment Brown;Yellow;Pink 03/16/2016 10:00 AM  % Wound base Red or Granulating 20% 03/16/2016 10:00 AM  % Wound base Yellow 80% 03/16/2016 10:00 AM  % Wound base Black 5% 03/15/2016  2:27 PM  % Wound base Other (Comment) 0% 03/16/2016 10:00 AM  Peri-wound Assessment Intact 03/16/2016 10:00 AM  Wound Length (cm) 1.8 cm 03/14/2016 11:00 AM  Wound Width (cm) 3.5 cm 03/14/2016 11:00 AM  Wound Depth (cm) 0.4 cm 03/14/2016 11:00 AM  Tunneling (cm) 0 03/14/2016 11:00 AM  Undermining (cm) 0 03/14/2016 11:00 AM  Margins Unattached edges (unapproximated) 03/16/2016 10:00 AM  Closure None 03/16/2016 10:00 AM  Drainage Amount Minimal 03/16/2016 10:00 AM  Drainage Description Purulent 03/16/2016 10:00 AM  Treatment Hydrotherapy (Pulse lavage);Packing (Dry gauze) 03/16/2016 10:00 AM   Hydrotherapy Pulsed lavage therapy - wound location: L lower leg (medial calf) Pulsed Lavage with Suction (psi): 4 psi Pulsed Lavage with Suction - Normal Saline Used: 500 mL Pulsed Lavage Tip: Tip with splash shield   Wound Assessment and Plan  Wound Therapy - Assess/Plan/Recommendations Wound Therapy - Clinical Statement: Continue hydrotherapy to decrease bioburden and promote wound bed healing.  Factors  Delaying/Impairing Wound Healing: Diabetes Mellitus;Vascular compromise;Immobility Hydrotherapy Plan: Debridement;Dressing change;Patient/family education;Pulsatile lavage with suction Wound Therapy - Frequency: 6X / week Wound Therapy - Follow Up Recommendations: Skilled nursing facility Wound Plan: See above  Wound Therapy Goals- Improve the function of patient's integumentary system by progressing the wound(s) through the phases of wound healing (inflammation - proliferation - remodeling) by: Decrease Necrotic Tissue to: 0% Decrease Necrotic Tissue - Progress: Progressing toward goal Increase Granulation Tissue to: 100% Increase Granulation Tissue - Progress: Progressing toward goal Goals/treatment plan/discharge plan were made with and agreed upon by patient/family: Yes Time For Goal Achievement: 2 weeks Wound Therapy - Potential for Goals: Fair  Goals will be updated until maximal potential achieved or discharge criteria met.  Discharge criteria: when goals achieved, discharge from hospital, MD decision/surgical intervention, no progress towards goals, refusal/missing three consecutive treatments without notification or medical reason.  GP     Reginia Naas 03/16/2016, 10:36 AM\ Magda Kiel, PT [phone] 03/16/2016

## 2016-03-17 LAB — BASIC METABOLIC PANEL
Anion gap: 9 (ref 5–15)
BUN: 20 mg/dL (ref 6–20)
CALCIUM: 8.8 mg/dL — AB (ref 8.9–10.3)
CO2: 30 mmol/L (ref 22–32)
CREATININE: 1.18 mg/dL (ref 0.61–1.24)
Chloride: 99 mmol/L — ABNORMAL LOW (ref 101–111)
GFR calc non Af Amer: >60 mL/min (ref >60)
Glucose, Bld: 104 mg/dL — ABNORMAL HIGH (ref 65–99)
Potassium: 4.3 mmol/L (ref 3.5–5.1)
SODIUM: 138 mmol/L (ref 135–145)

## 2016-03-17 LAB — CBC
HCT: 38.5 % — ABNORMAL LOW (ref 39.0–52.0)
Hemoglobin: 12.7 g/dL — ABNORMAL LOW (ref 13.0–17.0)
MCH: 29.3 pg (ref 26.0–34.0)
MCHC: 33 g/dL (ref 30.0–36.0)
MCV: 88.9 fL (ref 78.0–100.0)
PLATELETS: 543 10*3/uL — AB (ref 150–400)
RBC: 4.33 MIL/uL (ref 4.22–5.81)
RDW: 14.1 % (ref 11.5–15.5)
WBC: 9.3 10*3/uL (ref 4.0–10.5)

## 2016-03-17 MED ORDER — CIPROFLOXACIN HCL 500 MG PO TABS
500.0000 mg | ORAL_TABLET | Freq: Two times a day (BID) | ORAL | Status: DC
  Administered 2016-03-17 – 2016-03-18 (×3): 500 mg via ORAL
  Filled 2016-03-17 (×3): qty 1

## 2016-03-18 LAB — BASIC METABOLIC PANEL
Anion gap: 11 (ref 5–15)
BUN: 19 mg/dL (ref 6–20)
CO2: 28 mmol/L (ref 22–32)
CREATININE: 1.03 mg/dL (ref 0.61–1.24)
Calcium: 9.3 mg/dL (ref 8.9–10.3)
Chloride: 99 mmol/L — ABNORMAL LOW (ref 101–111)
GFR calc Af Amer: >60 mL/min (ref >60)
Glucose, Bld: 104 mg/dL — ABNORMAL HIGH (ref 65–99)
Potassium: 4.1 mmol/L (ref 3.5–5.1)
SODIUM: 138 mmol/L (ref 135–145)

## 2016-03-18 LAB — CBC
HCT: 38.3 % — ABNORMAL LOW (ref 39.0–52.0)
Hemoglobin: 12.5 g/dL — ABNORMAL LOW (ref 13.0–17.0)
MCH: 29.1 pg (ref 26.0–34.0)
MCHC: 32.6 g/dL (ref 30.0–36.0)
MCV: 89.3 fL (ref 78.0–100.0)
PLATELETS: 609 10*3/uL — AB (ref 150–400)
RBC: 4.29 MIL/uL (ref 4.22–5.81)
RDW: 14.1 % (ref 11.5–15.5)
WBC: 10.5 10*3/uL (ref 4.0–10.5)

## 2016-03-18 MED ORDER — CIPROFLOXACIN HCL 500 MG PO TABS: 500.0000 mg | ORAL_TABLET | Freq: Two times a day (BID) | ORAL | 0 refills | Status: DC

## 2016-03-18 MED ORDER — TORSEMIDE 20 MG PO TABS: 20.0000 mg | ORAL_TABLET | Freq: Two times a day (BID) | ORAL | 0 refills | Status: DC
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
## 2016-03-18 NOTE — Progress Notes (Signed)
Physical Therapy Wound Treatment Patient Details  Name: [name] MRN: [id number] Date of Birth: [birth year]  Today's Date: 03/18/2016 Time: 0830-0900 Time Calculation (min): 30 min  Subjective  Subjective: Pleaant and agreeable.  Patient and Family Stated Goals: Heal wound Date of Onset:  (Unsure)  Pain Score: Needs premedication  Wound Assessment  Wound / Incision (Open or Dehisced) 03/14/16 Leg Left;Lower (Active)  Dressing Type Compression wrap;Gauze (Comment) 03/18/2016  9:14 AM  Dressing Changed Changed 03/18/2016  9:14 AM  Dressing Status Clean;Dry;Intact 03/18/2016  9:14 AM  Dressing Change Frequency Daily 03/18/2016  9:14 AM  Site / Wound Assessment Red;Yellow;Painful 03/18/2016  9:14 AM  % Wound base Red or Granulating 90% 03/18/2016  9:14 AM  % Wound base Yellow 10% 03/18/2016  9:14 AM  % Wound base Black 5% 03/16/2016  9:36 PM  % Wound base Other (Comment) 0% 03/16/2016  9:36 PM  Peri-wound Assessment Intact 03/18/2016  9:14 AM  Wound Length (cm) 1.8 cm 03/14/2016 11:00 AM  Wound Width (cm) 3.5 cm 03/14/2016 11:00 AM  Wound Depth (cm) 0.4 cm 03/14/2016 11:00 AM  Tunneling (cm) 0 03/14/2016 11:00 AM  Undermining (cm) 0 03/14/2016 11:00 AM  Margins Unattached edges (unapproximated) 03/18/2016  9:14 AM  Closure None 03/18/2016  9:14 AM  Drainage Amount Minimal 03/18/2016  9:14 AM  Drainage Description Purulent 03/18/2016  9:14 AM  Treatment Hydrotherapy (Pulse lavage);Packing (Dry gauze) 03/18/2016  9:14 AM   Hydrotherapy Pulsed lavage therapy - wound location: L lower leg (medial calf) Pulsed Lavage with Suction (psi): 4 psi Pulsed Lavage with Suction - Normal Saline Used: 500 mL Pulsed Lavage Tip: Tip with splash shield   Wound Assessment and Plan  Wound Therapy - Assess/Plan/Recommendations Wound Therapy - Clinical Statement: Pt very painful in wound and does not tolerate pulsed lavage well. Today targeted above wound and let the saline run over the wound bed - this was  all he could tolerate. Continue hydrotherapy to decrease bioburden and promote wound bed healing.  Wound Therapy - Functional Problem List: Decreased tolerance for functional mobility due to pain and weakness.  Factors Delaying/Impairing Wound Healing: Diabetes Mellitus;Vascular compromise;Immobility Hydrotherapy Plan: Debridement;Dressing change;Patient/family education;Pulsatile lavage with suction Wound Therapy - Frequency: 6X / week Wound Therapy - Follow Up Recommendations: Skilled nursing facility Wound Plan: See above  Wound Therapy Goals- Improve the function of patient's integumentary system by progressing the wound(s) through the phases of wound healing (inflammation - proliferation - remodeling) by: Decrease Necrotic Tissue to: 0% Decrease Necrotic Tissue - Progress: Progressing toward goal Increase Granulation Tissue to: 100% Increase Granulation Tissue - Progress: Progressing toward goal Goals/treatment plan/discharge plan were made with and agreed upon by patient/family: Yes Time For Goal Achievement: 2 weeks Wound Therapy - Potential for Goals: Fair  Goals will be updated until maximal potential achieved or discharge criteria met.  Discharge criteria: when goals achieved, discharge from hospital, MD decision/surgical intervention, no progress towards goals, refusal/missing three consecutive treatments without notification or medical reason.  GP     Rolinda Roan 03/18/2016, 9:20 AM   Rolinda Roan, PT, DPT Acute Rehabilitation Services Pager: [phone]

## 2016-03-18 NOTE — Discharge Instructions (Signed)
You were hospitalized with an abscess and skin infection of your left calf.  You were treated with antibiotics during your hospitalization, and gradually your infection improved.  We will discharge you with five more days of antibiotics to be taken twice daily.  Please complete this antibiotic course. - Please follow up in lymphedema clinic for your chronic leg lymphedema. - Please call to make a follow up appointment with your primary physician for within one week of discharge. - Please call to make a follow up appointment with Dr. Lajoyce Cornersuda, the orthopedist for within one week of discharge.      Abscess An abscess (boil or furuncle) is an infected area on or under the skin. This area is filled with yellowish-white fluid (pus) and other material (debris). HOME CARE   Only take medicines as told by your doctor.  If you were given antibiotic medicine, take it as directed. Finish the medicine even if you start to feel better.  If gauze is used, follow your doctor's directions for changing the gauze.  To avoid spreading the infection:  Keep your abscess covered with a bandage.  Wash your hands well.  Do not share personal care items, towels, or whirlpools with others.  Avoid skin contact with others.  Keep your skin and clothes clean around the abscess.  Keep all doctor visits as told. GET HELP RIGHT AWAY IF:   You have more pain, puffiness (swelling), or redness in the wound site.  You have more fluid or blood coming from the wound site.  You have muscle aches, chills, or you feel sick.  You have a fever. MAKE SURE YOU:   Understand these instructions.  Will watch your condition.  Will get help right away if you are not doing well or get worse.   This information is not intended to replace advice given to you by your health care provider. Make sure you discuss any questions you have with your health care provider.   Document Released: 10/16/2007 Document Revised:  10/29/2011 Document Reviewed: 07/13/2011 Elsevier Interactive Patient Education [employer]2016 Elsevier Inc.
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
## 2016-03-18 NOTE — Progress Notes (Signed)
[facility] Anticipated DC date: 03/18/16 Family notified: N/a patient oriented Transport by: Sharin MonsPTAR   Per MD patient ready for [facility]. RN, patient, patient's family, and facility notified of DC. Discharge Summary sent to facility. RN given number for report. DC packet on chart. Ambulance transport requested for patient.   [name] signing off.  Cristobal GoldmannNadia Shamere Dilworth, [place]WA Clinical Social Worker [phone]

## 2016-03-18 NOTE — Clinical Social Work Placement (Signed)
   CLINICAL SOCIAL WORK PLACEMENT  NOTE  Date:  03/18/2016  Patient Details  Name: [name] MRN: [id number] Date of Birth: 01/23/1966  Clinical Social Work is seeking post-discharge placement for this patient at the Skilled  Nursing Facility level of care (*[name] will initial, date and re-position this form in  chart as items are completed):  Yes   Patient/family provided with [facility] Clinical Social Work Department's list of facilities offering this level of care within the geographic area requested by the patient (or if unable, by the patient's family).  Yes   Patient/family informed of their freedom to choose among providers that offer the needed level of care, that participate in Medicare, Medicaid or managed care program needed by the patient, have an available bed and are willing to accept the patient.  Yes   Patient/family informed of [facility]'s ownership interest in [facility] and [facility], as well as of the fact that they are under no obligation to receive care at these facilities.  PASRR submitted to EDS on 03/11/16     PASRR number received on 03/11/16     Existing PASRR number confirmed on       FL2 transmitted to all facilities in geographic area requested by pt/family on 03/11/16     FL2 transmitted to all facilities within larger geographic area on       Patient informed that his/her managed care company has contracts with or will negotiate with certain facilities, including the following:        Yes   Patient/family informed of bed offers received.  [facility]     Physician recommends and patient chooses bed at      [facility].  Patient to be transferred to facility by PTAR     Patient family notified on 03/18/16 of transfer.  Name of family member notified:  N/A Patient alert and oriented     PHYSICIAN Please sign FL2     Additional  Comment:    _______________________________________________ Mearl LatinNadia S Araminta Zorn, LCSWA 03/18/2016, 10:03 AM

## 2016-03-19 LAB — CULTURE, BLOOD (ROUTINE X 2)
CULTURE: NO GROWTH
Culture: NO GROWTH

## 2016-04-03 ENCOUNTER — Encounter (INDEPENDENT_AMBULATORY_CARE_PROVIDER_SITE_OTHER): Payer: Self-pay | Admitting: Orthopedic Surgery

## 2016-04-03 ENCOUNTER — Ambulatory Visit (INDEPENDENT_AMBULATORY_CARE_PROVIDER_SITE_OTHER): Payer: Medicaid Other | Admitting: Orthopedic Surgery

## 2016-04-03 VITALS — Ht 69.0 in | Wt 258.0 lb

## 2016-04-03 DIAGNOSIS — I87333 Chronic venous hypertension (idiopathic) with ulcer and inflammation of bilateral lower extremity: Secondary | ICD-10-CM

## 2016-04-03 DIAGNOSIS — L97929 Non-pressure chronic ulcer of unspecified part of left lower leg with unspecified severity: Principal | ICD-10-CM

## 2016-04-03 DIAGNOSIS — L97919 Non-pressure chronic ulcer of unspecified part of right lower leg with unspecified severity: Secondary | ICD-10-CM
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
## 2016-04-11 ENCOUNTER — Ambulatory Visit (INDEPENDENT_AMBULATORY_CARE_PROVIDER_SITE_OTHER): Payer: Medicaid Other | Admitting: Family

## 2016-04-12 ENCOUNTER — Encounter (INDEPENDENT_AMBULATORY_CARE_PROVIDER_SITE_OTHER): Payer: Self-pay | Admitting: Family

## 2016-04-12 ENCOUNTER — Ambulatory Visit (INDEPENDENT_AMBULATORY_CARE_PROVIDER_SITE_OTHER): Payer: Self-pay | Admitting: Family

## 2016-04-12 VITALS — Ht 69.0 in | Wt 258.0 lb

## 2016-04-12 DIAGNOSIS — I87333 Chronic venous hypertension (idiopathic) with ulcer and inflammation of bilateral lower extremity: Secondary | ICD-10-CM

## 2016-04-12 DIAGNOSIS — L97929 Non-pressure chronic ulcer of unspecified part of left lower leg with unspecified severity: Principal | ICD-10-CM

## 2016-04-12 DIAGNOSIS — L97919 Non-pressure chronic ulcer of unspecified part of right lower leg with unspecified severity: Secondary | ICD-10-CM

## 2016-04-12 MED ORDER — TRAMADOL HCL 50 MG PO TABS: 50.0000 mg | ORAL_TABLET | Freq: Four times a day (QID) | ORAL | 0 refills | Status: DC | PRN
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
## 2016-04-19 ENCOUNTER — Ambulatory Visit (INDEPENDENT_AMBULATORY_CARE_PROVIDER_SITE_OTHER): Payer: Self-pay | Admitting: Family

## 2016-04-30 ENCOUNTER — Ambulatory Visit (INDEPENDENT_AMBULATORY_CARE_PROVIDER_SITE_OTHER): Payer: Self-pay | Admitting: Family

## 2016-04-30 DIAGNOSIS — I890 Lymphedema, not elsewhere classified: Secondary | ICD-10-CM

## 2016-04-30 DIAGNOSIS — L03116 Cellulitis of left lower limb: Secondary | ICD-10-CM

## 2016-04-30 DIAGNOSIS — L02416 Cutaneous abscess of left lower limb: Secondary | ICD-10-CM
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
## 2016-05-14 ENCOUNTER — Encounter: Payer: Self-pay | Admitting: Pediatric Intensive Care

## 2016-05-14 NOTE — Addendum Note (Signed)
Addended by: Elvina MattesSIMMONS, Hayle Parisi T on: 05/14/2016 01:12 PM   Modules accepted: Orders

## 2016-05-15 ENCOUNTER — Encounter (HOSPITAL_COMMUNITY): Payer: Self-pay | Admitting: Emergency Medicine

## 2016-05-15 ENCOUNTER — Emergency Department (HOSPITAL_COMMUNITY)
Admission: EM | Admit: 2016-05-15 | Discharge: 2016-05-15 | Disposition: A | Discharge status: Home / Self Care | Payer: Medicaid Other | Attending: Emergency Medicine | Admitting: Emergency Medicine

## 2016-05-15 DIAGNOSIS — Z87891 Personal history of nicotine dependence: Secondary | ICD-10-CM | POA: Diagnosis not present

## 2016-05-15 DIAGNOSIS — Z9119 Patient's noncompliance with other medical treatment and regimen: Secondary | ICD-10-CM | POA: Insufficient documentation

## 2016-05-15 DIAGNOSIS — I890 Lymphedema, not elsewhere classified: Secondary | ICD-10-CM | POA: Diagnosis not present

## 2016-05-15 DIAGNOSIS — Z8673 Personal history of transient ischemic attack (TIA), and cerebral infarction without residual deficits: Secondary | ICD-10-CM | POA: Insufficient documentation

## 2016-05-15 DIAGNOSIS — M7989 Other specified soft tissue disorders: Secondary | ICD-10-CM | POA: Diagnosis present

## 2016-05-15 DIAGNOSIS — Z7982 Long term (current) use of aspirin: Secondary | ICD-10-CM | POA: Diagnosis not present

## 2016-05-15 DIAGNOSIS — I10 Essential (primary) hypertension: Secondary | ICD-10-CM | POA: Diagnosis not present

## 2016-05-15 DIAGNOSIS — Z91199 Patient's noncompliance with other medical treatment and regimen due to unspecified reason: Secondary | ICD-10-CM

## 2016-05-15 LAB — CBC WITH DIFFERENTIAL/PLATELET
BASOS ABS: 0.1 10*3/uL (ref 0.0–0.1)
BASOS PCT: 1 %
EOS ABS: 0.2 10*3/uL (ref 0.0–0.7)
EOS PCT: 2 %
HCT: 35.3 % — ABNORMAL LOW (ref 39.0–52.0)
Hemoglobin: 11.7 g/dL — ABNORMAL LOW (ref 13.0–17.0)
Lymphocytes Relative: 25 %
Lymphs Abs: 2 10*3/uL (ref 0.7–4.0)
MCH: 28.5 pg (ref 26.0–34.0)
MCHC: 33.1 g/dL (ref 30.0–36.0)
MCV: 86.1 fL (ref 78.0–100.0)
MONO ABS: 0.6 10*3/uL (ref 0.1–1.0)
Monocytes Relative: 8 %
Neutro Abs: 5.2 10*3/uL (ref 1.7–7.7)
Neutrophils Relative %: 64 %
PLATELETS: 278 10*3/uL (ref 150–400)
RBC: 4.1 MIL/uL — ABNORMAL LOW (ref 4.22–5.81)
RDW: 16.6 % — AB (ref 11.5–15.5)
WBC: 8.1 10*3/uL (ref 4.0–10.5)

## 2016-05-15 LAB — COMPREHENSIVE METABOLIC PANEL
ALBUMIN: 3.3 g/dL — AB (ref 3.5–5.0)
ALT: 20 U/L (ref 17–63)
AST: 21 U/L (ref 15–41)
Alkaline Phosphatase: 92 U/L (ref 38–126)
Anion gap: 7 (ref 5–15)
BUN: 6 mg/dL (ref 6–20)
CHLORIDE: 105 mmol/L (ref 101–111)
CO2: 27 mmol/L (ref 22–32)
Calcium: 8.8 mg/dL — ABNORMAL LOW (ref 8.9–10.3)
Creatinine, Ser: 0.71 mg/dL (ref 0.61–1.24)
GFR calc Af Amer: >60 mL/min (ref >60)
GLUCOSE: 86 mg/dL (ref 65–99)
POTASSIUM: 4.2 mmol/L (ref 3.5–5.1)
SODIUM: 139 mmol/L (ref 135–145)
Total Bilirubin: 0.3 mg/dL (ref 0.3–1.2)
Total Protein: 7.1 g/dL (ref 6.5–8.1)

## 2016-05-15 MED ORDER — CARVEDILOL 6.25 MG PO TABS: 6.2500 mg | ORAL_TABLET | Freq: Two times a day (BID) | ORAL | 2 refills | Status: DC

## 2016-05-15 MED ORDER — TORSEMIDE 20 MG PO TABS
20.0000 mg | ORAL_TABLET | Freq: Two times a day (BID) | ORAL | Status: DC
  Administered 2016-05-15 (×2): 20 mg via ORAL
  Filled 2016-05-15 (×2): qty 1

## 2016-05-15 MED ORDER — CARVEDILOL 12.5 MG PO TABS
6.2500 mg | ORAL_TABLET | Freq: Two times a day (BID) | ORAL | Status: DC
Start: 2016-05-15 — End: 2016-05-15

## 2016-05-15 MED ORDER — TORSEMIDE 20 MG PO TABS: 20.0000 mg | ORAL_TABLET | Freq: Two times a day (BID) | ORAL | 0 refills | Status: DC

## 2016-05-15 MED ORDER — ASPIRIN 325 MG PO TABS
325.0000 mg | ORAL_TABLET | Freq: Every day | ORAL | Status: DC
  Administered 2016-05-15: 325 mg via ORAL
  Filled 2016-05-15: qty 1

## 2016-05-15 MED ORDER — CARVEDILOL 12.5 MG PO TABS
6.2500 mg | ORAL_TABLET | Freq: Two times a day (BID) | ORAL | Status: DC
Start: 2016-05-15 — End: 2016-05-16
  Administered 2016-05-15 (×2): 6.25 mg via ORAL
  Filled 2016-05-15 (×2): qty 1

## 2016-05-15 NOTE — ED Notes (Signed)
Heart healthy diet ordered for pt dinner.

## 2016-05-15 NOTE — ED Notes (Addendum)
Patient unable to assist self to wheelchair or ambualte. Attempted to move patient with 3 person assist and unable to lift and pivot patient. Pt requiring 5 person assist. MD made aware.

## 2016-05-15 NOTE — Progress Notes (Signed)
Orthopedic Tech Progress Note Patient Details:  Joshua ClampRonald E Mccormick 01/29/1966 [id number]  Ortho Devices Type of Ortho Device: Roland RackUnna boot Ortho Device/Splint Location: bilateral Ortho Device/Splint Interventions: Application   Riki Berninger 05/15/2016, 10:15 AM

## 2016-05-15 NOTE — ED Notes (Signed)
Pt just called this RN into the room to help move him up in bed.  Pt was unable to assist me in sliding himself up in bed and said he was fine where he was.

## 2016-05-15 NOTE — Discharge Instructions (Signed)
It is important to follow-up with both your primary care doctor, as well as your orthopedist, at [facility] as soon as possible.  Start taking your prescribed medications today.
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
## 2016-05-15 NOTE — ED Notes (Signed)
Ortho at bedside applying unna boot

## 2016-05-15 NOTE — Discharge Planning (Signed)
Spoke with pt at bedside regarding homeless and medication issues.  Pt familiar with homeless shelters and use [facility].  Pt familiar with daily bus routes and how to get around town.  Pt admits that he oversleeps and or is late to bus stop and misses appointments.  EDCM reminds pt that follow up is mandatory on his road to recovery.  Pt agrees and states he will do better with keeping appointments in the future.

## 2016-05-15 NOTE — ED Notes (Signed)
Pt given Mal[place]key sandwich and apple sauce per Dr. Effie ShyWentz

## 2016-05-15 NOTE — ED Notes (Signed)
Assisted Ken(EMT) with pericare on pt.

## 2016-05-15 NOTE — ED Notes (Signed)
Pt SPO2 has been in the low 90's.  I asked the pt to take a couple deep breathes and the pt's sats came up to 97%.  The pt has been sleeping and it appears his sats decline as he sleeps.

## 2016-05-15 NOTE — Progress Notes (Signed)
[facility].  Pt agreeable to plan.

## 2016-05-15 NOTE — ED Triage Notes (Signed)
Patient arrived with EMS from homeless shelter reports bilateral lower legs edema/swelling onset this morning , denies fever or chills .

## 2016-05-15 NOTE — ED Notes (Signed)
Pt verbalized understanding discharge instructions and denies any further needs or questions at this time. VS stable, 

## 2016-05-16 ENCOUNTER — Encounter (HOSPITAL_COMMUNITY): Payer: Self-pay | Admitting: Nurse Practitioner

## 2016-05-16 ENCOUNTER — Emergency Department (HOSPITAL_COMMUNITY)
Admission: EM | Admit: 2016-05-16 | Discharge: 2016-05-17 | Disposition: A | Discharge status: Home / Self Care | Payer: Medicaid Other | Source: Home / Self Care | Attending: Emergency Medicine | Admitting: Emergency Medicine

## 2016-05-16 ENCOUNTER — Emergency Department (HOSPITAL_COMMUNITY)
Admission: EM | Admit: 2016-05-16 | Discharge: 2016-05-16 | Disposition: A | Discharge status: Home / Self Care | Payer: Medicaid Other | Source: Home / Self Care | Attending: Emergency Medicine | Admitting: Emergency Medicine

## 2016-05-16 ENCOUNTER — Encounter (HOSPITAL_COMMUNITY): Payer: Self-pay

## 2016-05-16 DIAGNOSIS — Z5900 Homelessness unspecified: Secondary | ICD-10-CM

## 2016-05-16 DIAGNOSIS — Z87891 Personal history of nicotine dependence: Secondary | ICD-10-CM | POA: Diagnosis not present

## 2016-05-16 DIAGNOSIS — I10 Essential (primary) hypertension: Secondary | ICD-10-CM

## 2016-05-16 DIAGNOSIS — R600 Localized edema: Secondary | ICD-10-CM

## 2016-05-16 DIAGNOSIS — Z79899 Other long term (current) drug therapy: Secondary | ICD-10-CM | POA: Diagnosis not present

## 2016-05-16 DIAGNOSIS — I890 Lymphedema, not elsewhere classified: Secondary | ICD-10-CM | POA: Insufficient documentation

## 2016-05-16 DIAGNOSIS — Y9389 Activity, other specified: Secondary | ICD-10-CM | POA: Diagnosis not present

## 2016-05-16 DIAGNOSIS — Z9119 Patient's noncompliance with other medical treatment and regimen: Secondary | ICD-10-CM

## 2016-05-16 DIAGNOSIS — Z8673 Personal history of transient ischemic attack (TIA), and cerebral infarction without residual deficits: Secondary | ICD-10-CM

## 2016-05-16 DIAGNOSIS — G8929 Other chronic pain: Secondary | ICD-10-CM | POA: Diagnosis not present

## 2016-05-16 DIAGNOSIS — Y92002 Bathroom of unspecified non-institutional (private) residence single-family (private) house as the place of occurrence of the external cause: Secondary | ICD-10-CM | POA: Diagnosis not present

## 2016-05-16 DIAGNOSIS — Z91199 Patient's noncompliance with other medical treatment and regimen due to unspecified reason: Secondary | ICD-10-CM

## 2016-05-16 DIAGNOSIS — Y999 Unspecified external cause status: Secondary | ICD-10-CM | POA: Diagnosis not present

## 2016-05-16 DIAGNOSIS — R609 Edema, unspecified: Secondary | ICD-10-CM

## 2016-05-16 DIAGNOSIS — Z7982 Long term (current) use of aspirin: Secondary | ICD-10-CM | POA: Insufficient documentation

## 2016-05-16 DIAGNOSIS — M7989 Other specified soft tissue disorders: Secondary | ICD-10-CM | POA: Diagnosis present

## 2016-05-16 DIAGNOSIS — W1811XA Fall from or off toilet without subsequent striking against object, initial encounter: Secondary | ICD-10-CM | POA: Diagnosis not present

## 2016-05-16 NOTE — Progress Notes (Signed)
[name] provided patient with list of shelters in the area. [facility] [name] will provide bus passes. Patient agreeable to plan.  Stacy GardnerErin Mackey Varricchio, LCSWA Clinical Social Worker [phone]

## 2016-05-16 NOTE — ED Notes (Signed)
Bed: [id number] Expected date:  Expected time:  Means of arrival:  Comments: EMS- 50s, BLE weakness

## 2016-05-16 NOTE — ED Triage Notes (Signed)
PT RECEIVED FROM THE BUS TERMINAL VIA EMS FOR CHRONIC LEG SWELLING. PT JUST DISCHARGED FROM HERE 3 HRS AGO FOR SAME. DENIES INJURY.
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
## 2016-05-16 NOTE — Progress Notes (Signed)
Pt with [facility] 11 ED visits and 2 admissions in the last 6 months Cm recall pt in 2017 assisted to facility  Cm spoke with pt who states he came from"the streets" to ED today and [place]around [place]" "my medicaid ran out" "they put me on a bus" [place]
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
## 2016-05-16 NOTE — ED Triage Notes (Signed)
States legs are weak today and he is cold.

## 2016-05-17 ENCOUNTER — Encounter (HOSPITAL_COMMUNITY): Payer: Self-pay

## 2016-05-17 DIAGNOSIS — G8929 Other chronic pain: Secondary | ICD-10-CM | POA: Insufficient documentation

## 2016-05-17 DIAGNOSIS — Z79899 Other long term (current) drug therapy: Secondary | ICD-10-CM | POA: Insufficient documentation

## 2016-05-17 DIAGNOSIS — W1811XA Fall from or off toilet without subsequent striking against object, initial encounter: Secondary | ICD-10-CM | POA: Insufficient documentation

## 2016-05-17 DIAGNOSIS — Z87891 Personal history of nicotine dependence: Secondary | ICD-10-CM | POA: Insufficient documentation

## 2016-05-17 DIAGNOSIS — Y92002 Bathroom of unspecified non-institutional (private) residence single-family (private) house as the place of occurrence of the external cause: Secondary | ICD-10-CM | POA: Insufficient documentation

## 2016-05-17 DIAGNOSIS — Y999 Unspecified external cause status: Secondary | ICD-10-CM | POA: Insufficient documentation

## 2016-05-17 DIAGNOSIS — M7989 Other specified soft tissue disorders: Secondary | ICD-10-CM | POA: Insufficient documentation

## 2016-05-17 DIAGNOSIS — I10 Essential (primary) hypertension: Secondary | ICD-10-CM | POA: Insufficient documentation

## 2016-05-17 DIAGNOSIS — I890 Lymphedema, not elsewhere classified: Secondary | ICD-10-CM | POA: Insufficient documentation

## 2016-05-17 DIAGNOSIS — Z8673 Personal history of transient ischemic attack (TIA), and cerebral infarction without residual deficits: Secondary | ICD-10-CM | POA: Insufficient documentation

## 2016-05-17 DIAGNOSIS — Y9389 Activity, other specified: Secondary | ICD-10-CM | POA: Insufficient documentation

## 2016-05-17 DIAGNOSIS — Z7982 Long term (current) use of aspirin: Secondary | ICD-10-CM | POA: Insufficient documentation

## 2016-05-17 NOTE — ED Triage Notes (Signed)
Pt comes via [facility], fell in bathroom from the toilet due to R leg pain and edema in both legs. Pt was off ground by the time EMS arrived. Denies LOC, denies pain from fall.

## 2016-05-18 ENCOUNTER — Emergency Department (HOSPITAL_COMMUNITY)
Admission: EM | Admit: 2016-05-18 | Discharge: 2016-05-18 | Disposition: A | Discharge status: Home / Self Care | Payer: Medicaid Other | Attending: Emergency Medicine | Admitting: Emergency Medicine

## 2016-05-18 DIAGNOSIS — I890 Lymphedema, not elsewhere classified: Secondary | ICD-10-CM
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
## 2016-05-18 NOTE — ED Provider Notes (Signed)
I assumed care of this patient from Dr. Karma GanjaLinker at 0800.  Please see their note for further details of Hx, PE.  Pt is cleared for discharge after mechanical fall 2/2 chronic lymphedema. Pending SW consultation. Of note, pt has been seen here on multiple occasions and seen by SW. They are unable to assist pt with placement. No SW available at this time.  The patient is safe for discharge with strict return precautions.  Disposition: Discharge  Condition: Good  I have discussed the results, Dx and Tx plan with the patient who expressed understanding and agree(s) with the plan. Discharge instructions discussed at great length. The patient was given strict return precautions who verbalized understanding of the instructions. No further questions at time of discharge.    New Prescriptions   No medications on file    Follow Up: Jaclyn ShaggyEnobong Amao, MD [address] [place] [id number] [phone]  Schedule an appointment as soon as possible for a visit  As needed        Nira ConnPedro Eduardo Keiandre Cygan, MD 05/18/16 1036

## 2016-05-20 ENCOUNTER — Emergency Department (HOSPITAL_COMMUNITY)
Admission: EM | Admit: 2016-05-20 | Discharge: 2016-05-21 | Disposition: A | Discharge status: Home / Self Care | Payer: Medicaid Other | Attending: Emergency Medicine | Admitting: Emergency Medicine

## 2016-05-20 ENCOUNTER — Encounter (HOSPITAL_COMMUNITY): Payer: Self-pay | Admitting: Emergency Medicine

## 2016-05-20 DIAGNOSIS — M6281 Muscle weakness (generalized): Secondary | ICD-10-CM | POA: Diagnosis present

## 2016-05-20 DIAGNOSIS — I10 Essential (primary) hypertension: Secondary | ICD-10-CM | POA: Diagnosis not present

## 2016-05-20 DIAGNOSIS — Z8673 Personal history of transient ischemic attack (TIA), and cerebral infarction without residual deficits: Secondary | ICD-10-CM | POA: Diagnosis not present

## 2016-05-20 DIAGNOSIS — Z7982 Long term (current) use of aspirin: Secondary | ICD-10-CM | POA: Diagnosis not present

## 2016-05-20 DIAGNOSIS — I890 Lymphedema, not elsewhere classified: Secondary | ICD-10-CM | POA: Insufficient documentation

## 2016-05-20 DIAGNOSIS — Z87891 Personal history of nicotine dependence: Secondary | ICD-10-CM | POA: Insufficient documentation

## 2016-05-20 NOTE — ED Notes (Signed)
[facility] Expected date:  Expected time:  Means of arrival:  Comments: EMS 51 yo male from [facility]-weak with peripheral edema
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
## 2016-05-20 NOTE — ED Triage Notes (Signed)
Pt comes from [facility] via EMS with complaints of bilateral leg weakness stating he is unable to stand up per normal.

## 2016-05-21 NOTE — ED Notes (Signed)
Pt was ask to ambulated and pt  told doctor he couldn't walk or move his legs

## 2016-05-21 NOTE — Discharge Instructions (Signed)
You were seen today because you stated you were unable to walk. You have been seen multiple times for this previously. You were evaluated by social work and given multiple resources. You need follow-up with those resources.

## 2016-05-21 NOTE — Progress Notes (Signed)
LCSWA assisted with patient disposition. [facility] where patient  feels they cannot provide appropriate care for patient. LCSWA discussed with Emergency planning/management officerupervisor and Medical director at [facility]. -reports to send patient to [facility] [place]. [employer] is assisting to pick up patient walker.  Called [facility]- Spoke to Will about bed availability.  He report they have one cold weather bed, but insisted patient could stay in the lobby until bed is available.   LCSWA will provide [employer] Voucher approved by Supervisor from ED to [place].   Vivi BarrackNicole Shubh Chiara, Theresia MajorsLCSWA, MSW Clinical Social Worker 5E and Psychiatric Service Line [phone] 05/21/2016  12:00 PM

## 2016-05-22 ENCOUNTER — Ambulatory Visit (INDEPENDENT_AMBULATORY_CARE_PROVIDER_SITE_OTHER): Payer: No Typology Code available for payment source | Admitting: Family

## 2016-05-25 ENCOUNTER — Encounter (HOSPITAL_COMMUNITY): Payer: Self-pay | Admitting: Emergency Medicine

## 2016-05-25 ENCOUNTER — Emergency Department (HOSPITAL_COMMUNITY)
Admission: EM | Admit: 2016-05-25 | Discharge: 2016-05-25 | Disposition: A | Discharge status: Home / Self Care | Payer: Medicaid Other | Attending: Emergency Medicine | Admitting: Emergency Medicine

## 2016-05-25 DIAGNOSIS — Z7982 Long term (current) use of aspirin: Secondary | ICD-10-CM | POA: Diagnosis not present

## 2016-05-25 DIAGNOSIS — Z87891 Personal history of nicotine dependence: Secondary | ICD-10-CM | POA: Insufficient documentation

## 2016-05-25 DIAGNOSIS — Z5900 Homelessness unspecified: Secondary | ICD-10-CM

## 2016-05-25 DIAGNOSIS — R42 Dizziness and giddiness: Secondary | ICD-10-CM | POA: Diagnosis not present

## 2016-05-25 DIAGNOSIS — Z8673 Personal history of transient ischemic attack (TIA), and cerebral infarction without residual deficits: Secondary | ICD-10-CM | POA: Diagnosis not present

## 2016-05-25 DIAGNOSIS — I10 Essential (primary) hypertension: Secondary | ICD-10-CM | POA: Diagnosis not present

## 2016-05-25 NOTE — Discharge Instructions (Signed)
To find a primary care or specialty doctor please call [phone] or [phone] to access "[facility] Find a Doctor Service." ° °You may also go on the [facility] website at [email or web address] ° °There are also multiple [facility], [facility], [facility] and [facility] practices throughout [place] that are frequently accepting new patients. You may find a clinic that is close to your home and contact them. ° ° [facility] -  °[address] °[place] [address] °[phone] ° ° °[facility] -  °[address] °[place] [address] °[phone] ° ° °[facility] - °[address]  °[place]  [address] °[phone] ° ° °

## 2016-05-25 NOTE — ED Triage Notes (Signed)
Per [facility] because he felt his heart beating in his head. EMS reports the pt denies any chest pain or weakness. Vital signs 138/92, P-86, R-18, oxygen is 100%.  Pt states he feels his heart beating in his head. Pt denies chest pain or pain of any sort. Pt is sleeping while this RN is doing his triage assessment.
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
## 2016-05-25 NOTE — ED Notes (Signed)
Pt states he feels his heart beating in his head. Pt denies chest pain or pain of any sort. Pt is sleeping while this RN is doing his triage assessment.

## 2016-05-29 ENCOUNTER — Encounter (HOSPITAL_COMMUNITY): Payer: Self-pay | Admitting: Emergency Medicine

## 2016-05-29 ENCOUNTER — Encounter (HOSPITAL_COMMUNITY): Payer: Self-pay

## 2016-05-29 ENCOUNTER — Emergency Department (HOSPITAL_COMMUNITY)
Admission: EM | Admit: 2016-05-29 | Discharge: 2016-05-29 | Disposition: A | Discharge status: Home / Self Care | Payer: Medicaid Other | Attending: Emergency Medicine | Admitting: Emergency Medicine

## 2016-05-29 ENCOUNTER — Emergency Department (HOSPITAL_COMMUNITY)
Admission: EM | Admit: 2016-05-29 | Discharge: 2016-05-29 | Disposition: A | Discharge status: Home / Self Care | Payer: Medicaid Other | Source: Home / Self Care | Attending: Emergency Medicine | Admitting: Emergency Medicine

## 2016-05-29 DIAGNOSIS — Y929 Unspecified place or not applicable: Secondary | ICD-10-CM | POA: Insufficient documentation

## 2016-05-29 DIAGNOSIS — Y939 Activity, unspecified: Secondary | ICD-10-CM | POA: Diagnosis not present

## 2016-05-29 DIAGNOSIS — Z8673 Personal history of transient ischemic attack (TIA), and cerebral infarction without residual deficits: Secondary | ICD-10-CM | POA: Insufficient documentation

## 2016-05-29 DIAGNOSIS — Z79899 Other long term (current) drug therapy: Secondary | ICD-10-CM

## 2016-05-29 DIAGNOSIS — I890 Lymphedema, not elsewhere classified: Secondary | ICD-10-CM | POA: Insufficient documentation

## 2016-05-29 DIAGNOSIS — I10 Essential (primary) hypertension: Secondary | ICD-10-CM

## 2016-05-29 DIAGNOSIS — Z87891 Personal history of nicotine dependence: Secondary | ICD-10-CM | POA: Diagnosis not present

## 2016-05-29 DIAGNOSIS — Z5900 Homelessness unspecified: Secondary | ICD-10-CM

## 2016-05-29 DIAGNOSIS — W1839XA Other fall on same level, initial encounter: Secondary | ICD-10-CM | POA: Diagnosis not present

## 2016-05-29 DIAGNOSIS — Y999 Unspecified external cause status: Secondary | ICD-10-CM | POA: Diagnosis not present

## 2016-05-29 DIAGNOSIS — W19XXXA Unspecified fall, initial encounter: Secondary | ICD-10-CM

## 2016-05-29 DIAGNOSIS — Z7982 Long term (current) use of aspirin: Secondary | ICD-10-CM

## 2016-05-29 DIAGNOSIS — H9203 Otalgia, bilateral: Secondary | ICD-10-CM
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
## 2016-05-29 NOTE — ED Notes (Signed)
Consulting civil engineerCharge RN to work with social work for patient placement.

## 2016-05-29 NOTE — ED Notes (Signed)
Pt verbalized understanding of discharge instructions. Ambulatory at discharge.

## 2016-05-29 NOTE — ED Triage Notes (Signed)
BIB EMS for bilat lower extremity edema. Legs wrapped. Has sores.  Seen for same 2 days ago.  Hypertensive, hx of same (172/80), HR 80, resp unlabored at 16.  98% on room air.  Pt sleeping en route.

## 2016-05-29 NOTE — Discharge Instructions (Signed)
You may alternate Tylenol and Ibuprofen for pain.

## 2016-05-29 NOTE — ED Notes (Signed)
Attempted to call [facility] without success

## 2016-05-29 NOTE — ED Notes (Signed)
Attempted to call [facility] x2.  Will speak with charge RN
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
## 2016-05-29 NOTE — ED Notes (Signed)
Bed: WTR7 Expected date:  Expected time:  Means of arrival:  Comments: Fall, no injury
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
## 2016-05-30 ENCOUNTER — Encounter (HOSPITAL_COMMUNITY): Payer: Self-pay | Admitting: *Deleted

## 2016-05-30 ENCOUNTER — Emergency Department (HOSPITAL_COMMUNITY)
Admission: EM | Admit: 2016-05-30 | Discharge: 2016-05-30 | Disposition: A | Discharge status: Home / Self Care | Payer: Medicaid Other | Attending: Emergency Medicine | Admitting: Emergency Medicine

## 2016-05-30 DIAGNOSIS — R609 Edema, unspecified: Secondary | ICD-10-CM

## 2016-05-30 DIAGNOSIS — Z79899 Other long term (current) drug therapy: Secondary | ICD-10-CM | POA: Insufficient documentation

## 2016-05-30 DIAGNOSIS — R262 Difficulty in walking, not elsewhere classified: Secondary | ICD-10-CM | POA: Diagnosis not present

## 2016-05-30 DIAGNOSIS — R600 Localized edema: Secondary | ICD-10-CM | POA: Diagnosis not present

## 2016-05-30 DIAGNOSIS — Z7982 Long term (current) use of aspirin: Secondary | ICD-10-CM | POA: Diagnosis not present

## 2016-05-30 DIAGNOSIS — I10 Essential (primary) hypertension: Secondary | ICD-10-CM

## 2016-05-30 DIAGNOSIS — Z87891 Personal history of nicotine dependence: Secondary | ICD-10-CM | POA: Insufficient documentation

## 2016-05-30 DIAGNOSIS — Z8673 Personal history of transient ischemic attack (TIA), and cerebral infarction without residual deficits: Secondary | ICD-10-CM | POA: Diagnosis not present

## 2016-05-30 MED ORDER — CARVEDILOL 6.25 MG PO TABS: 6.2500 mg | ORAL_TABLET | Freq: Two times a day (BID) | ORAL | 2 refills | Status: DC

## 2016-05-30 MED ORDER — TORSEMIDE 20 MG PO TABS: 20.0000 mg | ORAL_TABLET | Freq: Two times a day (BID) | ORAL | 0 refills | Status: DC

## 2016-05-30 NOTE — ED Notes (Signed)
[facility].

## 2016-05-30 NOTE — ED Triage Notes (Signed)
[facility]IA [facility] FOR CONTINUED DIFFICULTY WALKING. PER EMS, THE PT IS REQUESTING SOCIAL SERVICES FOR FACILITY PLACEMENT. PER EMS, THIS IS HIS THIRD VISIT IN 24 HRS. DENIES ANY INJURY.
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
## 2016-05-30 NOTE — ED Notes (Addendum)
Pt was discharged from ED last night(1/17), spent the night in lobby, and only left a few hours ago after receiving a taxi voucher [facility].  [facility]    Per EMS, [facility], so he returns to the ED to sleep.

## 2016-05-30 NOTE — ED Notes (Signed)
PT DISCHARGED. INSTRUCTIONS AND PRESCRIPTIONS GIVEN. AAOX4. PT IN NO APPARENT DISTRESS OR PAIN. THE OPPORTUNITY TO ASK QUESTIONS WAS PROVIDED. 

## 2016-05-30 NOTE — Progress Notes (Signed)
[name] spoke to pt and ascertained pt needs and provided pt with a taxi voucher after verifying by phone [facility] is [facility] by speaking to Brayton Caveserence Hunt [facility] who verified this with Casimiro Need[facility]Michael [facility].  Pt was provided with the number to the social security administration in order for the pt to verify time and date of the pt's next hearing for disability at the [employer]. Pt reports [facility] will [facility] after an examination by the doctor in triage.Dorothe Pea.   Zineb Glade F. RiffeyFrancesco Sor, LCSWA, LCAS Clinical Social Worker Ph: [phone] 05/30/2016

## 2016-05-31 ENCOUNTER — Encounter (HOSPITAL_COMMUNITY): Payer: Self-pay | Admitting: Emergency Medicine

## 2016-05-31 ENCOUNTER — Emergency Department (HOSPITAL_COMMUNITY)
Admission: EM | Admit: 2016-05-31 | Discharge: 2016-05-31 | Disposition: A | Discharge status: Home / Self Care | Payer: Medicaid Other | Attending: Emergency Medicine | Admitting: Emergency Medicine

## 2016-05-31 DIAGNOSIS — Z87891 Personal history of nicotine dependence: Secondary | ICD-10-CM | POA: Insufficient documentation

## 2016-05-31 DIAGNOSIS — Z8673 Personal history of transient ischemic attack (TIA), and cerebral infarction without residual deficits: Secondary | ICD-10-CM | POA: Diagnosis not present

## 2016-05-31 DIAGNOSIS — Z7982 Long term (current) use of aspirin: Secondary | ICD-10-CM | POA: Diagnosis not present

## 2016-05-31 DIAGNOSIS — I10 Essential (primary) hypertension: Secondary | ICD-10-CM | POA: Diagnosis not present

## 2016-05-31 DIAGNOSIS — I890 Lymphedema, not elsewhere classified: Secondary | ICD-10-CM | POA: Diagnosis present

## 2016-05-31 NOTE — ED Notes (Signed)
PT cleaning self. Will dress PT after

## 2016-05-31 NOTE — ED Triage Notes (Signed)
Per EMS: Pt called EMS to be cleaned up.  No other medical issues.
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
## 2016-05-31 NOTE — Progress Notes (Signed)
[name] spoke with patient regarding bus pass/ taxi voucher. [name] informed patient we will not provide taxi voucher for 48 hours. Patient will need to use the bus system to get around. Patient stated he will contact DSS regarding Medicaid status.   Stacy GardnerErin Gage Weant, LCSWA Clinical Social Worker [phone]

## 2016-06-01 ENCOUNTER — Emergency Department (HOSPITAL_COMMUNITY)
Admission: EM | Admit: 2016-06-01 | Discharge: 2016-06-01 | Disposition: A | Discharge status: Home / Self Care | Payer: Medicaid Other | Attending: Emergency Medicine | Admitting: Emergency Medicine

## 2016-06-01 ENCOUNTER — Encounter (HOSPITAL_COMMUNITY): Payer: Self-pay

## 2016-06-01 DIAGNOSIS — Z7982 Long term (current) use of aspirin: Secondary | ICD-10-CM | POA: Insufficient documentation

## 2016-06-01 DIAGNOSIS — I890 Lymphedema, not elsewhere classified: Secondary | ICD-10-CM | POA: Diagnosis not present

## 2016-06-01 DIAGNOSIS — I10 Essential (primary) hypertension: Secondary | ICD-10-CM | POA: Insufficient documentation

## 2016-06-01 DIAGNOSIS — Z8673 Personal history of transient ischemic attack (TIA), and cerebral infarction without residual deficits: Secondary | ICD-10-CM | POA: Diagnosis not present

## 2016-06-01 DIAGNOSIS — Z590 Homelessness: Secondary | ICD-10-CM | POA: Diagnosis not present

## 2016-06-01 DIAGNOSIS — Z87891 Personal history of nicotine dependence: Secondary | ICD-10-CM | POA: Diagnosis not present

## 2016-06-01 DIAGNOSIS — R159 Full incontinence of feces: Secondary | ICD-10-CM | POA: Diagnosis present

## 2016-06-01 DIAGNOSIS — Z5901 Sheltered homelessness: Secondary | ICD-10-CM
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
## 2016-06-01 NOTE — ED Triage Notes (Signed)
Pt provided discharge instructions and encouraged to clean self at facility he is residing at, not to call EMS when he needs his butt washed due to the need for EMS to be available for emergencies.

## 2016-06-01 NOTE — ED Triage Notes (Addendum)
Pt BIB [facility] after being d/c'd from the ED earlier today. He is requesting to be cleaned up and to speak with a Child psychotherapistsocial worker again. A&Ox4. EMS offered to help pt to shower to clean himself off, but he refused.

## 2016-06-01 NOTE — ED Triage Notes (Signed)
Writer and AmesWilliam NT present during conversation.Pt was provided a basin of water and soap, instructed to clean self. Pt stated he can not stand, all items at beside within reach. Pt verbalizes that he will fall if he stands, instructed pt to only sit or remain in bed to clean self. He finds this funny as laughs and tells us he does not like to come to the hospital to be cleaned, although he contradicts his comment as he  Has been known to call EMS and request [facility]. Specifically [facility] Pt states he can not leave due to no walker and has to have a cab. States EMS will not bring walker with him. Due to this pt is provided with walker and bus pass to leave facility upon completing the task of washing the feces off his butt. Pt continues to create excuses to not clean himself. Firmly instructed to complete task in order to leave the facility. Pt laughing at staff. Options were offered by EMS as documented to help clean self but refused. Yesterday come in for same, to be cleaned only, no other complaints. At present pt has ambulated in room without assist of staff.

## 2016-06-04 ENCOUNTER — Emergency Department (HOSPITAL_COMMUNITY)
Admission: EM | Admit: 2016-06-04 | Discharge: 2016-06-04 | Disposition: A | Discharge status: Home / Self Care | Payer: Medicaid Other | Source: Home / Self Care | Attending: Emergency Medicine | Admitting: Emergency Medicine

## 2016-06-04 ENCOUNTER — Encounter: Payer: Self-pay | Admitting: Pediatric Intensive Care

## 2016-06-04 ENCOUNTER — Encounter (HOSPITAL_COMMUNITY): Payer: Self-pay

## 2016-06-04 ENCOUNTER — Encounter (HOSPITAL_COMMUNITY): Payer: Self-pay | Admitting: Emergency Medicine

## 2016-06-04 DIAGNOSIS — S00511A Abrasion of lip, initial encounter: Secondary | ICD-10-CM

## 2016-06-04 DIAGNOSIS — Y9289 Other specified places as the place of occurrence of the external cause: Secondary | ICD-10-CM | POA: Diagnosis not present

## 2016-06-04 DIAGNOSIS — Y999 Unspecified external cause status: Secondary | ICD-10-CM

## 2016-06-04 DIAGNOSIS — I10 Essential (primary) hypertension: Secondary | ICD-10-CM

## 2016-06-04 DIAGNOSIS — Y9389 Activity, other specified: Secondary | ICD-10-CM | POA: Insufficient documentation

## 2016-06-04 DIAGNOSIS — Z87891 Personal history of nicotine dependence: Secondary | ICD-10-CM | POA: Insufficient documentation

## 2016-06-04 DIAGNOSIS — W19XXXA Unspecified fall, initial encounter: Secondary | ICD-10-CM

## 2016-06-04 DIAGNOSIS — W010XXA Fall on same level from slipping, tripping and stumbling without subsequent striking against object, initial encounter: Secondary | ICD-10-CM | POA: Insufficient documentation

## 2016-06-04 DIAGNOSIS — Y929 Unspecified place or not applicable: Secondary | ICD-10-CM | POA: Insufficient documentation

## 2016-06-04 DIAGNOSIS — Z7982 Long term (current) use of aspirin: Secondary | ICD-10-CM | POA: Diagnosis not present

## 2016-06-04 DIAGNOSIS — Z79899 Other long term (current) drug therapy: Secondary | ICD-10-CM | POA: Insufficient documentation

## 2016-06-04 DIAGNOSIS — W1839XA Other fall on same level, initial encounter: Secondary | ICD-10-CM | POA: Diagnosis not present

## 2016-06-04 DIAGNOSIS — I890 Lymphedema, not elsewhere classified: Secondary | ICD-10-CM | POA: Diagnosis not present

## 2016-06-04 DIAGNOSIS — Z8673 Personal history of transient ischemic attack (TIA), and cerebral infarction without residual deficits: Secondary | ICD-10-CM

## 2016-06-04 DIAGNOSIS — Z590 Homelessness: Secondary | ICD-10-CM | POA: Insufficient documentation

## 2016-06-04 DIAGNOSIS — Y9301 Activity, walking, marching and hiking: Secondary | ICD-10-CM

## 2016-06-04 MED ORDER — BACITRACIN ZINC 500 UNIT/GM EX OINT: 1.0000 "application " | TOPICAL_OINTMENT | Freq: Two times a day (BID) | CUTANEOUS | 0 refills | Status: DC

## 2016-06-04 MED ORDER — TETANUS-DIPHTH-ACELL PERTUSSIS 5-2.5-18.5 LF-MCG/0.5 IM SUSP
0.5000 mL | Freq: Once | INTRAMUSCULAR | Status: AC
  Administered 2016-06-04: 0.5 mL via INTRAMUSCULAR
  Filled 2016-06-04: qty 0.5

## 2016-06-04 MED ORDER — BACITRACIN ZINC 500 UNIT/GM EX OINT
TOPICAL_OINTMENT | Freq: Once | CUTANEOUS | Status: AC
  Administered 2016-06-04: 1 via TOPICAL
  Filled 2016-06-04: qty 0.9
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
## 2016-06-04 NOTE — ED Triage Notes (Signed)
Per EMS, patient from [facility], reports he was walking with his walker and tripped. Denies dizziness, syncope, neck pain, back pain, head injury, LOC. Abrasion to upper lip.
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
## 2016-06-05 ENCOUNTER — Emergency Department (HOSPITAL_COMMUNITY)
Admission: EM | Admit: 2016-06-05 | Discharge: 2016-06-05 | Disposition: A | Discharge status: Home / Self Care | Payer: Medicaid Other | Attending: Emergency Medicine | Admitting: Emergency Medicine

## 2016-06-05 ENCOUNTER — Emergency Department (HOSPITAL_COMMUNITY): Payer: Medicaid Other

## 2016-06-05 DIAGNOSIS — S00511A Abrasion of lip, initial encounter: Secondary | ICD-10-CM

## 2016-06-05 DIAGNOSIS — W19XXXA Unspecified fall, initial encounter: Secondary | ICD-10-CM

## 2016-06-05 DIAGNOSIS — Z5900 Homelessness unspecified: Secondary | ICD-10-CM

## 2016-06-05 LAB — CBG MONITORING, ED: GLUCOSE-CAPILLARY: 91 mg/dL (ref 65–99)
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
## 2016-06-05 NOTE — ED Notes (Signed)
Pt refuses to have CT head completed states he can't be flat on bed.

## 2016-06-05 NOTE — ED Notes (Addendum)
Heart Healthy breakfast tray ordered 

## 2016-06-05 NOTE — Progress Notes (Signed)
[name] engaged with Patient at his bedside. [name] familiar with Patient from previous ED visits. Patient has been to the ED 22 times in the past 6 months. [name] provided Patient with brief supportive counseling and discussed discharge planning.   [name] discussed with Patient that [name] Department placed Patient at [facility] in November 2017. Patient had the opportunity to continue rehab post D/C at [facility] in [place], [place]. Patient refused. Patient continues to report that he will not leave [facility] as "all of my things are here". When [name] inquired about what "things" he has, Patient pointed to his belongings. [name] informed Patient that he could take his bag of belongings with him anywhere and noted that would not be a reason to decline treatment in another county. Patient reports a understanding. At this time, Patient does not have any insurance per records and is unable to pay privately for a facility as he has no income. Patient previously had Medicaid. [name] encouraged Patient to contact [facility] regarding Medicaid status. [name] also suggested that Patient get documentation of his Medicaid status in the event that other systems continue to show no insurance. Patient agreeable.   [name] provided Patient with the number to the social security administration in order for the pt to verify time and date of the pt's next hearing for disability at the [employer]. Pt reports he will return to [facility] at discharge. Patient encouraged to see Primary Care Physician ([facility] or [facility]) for non-emergent medical needs.   [name] provided Patient with taxi voucher to [facility]. [name] signing off at this time. Please consult should new need(s) arise.    Enos FlingAshley Susano Cleckler, MSW, LCSW [facility]/58M Clinical Social Worker [phone]

## 2016-06-05 NOTE — ED Notes (Signed)
Wheeled bed to restroom, pt 2 assist to restroom. Changed pt's paper scrubs out. Pt placed back in bed and ready to be discharged.

## 2016-06-05 NOTE — ED Notes (Signed)
Patient unable to lay down for CT.

## 2016-06-05 NOTE — ED Notes (Signed)
Patient transported to CT 

## 2016-06-06 ENCOUNTER — Telehealth: Payer: Self-pay | Admitting: Family Medicine

## 2016-06-06 NOTE — Telephone Encounter (Signed)
Karin GoldenLaquanna from [facility], is in desperate need to get pt seen. Pt is homeless. Pt can not stay there any longer because pt is constantly falling and they are having to call EMS just to get pt to the bathroom and back. Karin GoldenLaquanna needs us to fill out the [facility]FL2 form to get pt in assisted living ASAP. Appointment is for 06-07-16 @ 10:15am. Thank you!

## 2016-06-06 NOTE — Telephone Encounter (Signed)
Just an Joshua LawrenceFYI Jazma, new patient for you 1/26.

## 2016-06-07 ENCOUNTER — Encounter: Payer: Self-pay | Admitting: Pediatric Intensive Care

## 2016-06-07 ENCOUNTER — Ambulatory Visit (INDEPENDENT_AMBULATORY_CARE_PROVIDER_SITE_OTHER): Payer: Medicaid Other | Admitting: Obstetrics and Gynecology

## 2016-06-07 ENCOUNTER — Encounter: Payer: Self-pay | Admitting: Licensed Clinical Social Worker

## 2016-06-07 ENCOUNTER — Encounter: Payer: Self-pay | Admitting: Obstetrics and Gynecology

## 2016-06-07 VITALS — BP 152/98 | HR 85 | Temp 97.2°F | Ht 69.0 in | Wt 297.0 lb

## 2016-06-07 DIAGNOSIS — G458 Other transient cerebral ischemic attacks and related syndromes: Secondary | ICD-10-CM | POA: Diagnosis not present

## 2016-06-07 DIAGNOSIS — G47411 Narcolepsy with cataplexy: Secondary | ICD-10-CM | POA: Diagnosis not present

## 2016-06-07 DIAGNOSIS — I10 Essential (primary) hypertension: Secondary | ICD-10-CM

## 2016-06-07 DIAGNOSIS — Z5900 Homelessness unspecified: Secondary | ICD-10-CM

## 2016-06-07 DIAGNOSIS — I890 Lymphedema, not elsewhere classified: Secondary | ICD-10-CM

## 2016-06-07 DIAGNOSIS — G47419 Narcolepsy without cataplexy: Secondary | ICD-10-CM | POA: Insufficient documentation

## 2016-06-07 DIAGNOSIS — Z7689 Persons encountering health services in other specified circumstances: Secondary | ICD-10-CM | POA: Diagnosis not present

## 2016-06-07 DIAGNOSIS — Z9114 Patient's other noncompliance with medication regimen: Secondary | ICD-10-CM

## 2016-06-07 MED ORDER — CLONIDINE HCL 0.1 MG PO TABS
0.1000 mg | ORAL_TABLET | Freq: Once | ORAL | Status: AC
  Administered 2016-06-07: 0.1 mg via ORAL

## 2016-06-07 NOTE — Telephone Encounter (Signed)
Pt scheduled for 1/31 
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
## 2016-06-07 NOTE — Telephone Encounter (Signed)
Do not see him on my schedule for today but will keep a look out. Thanks for FiservFYI

## 2016-06-07 NOTE — Patient Instructions (Addendum)
Will fill out FL2 form  Follow-up in 2 weeks to go over medical conditions

## 2016-06-07 NOTE — Progress Notes (Signed)
Social Work consult from Dr. Doroteo GlassmanPhelps to assist homeless patient with placement. [facility] is [facility].  Per patient, he now has Medicaid and wants to go to a family care home.  States the staff at [facility] will assist him.  PCP completed FL2 at patient's request for placement.  LCSW provided patient with a packet, which included FL2, ALF list and information about placement.  LCSW also provided patient with food, incontinent supplies, and a taxi voucher back to the [facility].   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  Also spoke with Randolm [employer], Social Work Tax inspectorntern, about packet patient will bring to [facility], quested that he assist with obtaining verification of patient's Medicaid and to call LCSW for assistance with the placement as needed. Patient[facility] with [facility]. Doroteo GlassmanPhelps.    Plan:  1. LCSW will follow up with [facility] prior to patient's next appointment on progress of placement.   2. Once Medicaid is verified, LCSW will discuss with patient care management services via [facility] due to frequent ED visit and housing issues.   Joshua Hineseborah Abhiram Criado, LCSW Licensed Clinical Social Worker [facility]   [phone] 2:55 PM

## 2016-06-09 ENCOUNTER — Emergency Department (HOSPITAL_COMMUNITY)
Admission: EM | Admit: 2016-06-09 | Discharge: 2016-06-09 | Disposition: A | Discharge status: Home / Self Care | Payer: Medicaid Other | Attending: Emergency Medicine | Admitting: Emergency Medicine

## 2016-06-09 ENCOUNTER — Encounter (HOSPITAL_COMMUNITY): Payer: Self-pay

## 2016-06-09 DIAGNOSIS — S81812A Laceration without foreign body, left lower leg, initial encounter: Secondary | ICD-10-CM

## 2016-06-09 DIAGNOSIS — Z79899 Other long term (current) drug therapy: Secondary | ICD-10-CM | POA: Insufficient documentation

## 2016-06-09 DIAGNOSIS — Z8673 Personal history of transient ischemic attack (TIA), and cerebral infarction without residual deficits: Secondary | ICD-10-CM | POA: Insufficient documentation

## 2016-06-09 DIAGNOSIS — Z7982 Long term (current) use of aspirin: Secondary | ICD-10-CM | POA: Insufficient documentation

## 2016-06-09 DIAGNOSIS — L0889 Other specified local infections of the skin and subcutaneous tissue: Secondary | ICD-10-CM | POA: Insufficient documentation

## 2016-06-09 DIAGNOSIS — M79605 Pain in left leg: Secondary | ICD-10-CM | POA: Diagnosis present

## 2016-06-09 DIAGNOSIS — I10 Essential (primary) hypertension: Secondary | ICD-10-CM | POA: Insufficient documentation

## 2016-06-09 DIAGNOSIS — F17210 Nicotine dependence, cigarettes, uncomplicated: Secondary | ICD-10-CM | POA: Diagnosis not present

## 2016-06-09 MED ORDER — ACETAMINOPHEN 325 MG PO TABS
650.0000 mg | ORAL_TABLET | Freq: Once | ORAL | Status: AC
  Administered 2016-06-09: 650 mg via ORAL
  Filled 2016-06-09: qty 2

## 2016-06-09 NOTE — Progress Notes (Signed)
[name] provided pt with taxi voucher to [facility].

## 2016-06-09 NOTE — ED Notes (Signed)
This RN with E. Meeks, EMT, changed pt's pants and cleaned perineal area.  Pt was soiled with feces, dried and fresher.  Pt's pants were soaked with urine.  While removing pants wound dressings on BLE became soiled with feces.  Dressings appear to be soiled with wound drainage and/or urine.

## 2016-06-09 NOTE — ED Notes (Signed)
Cab called. Pt wheeled to waiting room to wait for cab. Pt unable to sign esignature but states he is agreeable to discharge and understands his instructions.

## 2016-06-09 NOTE — Discharge Instructions (Signed)
Please follow with your primary care doctor in the next 2 days for a check-up. They must obtain records for further management.  ° °Do not hesitate to return to the Emergency Department for any new, worsening or concerning symptoms.  ° °

## 2016-06-09 NOTE — ED Notes (Signed)
Called [name] for cab voucher for pt. [name] will bring voucher

## 2016-06-09 NOTE — ED Triage Notes (Signed)
Pt comes via [facility], c/o of wounds on his buttocks, refused to be transported to [facility]. Pt has been seen wounds several times and has not followed up.
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
## 2016-06-09 NOTE — ED Notes (Signed)
Placed pants on pt and adult diaper

## 2016-06-09 NOTE — ED Notes (Signed)
Gave pt Mal[place]key sandwich, crackers and sprite

## 2016-06-10 ENCOUNTER — Encounter: Payer: Self-pay | Admitting: Obstetrics and Gynecology

## 2016-06-10 NOTE — Assessment & Plan Note (Addendum)
Currently not taking any medications due to current situation. Does not fill comfortable carrying medications on him. Will wait until he is in a more stable environment and add back medications as necessary. No emergent medication needs at this time. Follow up 1-2 weeks

## 2016-06-10 NOTE — Assessment & Plan Note (Signed)
H/o narcolespy. Unable to find any sleep studies in chart. Will likely need repeat sleep study not only for narcolepsy but for OSA. Patient has frequent fall spells; 2-3x a week. With history of narcolepsy concern for cataplexy. Will need further work-up and management. To follow-up soon.

## 2016-06-10 NOTE — Assessment & Plan Note (Signed)
Chronic and not well managed. Patient unable to follow with any lymphedema clinics or PCP office. Now establishing care and can get unna boots changed through us. Will plan on referring him to lymphedema clinic for further management at next office visit. Changed unna boots today in clinic. No signs of infection.

## 2016-06-10 NOTE — Assessment & Plan Note (Signed)
Elevated pressures in clinic. Patient has not been on medications for that last several months. Single dose of clonidine given today with decrease in pressures. Patient unable to get medications at this time due to homelessness. FL2 forms completed. Hopefully will get placement soon so he can be restarted on medications and be compliant. Follow-up in 1-2 weeks.

## 2016-06-10 NOTE — Assessment & Plan Note (Signed)
Diagnoses uncertain from chart review. Had CVA in problem list but do not believe this is validated. Reviewed all head imaging from past several years and no ischemic changes consistent with CVA. ?TIA vs functional right sided weakness in patient. Will continue to follow-up and monitor. Should be on aspirin.

## 2016-06-10 NOTE — Assessment & Plan Note (Signed)
Has been homeless for 8 years. Currently living in shelter. Will fill out FL2 to [place patient in facility to help him with ADLs. Medicaid is active. Our [name] will follow case to make sure patient is placed.

## 2016-06-11 ENCOUNTER — Encounter (HOSPITAL_COMMUNITY): Payer: Self-pay | Admitting: Emergency Medicine

## 2016-06-11 ENCOUNTER — Encounter: Payer: Self-pay | Admitting: Licensed Clinical Social Worker

## 2016-06-11 ENCOUNTER — Emergency Department (HOSPITAL_COMMUNITY)
Admission: EM | Admit: 2016-06-11 | Discharge: 2016-06-11 | Disposition: A | Discharge status: Home / Self Care | Payer: Medicaid Other | Source: Home / Self Care | Attending: Emergency Medicine | Admitting: Emergency Medicine

## 2016-06-11 ENCOUNTER — Emergency Department (HOSPITAL_COMMUNITY)
Admission: EM | Admit: 2016-06-11 | Discharge: 2016-06-12 | Disposition: A | Discharge status: Home / Self Care | Payer: Medicaid Other | Attending: Emergency Medicine | Admitting: Emergency Medicine

## 2016-06-11 DIAGNOSIS — I10 Essential (primary) hypertension: Secondary | ICD-10-CM | POA: Insufficient documentation

## 2016-06-11 DIAGNOSIS — W19XXXA Unspecified fall, initial encounter: Secondary | ICD-10-CM

## 2016-06-11 DIAGNOSIS — Z7982 Long term (current) use of aspirin: Secondary | ICD-10-CM | POA: Insufficient documentation

## 2016-06-11 DIAGNOSIS — Z5900 Homelessness unspecified: Secondary | ICD-10-CM

## 2016-06-11 DIAGNOSIS — Z79899 Other long term (current) drug therapy: Secondary | ICD-10-CM

## 2016-06-11 DIAGNOSIS — Y92009 Unspecified place in unspecified non-institutional (private) residence as the place of occurrence of the external cause: Secondary | ICD-10-CM | POA: Insufficient documentation

## 2016-06-11 DIAGNOSIS — R32 Unspecified urinary incontinence: Secondary | ICD-10-CM | POA: Diagnosis present

## 2016-06-11 DIAGNOSIS — Z8673 Personal history of transient ischemic attack (TIA), and cerebral infarction without residual deficits: Secondary | ICD-10-CM | POA: Insufficient documentation

## 2016-06-11 DIAGNOSIS — Y999 Unspecified external cause status: Secondary | ICD-10-CM

## 2016-06-11 DIAGNOSIS — F17210 Nicotine dependence, cigarettes, uncomplicated: Secondary | ICD-10-CM

## 2016-06-11 DIAGNOSIS — R531 Weakness: Secondary | ICD-10-CM

## 2016-06-11 DIAGNOSIS — Y939 Activity, unspecified: Secondary | ICD-10-CM | POA: Insufficient documentation
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
## 2016-06-11 NOTE — Discharge Instructions (Signed)
The social worker at the family practice clinic is already trying to find a new living facility for you. Follow up with them for further information
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
## 2016-06-11 NOTE — Progress Notes (Addendum)
Patient returned to ED several times since PCP office visit on 06/07/16.  Per ED social worker Morrie Sheldonshley, patient's Medicaid is showing up as active.     LCSW called [facility] spoke with Tresa EndoKelly, they have an open bed, however unable to move forward until patient's disability is approved.  Patient is open to receiving care management services via [facility] due to frequent ED visits, housing issues and managing chronic medical conditions.  Caryl Pinaalled Dorothy, care manager with [facility] patient has received services with them in the past.  She will called LCSW back if patient is eligible for services.  LCSW spoke with patient via phone while he was in the ED.  Disability check not approved, however has a disability court hearing in March, unsure of the date "it is written down somewhere".   LCSW called [facility], spoke to [place], Social work Tax inspectorintern [phone]. 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  Discussed possible options that would assist [facility] with managing patient's needs and prevent further ED visits due to falls. Per Rhunette CroftLaquana [facility] is unable to assist patient with bathing and dressing.  If he had assist it would limited his falls.   [facility].   Unable to move forward with facility placement until patient is approved for disability Plan: 1. LCSW will discuss [facility](PC[facility]) services with PCP since Medicaid is approved. 2. LCSW will continue to work with [facility], Social Work Intern for coordination of care.   Sammuel Hineseborah Apryl Brymer, LCSW Licensed Clinical Social Worker [facility]   [phone] 4:22 PM

## 2016-06-11 NOTE — ED Triage Notes (Signed)
Pt from homeless shelter via [facility]. Pt sts he has been unable to control his bowels and urination for a while now. He sts he is unable to get around on his own and take care of himself. He would like to get placement into medical housing. Pt denies any other s/s & any pain @ this time.

## 2016-06-11 NOTE — ED Triage Notes (Signed)
Per [facility], pt fell at home with no new injuries, just wants to be placed in a home. Pt in NAD

## 2016-06-11 NOTE — Discharge Instructions (Signed)
Follow up with your primary care provider. Return to the ER for new or worsening symptoms.  

## 2016-06-11 NOTE — ED Notes (Signed)
See EDP notes for assessment 

## 2016-06-12 ENCOUNTER — Encounter: Payer: Self-pay | Admitting: Obstetrics and Gynecology

## 2016-06-12 ENCOUNTER — Ambulatory Visit: Payer: No Typology Code available for payment source | Admitting: Obstetrics and Gynecology

## 2016-06-12 DIAGNOSIS — R531 Weakness: Secondary | ICD-10-CM | POA: Insufficient documentation

## 2016-06-12 DIAGNOSIS — R296 Repeated falls: Secondary | ICD-10-CM

## 2016-06-12 HISTORY — DX: Repeated falls: R29.6

## 2016-06-12 HISTORY — DX: Weakness: R53.1

## 2016-06-12 NOTE — ED Notes (Signed)
Had to get pt extensively cleaned due to having fecal matter stuck on his bottom. Pt sts he has been coming up here for "placement" and "the system is broken." Pt was showing NAD. RR even and unlabored upon d/c. Was placed in waiting room and given a meal & PO fluids.

## 2016-06-13 ENCOUNTER — Encounter (HOSPITAL_COMMUNITY): Payer: Self-pay

## 2016-06-13 ENCOUNTER — Emergency Department (HOSPITAL_COMMUNITY)
Admission: EM | Admit: 2016-06-13 | Discharge: 2016-06-13 | Disposition: A | Discharge status: Home / Self Care | Payer: Medicaid Other | Attending: Emergency Medicine | Admitting: Emergency Medicine

## 2016-06-13 DIAGNOSIS — Z7982 Long term (current) use of aspirin: Secondary | ICD-10-CM | POA: Insufficient documentation

## 2016-06-13 DIAGNOSIS — I10 Essential (primary) hypertension: Secondary | ICD-10-CM | POA: Insufficient documentation

## 2016-06-13 DIAGNOSIS — Z79899 Other long term (current) drug therapy: Secondary | ICD-10-CM | POA: Diagnosis not present

## 2016-06-13 DIAGNOSIS — R159 Full incontinence of feces: Secondary | ICD-10-CM | POA: Diagnosis not present

## 2016-06-13 DIAGNOSIS — Z8673 Personal history of transient ischemic attack (TIA), and cerebral infarction without residual deficits: Secondary | ICD-10-CM | POA: Insufficient documentation

## 2016-06-13 DIAGNOSIS — F17210 Nicotine dependence, cigarettes, uncomplicated: Secondary | ICD-10-CM | POA: Insufficient documentation

## 2016-06-13 NOTE — ED Triage Notes (Signed)
[facility] after being incontinent and weak. Pt is requesting to be cleaned up and wants to be admitted or placed in a facility. PT states he's unable to care for himself

## 2016-06-13 NOTE — ED Notes (Signed)
Pt taken to shower on arrival to ED to clean off incontinent urine and stool
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
## 2016-06-19 ENCOUNTER — Ambulatory Visit: Payer: Medicaid Other | Admitting: Obstetrics and Gynecology

## 2016-06-19 ENCOUNTER — Encounter: Payer: Self-pay | Admitting: Licensed Clinical Social Worker

## 2016-06-24 ENCOUNTER — Ambulatory Visit: Payer: No Typology Code available for payment source | Admitting: Family Medicine

## 2016-06-24 ENCOUNTER — Encounter (HOSPITAL_COMMUNITY): Payer: Self-pay

## 2016-06-24 ENCOUNTER — Emergency Department (HOSPITAL_COMMUNITY)
Admission: EM | Admit: 2016-06-24 | Discharge: 2016-06-24 | Disposition: A | Discharge status: Home / Self Care | Payer: Medicaid Other | Attending: Emergency Medicine | Admitting: Emergency Medicine

## 2016-06-24 DIAGNOSIS — R238 Other skin changes: Secondary | ICD-10-CM

## 2016-06-24 DIAGNOSIS — F17210 Nicotine dependence, cigarettes, uncomplicated: Secondary | ICD-10-CM | POA: Diagnosis not present

## 2016-06-24 DIAGNOSIS — R600 Localized edema: Secondary | ICD-10-CM

## 2016-06-24 DIAGNOSIS — I890 Lymphedema, not elsewhere classified: Secondary | ICD-10-CM | POA: Diagnosis not present

## 2016-06-24 DIAGNOSIS — Z79899 Other long term (current) drug therapy: Secondary | ICD-10-CM | POA: Diagnosis not present

## 2016-06-24 DIAGNOSIS — L909 Atrophic disorder of skin, unspecified: Secondary | ICD-10-CM

## 2016-06-24 DIAGNOSIS — Z8673 Personal history of transient ischemic attack (TIA), and cerebral infarction without residual deficits: Secondary | ICD-10-CM | POA: Insufficient documentation

## 2016-06-24 DIAGNOSIS — Z7982 Long term (current) use of aspirin: Secondary | ICD-10-CM | POA: Diagnosis not present

## 2016-06-24 DIAGNOSIS — I10 Essential (primary) hypertension: Secondary | ICD-10-CM | POA: Insufficient documentation

## 2016-06-24 LAB — CBC
HCT: 34.3 % — ABNORMAL LOW (ref 39.0–52.0)
HEMOGLOBIN: 11.3 g/dL — AB (ref 13.0–17.0)
MCH: 28.6 pg (ref 26.0–34.0)
MCHC: 32.9 g/dL (ref 30.0–36.0)
MCV: 86.8 fL (ref 78.0–100.0)
Platelets: 281 10*3/uL (ref 150–400)
RBC: 3.95 MIL/uL — AB (ref 4.22–5.81)
RDW: 17 % — ABNORMAL HIGH (ref 11.5–15.5)
WBC: 7 10*3/uL (ref 4.0–10.5)

## 2016-06-24 LAB — BASIC METABOLIC PANEL
Anion gap: 6 (ref 5–15)
BUN: 12 mg/dL (ref 6–20)
CHLORIDE: 106 mmol/L (ref 101–111)
CO2: 30 mmol/L (ref 22–32)
Calcium: 8.7 mg/dL — ABNORMAL LOW (ref 8.9–10.3)
Creatinine, Ser: 0.79 mg/dL (ref 0.61–1.24)
GFR calc non Af Amer: >60 mL/min (ref >60)
Glucose, Bld: 84 mg/dL (ref 65–99)
POTASSIUM: 3.9 mmol/L (ref 3.5–5.1)
SODIUM: 142 mmol/L (ref 135–145)

## 2016-06-24 MED ORDER — BACITRACIN ZINC 500 UNIT/GM EX OINT
TOPICAL_OINTMENT | Freq: Two times a day (BID) | CUTANEOUS | Status: DC
  Administered 2016-06-24: 10:00:00 via TOPICAL
  Filled 2016-06-24: qty 0.9
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
## 2016-06-24 NOTE — Progress Notes (Addendum)
[name] sent out initial referrals and FL-2 to [facility] SN[facility], as well as the [facility] in [place].  [name] also sent out intitial referral and FL-2 to [facility].  [name] received attorney information from Casimiro NeedleMichael, Interior and spatial designerdirector at [facility] in order to demonstrate to McKessonammy, Tour manageradmitting manager at [facility] and [facility], as welll as other SNF's that an attorney is working on the [employer]'s behalf in order for the pt to eventually receive disability payments.  Pt has a disability hearing on March 21st, 2018. Pt now has Medicaid.  [name] has spoken with [name] director regarding possible LOG for the pt while disability is pending.  [name] will speak to the pt to ensure pt will be willing to "sign over" pt's check if and when pt receives disability, in order to remain at a SNF if placed.  [name] will continue to follow.  1:51 PM [name] spoke to pt and asked him if he would be willing to "give up" his check should he be granted disability at some point after his March 21st disability hearing and pt stated, "I guess I have no choice".  Pt repeated this answer several time when asked by the [name],regarding his willingness to remain in a SNF, if placed by the [name], once his disability was granted.  [name] updated [name] Interior and spatial designerdirector.  4:06 PM [name] spoke to [place]my of [facility] who stated [facility].  Tammy stated [facility] needs AVS stating what wound care has been treated and what wound care f/u will be needed, as well as signed FL-2.  [name] spoke to EDP who is completing FL-2/AVS with needed wound care info.  [name] will send these via hub/fax to [facility]. [name] will notify [facility] when complete and facilitate transfer with pt's RN.  4:55 PM [name] set via fax pt's AVS and pt's updated FL-2 via the hub.  [name] spoke to pt's RN and advised pt is ready for D/C and [name] called [facility].  Pt has F/U appt for wound care on 07/11/16 at 12pm.  Change noted on AVS.   Dorothe PeaJonathan F. Rajendra Spiller, Theresia MajorsLCSWA, LCAS Clinical Social  Worker Ph: [phone]

## 2016-06-24 NOTE — Clinical Social Work Note (Signed)
Clinical Social Work Assessment  Patient Details  Name: [name] MRN: [id number] Date of Birth: 1965-12-07  Date of referral:  06/24/16               Reason for consult:  Facility Placement, Housing Concerns/Homelessness, [employer], Discharge Planning                Permission sought to share information with:  Sports coach, Oceanographer granted to share information::     Name::        Agency::  SNF referrals  Relationship::     Contact Information:     Housing/Transportation Living arrangements for the past 2 months:  Homeless Shelter ([facility]) Source of Information:  Patient, Development worker, community, Sports coach, Other (Comment Required) Tommi Emery: Director of [employer]) Patient Interpreter Needed:  None Criminal Activity/Legal Involvement Pertinent to Current Situation/Hospitalization:  No - Comment as needed Significant Relationships:  [employer] Lives with:  Self Do you feel safe going back to the place where you live?  No (needs more care than shelter can provide) Need for family participation in patient care:  No (Coment)  Care giving concerns:  LCSW received consult for homelessness.  Patient is from the shelter:  [facility] and has been living in shelter since August. Patient has digressed in care for himself with limited mobility (does have a walker), incontinence, and wound care.  Discussed care with Director at [facility]:  Tommi Emery regarding additional needs, behaviors, and plans. Casimiro Needle reports patient has approved medicaid as awaiting disability hearing on march 21st. Reports he is unable to return to shelter due to ADLs and care.  Reports patient is agreeable to plan for SNF and understands he needs a higher level of care.   Social Worker assessment / plan:  LCSW completed assessment and information needed for referral for SNF. Discussed with Director at [employer] patient needs and  placement options. Discussed with liaison with local SNF to review patient regarding possible LTC. Tammy reports she will be back in touch after she speaks with him and her buildings about the referral.  Plan:  LCSW will begin work up for SNF.  Will also discuss with leadership about potential LOG due to disability pending.  Patient is also active with the [facility] and Harriett Sine is involved in care for patient and also in agreement with placement.  Will send referrals out to surrounding area for potiental placement.  Employment status:  Disabled (Comment on whether or not currently receiving Disability) (pending disability hearing March 21st) Insurance information:  Medicaid [place] PT Recommendations:  Not assessed at this time (needs long term placement) Information / Referral to community resources:  Shelter, Skilled Nursing Facility  Patient/Family's Response to care:  At this time, patient is agreeable to placement.  Patient is known to change his mind with placement, but at this time he has no other options.  Patient/Family's Understanding of and Emotional Response to Diagnosis, Current Treatment, and Prognosis:  Patient aware of his current medical needs, however has been reluctant or having barriers related to financial issues.   Emotional Assessment Appearance:  Appears older than stated age Attitude/Demeanor/Rapport:    Affect (typically observed):  Accepting, Pleasant Orientation:  Oriented to Self, Oriented to Place, Oriented to  Time, Oriented to Situation Alcohol / Substance use:  Not Applicable Psych involvement (Current and /or in the community):  No (Comment)  Discharge Needs  Concerns to be addressed:  Discharge Planning Concerns, Homelessness,  Care Coordination Readmission within the last 30 days:  Yes Current discharge risk:  Homeless (unable ot return to shelter due to mobility and care needs.) Barriers to Discharge:  Inadequate or no insurance, Homeless with medical  needs   Cordella RegisterCoble, Jamar Weatherall N, LCSW 06/24/2016, 10:48 AM

## 2016-06-24 NOTE — Discharge Instructions (Addendum)
It was our pleasure to provide your ER care today - we hope that you feel better.  EMS will transport you to your new extended care facility.   Follow up with your primary care doctor in the next few days for recheck - call office today to arrange appointment.   Have your primary care doctor help coordinate your follow up with the wound care and/or lymphedema clinic.   Also follow up at the wound care clinic in the next 1-2 weeks as scheduled.   Keep skin very clean and dry.   Make sure if you have bowel movement or urinate, to clean area immediately, and to keep the area very clean and dry.  You may apply desitin or A&D ointment to area as needed.   Return to ER if worse, new symptoms, fevers, trouble breathing, other concern.

## 2016-06-24 NOTE — ED Triage Notes (Signed)
[facility] bleeding skin lesions on bottom.  Per [facility] patient had no bleeding from lesions when they saw his bottom.  Patient requesting skilled placement.  Per Tommi EmeryMichael Pearson Director at [facility], call him if patient refuses services we offer and let him know for he will kick patient out of [facility].  Patient has a FL2 on file at [facility] per Interior and spatial designerdirector to [facility] and he is waiting to be seen so can help with placement.

## 2016-06-24 NOTE — NC FL2 (Addendum)
  [place] MEDICAID FL2 LEVEL OF CARE SCREENING TOOL     IDENTIFICATION  Patient Name: [name] Birthdate: 09/20/1965 Sex: male Admission Date (Current Location): 06/24/2016  [place]:  [place] [id number] L Facility and Address:  [facility],  [address], [place] [id number]      Provider Number: [id number]  Attending Physician Name and Address:  Cathren LaineKevin Steinl, MD  Relative Name and Phone Number:       Current Level of Care: Hospital Recommended Level of Care: Skilled Nursing Facility Prior Approval Number:    Date Approved/Denied:   PASRR Number: [id number]  Discharge Plan: SNF    Current Diagnoses: Patient Active Problem List   Diagnosis Date Noted  . Right sided weakness 06/12/2016  . Frequent falls 06/12/2016  . Narcolepsy 06/07/2016  . Morbid obesity (HCC) 03/09/2016  . Noncompliance with medication regimen 08/21/2015  . Homelessness 04/24/2015  . Frozen shoulder 04/24/2015  . TIA (transient ischemic attack) 04/05/2014  . Chronic acquired lymphedema 08/08/2012  . Obstructive sleep apnea 06/08/2009  . Dyslipidemia 01/17/2009  . GERD 12/08/2008  . ERECTILE DYSFUNCTION, ORGANIC 12/08/2008  . Essential hypertension 07/16/2007    Orientation RESPIRATION BLADDER Height & Weight     Time, Place, Situation, Self  Normal Continent Weight:   Height:     BEHAVIORAL SYMPTOMS/MOOD NEUROLOGICAL BOWEL NUTRITION STATUS      Continent  (Regular)  AMBULATORY STATUS COMMUNICATION OF NEEDS Skin   Extensive Assist Verbally  (Skin Lesions)                       Personal Care Assistance Level of Assistance  Bathing, Dressing Bathing Assistance: Maximum assistance Feeding assistance: Independent Dressing Assistance: Maximum assistance     Functional Limitations Info    Sight Info: Adequate Hearing Info: Adequate Speech Info: Adequate    SPECIAL CARE FACTORS FREQUENCY  PT (By licensed PT), OT (By licensed OT)      PT Frequency: 5 OT Frequency: 5            Contractures      Additional Factors Info  Code Status, Allergies Code Status Info: Prior Allergies Info: No Known Allergies            Current Medications (06/24/2016):  This is the current hospital active medication list Current Facility-Administered Medications  Medication Dose Route Frequency Provider Last Rate Last Dose  . bacitracin ointment   Topical BID Cathren LaineKevin Steinl, MD       Current Outpatient Prescriptions  Medication Sig Dispense Refill  . carvedilol (COREG) 6.25 MG tablet Take 1 tablet (6.25 mg total) by mouth 2 (two) times daily with a meal. 60 tablet 2  . torsemide (DEMADEX) 20 MG tablet Take 1 tablet (20 mg total) by mouth 2 (two) times daily. 60 tablet 0  . aspirin 325 MG tablet Take 1 tablet (325 mg total) by mouth daily. (Patient not taking: Reported on 05/15/2016) 30 tablet 2  . bacitracin ointment Apply 1 application topically 2 (two) times daily. (Patient not taking: Reported on 06/10/2016) 120 g 0     Discharge Medications: Please see After Visit Summary for a current medication list  Relevant Imaging Results:  Relevant Lab Results:   Additional Information [id number]  Dorothe PeaJonathan F Janiylah Hannis, LCSWA

## 2016-06-24 NOTE — Progress Notes (Addendum)
ED CM [facility]. Appointment scheduled for 3-1 at 12n.Johnattan [name] given this information to provide to the SNF. No further ED CM needs identified.

## 2016-06-25 ENCOUNTER — Non-Acute Institutional Stay (SKILLED_NURSING_FACILITY): Payer: Medicaid Other | Admitting: Internal Medicine

## 2016-06-25 ENCOUNTER — Encounter: Payer: Self-pay | Admitting: Pediatric Intensive Care

## 2016-06-25 ENCOUNTER — Encounter: Payer: Self-pay | Admitting: Internal Medicine

## 2016-06-25 DIAGNOSIS — M7501 Adhesive capsulitis of right shoulder: Secondary | ICD-10-CM

## 2016-06-25 DIAGNOSIS — Z720 Tobacco use: Secondary | ICD-10-CM | POA: Diagnosis not present

## 2016-06-25 DIAGNOSIS — R5381 Other malaise: Secondary | ICD-10-CM | POA: Diagnosis not present

## 2016-06-25 DIAGNOSIS — D649 Anemia, unspecified: Secondary | ICD-10-CM

## 2016-06-25 DIAGNOSIS — Z9114 Patient's other noncompliance with medication regimen: Secondary | ICD-10-CM | POA: Diagnosis not present

## 2016-06-25 DIAGNOSIS — Z8673 Personal history of transient ischemic attack (TIA), and cerebral infarction without residual deficits: Secondary | ICD-10-CM

## 2016-06-25 DIAGNOSIS — I10 Essential (primary) hypertension: Secondary | ICD-10-CM | POA: Diagnosis not present

## 2016-06-25 DIAGNOSIS — I890 Lymphedema, not elsewhere classified: Secondary | ICD-10-CM
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
## 2016-07-11 ENCOUNTER — Encounter (HOSPITAL_BASED_OUTPATIENT_CLINIC_OR_DEPARTMENT_OTHER): Payer: Medicaid Other

## 2016-07-25 ENCOUNTER — Encounter: Payer: Self-pay | Admitting: Adult Health

## 2016-07-25 ENCOUNTER — Non-Acute Institutional Stay (SKILLED_NURSING_FACILITY): Payer: Medicaid Other | Admitting: Adult Health

## 2016-07-25 DIAGNOSIS — G458 Other transient cerebral ischemic attacks and related syndromes: Secondary | ICD-10-CM | POA: Diagnosis not present

## 2016-07-25 DIAGNOSIS — I890 Lymphedema, not elsewhere classified: Secondary | ICD-10-CM | POA: Diagnosis not present

## 2016-07-25 DIAGNOSIS — I10 Essential (primary) hypertension: Secondary | ICD-10-CM
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
## 2016-08-22 ENCOUNTER — Encounter: Payer: Self-pay | Admitting: Adult Health

## 2016-08-22 ENCOUNTER — Non-Acute Institutional Stay (SKILLED_NURSING_FACILITY): Payer: Medicaid Other | Admitting: Adult Health

## 2016-08-22 DIAGNOSIS — I10 Essential (primary) hypertension: Secondary | ICD-10-CM

## 2016-08-22 DIAGNOSIS — G458 Other transient cerebral ischemic attacks and related syndromes: Secondary | ICD-10-CM

## 2016-08-22 DIAGNOSIS — I890 Lymphedema, not elsewhere classified: Secondary | ICD-10-CM
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
## 2016-09-16 ENCOUNTER — Emergency Department (HOSPITAL_COMMUNITY)
Admission: EM | Admit: 2016-09-16 | Discharge: 2016-09-17 | Disposition: A | Discharge status: Home / Self Care | Payer: Medicaid Other | Attending: Emergency Medicine | Admitting: Emergency Medicine

## 2016-09-16 ENCOUNTER — Encounter (HOSPITAL_COMMUNITY): Payer: Self-pay | Admitting: Emergency Medicine

## 2016-09-16 ENCOUNTER — Emergency Department (HOSPITAL_COMMUNITY): Payer: Medicaid Other

## 2016-09-16 DIAGNOSIS — R600 Localized edema: Secondary | ICD-10-CM

## 2016-09-16 DIAGNOSIS — Z8673 Personal history of transient ischemic attack (TIA), and cerebral infarction without residual deficits: Secondary | ICD-10-CM | POA: Insufficient documentation

## 2016-09-16 DIAGNOSIS — Z7982 Long term (current) use of aspirin: Secondary | ICD-10-CM | POA: Diagnosis not present

## 2016-09-16 DIAGNOSIS — F17210 Nicotine dependence, cigarettes, uncomplicated: Secondary | ICD-10-CM | POA: Insufficient documentation

## 2016-09-16 DIAGNOSIS — I10 Essential (primary) hypertension: Secondary | ICD-10-CM | POA: Insufficient documentation

## 2016-09-16 DIAGNOSIS — M79661 Pain in right lower leg: Secondary | ICD-10-CM | POA: Diagnosis present

## 2016-09-16 DIAGNOSIS — M79606 Pain in leg, unspecified: Secondary | ICD-10-CM

## 2016-09-16 NOTE — ED Notes (Signed)
[facility] called for transport.  

## 2016-09-16 NOTE — Progress Notes (Signed)
Orthopedic Tech Progress Note Patient Details:  Joshua ClampRonald E Mccormick 09/09/1965 [id number]  Ortho Devices Type of Ortho Device: Roland RackUnna boot Ortho Device/Splint Location: RLE Ortho Device/Splint Interventions: Ordered, Application   [name], [name] 09/16/2016, 10:24 PM
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
## 2016-09-16 NOTE — ED Triage Notes (Signed)
Per [facility]: [facility]. Patient states the swelling in his R leg has worsened over the past week and is causing more pain so that he can't bear weight. Patient is wheelchair-bound and slid out of wheelchair at facility. Pt states he did not fall, but instead slid down and sat on the floor, did not hit head or injury anything. Pt reports the only thing that helps his legs are the UNA boots. PT[facility] VS: 130/76, P 95 regular, R 20, 95% RA. Patient A&O x 4.

## 2016-09-16 NOTE — ED Notes (Signed)
MD at bedside. 

## 2016-09-16 NOTE — Progress Notes (Signed)
Orthopedic Tech Progress Note Patient Details:  Joshua ClampRonald E Mccormick 06/15/1965 [id number]  Ortho Devices Type of Ortho Device: Ace wrap, Unna boot Ortho Device/Splint Location: LLE Ortho Device/Splint Interventions: Ordered, Application   [name], [name] 09/16/2016, 10:50 PM

## 2016-09-16 NOTE — ED Notes (Signed)
Patient returned from CT and placed back on BP/pulse ox. Ortho tech in with patient now applying unna boot.

## 2016-09-16 NOTE — ED Notes (Signed)
Patient transported to xray via stretcher.

## 2016-09-17 NOTE — ED Notes (Signed)
[facility]. Patient states he was asleep and just decided to urinate in his brief. RN assisted patient into new brief, which would not fit him, so we provided stretcher with extra sheet and chux pads. RN also applied moisture barrier cream to bottom.

## 2016-09-19 ENCOUNTER — Encounter: Payer: Self-pay | Admitting: Adult Health

## 2016-09-19 ENCOUNTER — Non-Acute Institutional Stay (SKILLED_NURSING_FACILITY): Payer: Medicaid Other | Admitting: Adult Health

## 2016-09-19 DIAGNOSIS — E785 Hyperlipidemia, unspecified: Secondary | ICD-10-CM | POA: Diagnosis not present

## 2016-09-19 DIAGNOSIS — G4733 Obstructive sleep apnea (adult) (pediatric): Secondary | ICD-10-CM | POA: Diagnosis not present

## 2016-09-19 DIAGNOSIS — G458 Other transient cerebral ischemic attacks and related syndromes: Secondary | ICD-10-CM | POA: Diagnosis not present

## 2016-09-19 DIAGNOSIS — I10 Essential (primary) hypertension: Secondary | ICD-10-CM | POA: Diagnosis not present

## 2016-09-19 DIAGNOSIS — I890 Lymphedema, not elsewhere classified: Secondary | ICD-10-CM
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
## 2016-10-17 ENCOUNTER — Encounter: Payer: Self-pay | Admitting: Adult Health

## 2016-10-17 ENCOUNTER — Non-Acute Institutional Stay (SKILLED_NURSING_FACILITY): Payer: Medicaid Other | Admitting: Adult Health

## 2016-10-17 DIAGNOSIS — E785 Hyperlipidemia, unspecified: Secondary | ICD-10-CM | POA: Diagnosis not present

## 2016-10-17 DIAGNOSIS — I890 Lymphedema, not elsewhere classified: Secondary | ICD-10-CM | POA: Diagnosis not present

## 2016-10-17 DIAGNOSIS — G458 Other transient cerebral ischemic attacks and related syndromes: Secondary | ICD-10-CM | POA: Diagnosis not present

## 2016-10-17 DIAGNOSIS — G4733 Obstructive sleep apnea (adult) (pediatric): Secondary | ICD-10-CM

## 2016-10-17 DIAGNOSIS — I10 Essential (primary) hypertension: Secondary | ICD-10-CM | POA: Diagnosis not present
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
## 2016-12-11 ENCOUNTER — Encounter: Payer: Self-pay | Admitting: Internal Medicine

## 2016-12-11 ENCOUNTER — Ambulatory Visit (INDEPENDENT_AMBULATORY_CARE_PROVIDER_SITE_OTHER): Payer: Medicaid Other | Admitting: Internal Medicine

## 2016-12-11 ENCOUNTER — Telehealth: Payer: Self-pay | Admitting: Internal Medicine

## 2016-12-11 ENCOUNTER — Other Ambulatory Visit (INDEPENDENT_AMBULATORY_CARE_PROVIDER_SITE_OTHER): Payer: Medicaid Other

## 2016-12-11 VITALS — BP 114/68 | HR 82

## 2016-12-11 DIAGNOSIS — E039 Hypothyroidism, unspecified: Secondary | ICD-10-CM

## 2016-12-11 DIAGNOSIS — I890 Lymphedema, not elsewhere classified: Secondary | ICD-10-CM | POA: Diagnosis not present

## 2016-12-11 DIAGNOSIS — Z9114 Patient's other noncompliance with medication regimen: Secondary | ICD-10-CM

## 2016-12-11 DIAGNOSIS — G4733 Obstructive sleep apnea (adult) (pediatric): Secondary | ICD-10-CM

## 2016-12-11 LAB — TSH: TSH: 1.2 u[IU]/mL (ref 0.35–4.50)

## 2016-12-11 MED ORDER — CLONAZEPAM 0.5 MG PO TABS: ORAL_TABLET | ORAL | 5 refills | Status: DC

## 2016-12-11 NOTE — Telephone Encounter (Signed)
Called and spoke with Kathleene Hazelemeka, RN at the rehab facility that the pt is currently at.  She called to find out about the clonazepam that was prescribed for the pt.  She stated that the pt refuses to go to bed at night, goes outside to smoke at night and will stay up all night and then sleep all day.  She wanted to call to make CY aware of this.

## 2016-12-11 NOTE — Patient Instructions (Signed)
Order- schedule unattended home sleep test    Dx OSA  Order- lab- TSH    Dx hypothyroid  Script for clonazepam 0.5 mg    Take 1 for sleep, about 30 minutes before planned bedtime  Please call as needed
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
## 2016-12-11 NOTE — Telephone Encounter (Signed)
Thank you noted.

## 2016-12-15 NOTE — Assessment & Plan Note (Addendum)
After he had left this office visit, his care facilities [facility] message that he resists their efforts to get him to bed at a reasonable time, preferring to stay up most of the night smoking. Poor sleep hygiene and non-24-hour circadian rhythm. Plan-education begun. Lab for thyroid function. Trial of clonazepam suggested, seeking to help him establish and maintain a regular nocturnal sleep schedule.

## 2016-12-15 NOTE — Assessment & Plan Note (Signed)
He has severe chronic lymphedema with strong odor.

## 2016-12-15 NOTE — Assessment & Plan Note (Signed)
It will be difficult for him to manage transportation and logistics for a sleep study overnight at the sleep disorder center. If he can be qualified for a home sleep test that will update documentation. Now that he has a regular place to sleep, according to him, he may be able to manage CPAP.

## 2016-12-24 ENCOUNTER — Other Ambulatory Visit: Payer: Self-pay | Admitting: Internal Medicine

## 2016-12-24 DIAGNOSIS — G4733 Obstructive sleep apnea (adult) (pediatric): Secondary | ICD-10-CM

## 2017-01-16 ENCOUNTER — Ambulatory Visit: Payer: Medicaid Other | Admitting: Internal Medicine

## 2017-02-14 ENCOUNTER — Ambulatory Visit (HOSPITAL_BASED_OUTPATIENT_CLINIC_OR_DEPARTMENT_OTHER): Payer: Medicaid Other | Attending: Internal Medicine

## 2017-02-26 ENCOUNTER — Ambulatory Visit: Payer: Medicaid Other | Admitting: Internal Medicine

## 2017-03-17 ENCOUNTER — Ambulatory Visit: Payer: Medicaid Other | Admitting: Internal Medicine

## 2017-03-20 ENCOUNTER — Encounter (HOSPITAL_BASED_OUTPATIENT_CLINIC_OR_DEPARTMENT_OTHER): Payer: Self-pay

## 2017-03-20 DIAGNOSIS — F5101 Primary insomnia: Secondary | ICD-10-CM

## 2017-03-20 DIAGNOSIS — R5383 Other fatigue: Secondary | ICD-10-CM

## 2017-03-28 ENCOUNTER — Emergency Department (HOSPITAL_COMMUNITY)
Admission: EM | Admit: 2017-03-28 | Discharge: 2017-03-28 | Disposition: A | Discharge status: Home / Self Care | Payer: Medicaid Other | Attending: Emergency Medicine | Admitting: Emergency Medicine

## 2017-03-28 ENCOUNTER — Emergency Department (HOSPITAL_COMMUNITY): Payer: Medicaid Other

## 2017-03-28 ENCOUNTER — Encounter (HOSPITAL_COMMUNITY): Payer: Self-pay | Admitting: Nurse Practitioner

## 2017-03-28 ENCOUNTER — Other Ambulatory Visit: Payer: Self-pay

## 2017-03-28 DIAGNOSIS — G8191 Hemiplegia, unspecified affecting right dominant side: Secondary | ICD-10-CM | POA: Insufficient documentation

## 2017-03-28 DIAGNOSIS — R410 Disorientation, unspecified: Secondary | ICD-10-CM | POA: Insufficient documentation

## 2017-03-28 DIAGNOSIS — Z8673 Personal history of transient ischemic attack (TIA), and cerebral infarction without residual deficits: Secondary | ICD-10-CM | POA: Insufficient documentation

## 2017-03-28 DIAGNOSIS — R600 Localized edema: Secondary | ICD-10-CM | POA: Diagnosis not present

## 2017-03-28 DIAGNOSIS — F17210 Nicotine dependence, cigarettes, uncomplicated: Secondary | ICD-10-CM | POA: Insufficient documentation

## 2017-03-28 DIAGNOSIS — E860 Dehydration: Secondary | ICD-10-CM | POA: Diagnosis not present

## 2017-03-28 DIAGNOSIS — I10 Essential (primary) hypertension: Secondary | ICD-10-CM | POA: Insufficient documentation

## 2017-03-28 DIAGNOSIS — Z79899 Other long term (current) drug therapy: Secondary | ICD-10-CM | POA: Diagnosis not present

## 2017-03-28 DIAGNOSIS — Z7982 Long term (current) use of aspirin: Secondary | ICD-10-CM | POA: Insufficient documentation

## 2017-03-28 LAB — CBC WITH DIFFERENTIAL/PLATELET
BASOS PCT: 0 %
Basophils Absolute: 0 10*3/uL (ref 0.0–0.1)
Eosinophils Absolute: 0.1 10*3/uL (ref 0.0–0.7)
Eosinophils Relative: 1 %
HEMATOCRIT: 39.1 % (ref 39.0–52.0)
Hemoglobin: 13.3 g/dL (ref 13.0–17.0)
LYMPHS PCT: 25 %
Lymphs Abs: 1.3 10*3/uL (ref 0.7–4.0)
MCH: 28.7 pg (ref 26.0–34.0)
MCHC: 34 g/dL (ref 30.0–36.0)
MCV: 84.3 fL (ref 78.0–100.0)
MONOS PCT: 9 %
Monocytes Absolute: 0.5 10*3/uL (ref 0.1–1.0)
NEUTROS ABS: 3.4 10*3/uL (ref 1.7–7.7)
Neutrophils Relative %: 65 %
Platelets: 74 10*3/uL — ABNORMAL LOW (ref 150–400)
RBC: 4.64 MIL/uL (ref 4.22–5.81)
RDW: 18.1 % — AB (ref 11.5–15.5)
WBC: 5.3 10*3/uL (ref 4.0–10.5)

## 2017-03-28 LAB — URINALYSIS, ROUTINE W REFLEX MICROSCOPIC
Bilirubin Urine: NEGATIVE
GLUCOSE, UA: NEGATIVE mg/dL
KETONES UR: NEGATIVE mg/dL
NITRITE: NEGATIVE
PH: 6 (ref 5.0–8.0)
Protein, ur: NEGATIVE mg/dL
Specific Gravity, Urine: 1.005 (ref 1.005–1.030)

## 2017-03-28 LAB — COMPREHENSIVE METABOLIC PANEL
ALK PHOS: 151 U/L — AB (ref 38–126)
ALT: 108 U/L — AB (ref 17–63)
AST: 51 U/L — AB (ref 15–41)
Albumin: 3.7 g/dL (ref 3.5–5.0)
Anion gap: 11 (ref 5–15)
BUN: 23 mg/dL — AB (ref 6–20)
CALCIUM: 9.3 mg/dL (ref 8.9–10.3)
CO2: 27 mmol/L (ref 22–32)
CREATININE: 1.46 mg/dL — AB (ref 0.61–1.24)
Chloride: 104 mmol/L (ref 101–111)
GFR calc Af Amer: >60 mL/min (ref >60)
GFR calc non Af Amer: 54 mL/min — ABNORMAL LOW (ref >60)
GLUCOSE: 90 mg/dL (ref 65–99)
Potassium: 4.5 mmol/L (ref 3.5–5.1)
SODIUM: 142 mmol/L (ref 135–145)
Total Bilirubin: 0.9 mg/dL (ref 0.3–1.2)
Total Protein: 8.2 g/dL — ABNORMAL HIGH (ref 6.5–8.1)

## 2017-03-28 LAB — RAPID URINE DRUG SCREEN, HOSP PERFORMED
AMPHETAMINES: NOT DETECTED
Barbiturates: NOT DETECTED
Benzodiazepines: NOT DETECTED
Cocaine: NOT DETECTED
Opiates: NOT DETECTED
Tetrahydrocannabinol: NOT DETECTED

## 2017-03-28 LAB — ETHANOL

## 2017-03-28 LAB — APTT: aPTT: 22 seconds — ABNORMAL LOW (ref 24–36)

## 2017-03-28 LAB — I-STAT TROPONIN, ED: Troponin i, poc: 0 ng/mL (ref 0.00–0.08)

## 2017-03-28 LAB — PROTIME-INR
INR: 0.97
Prothrombin Time: 12.8 seconds (ref 11.4–15.2)

## 2017-03-28 MED ORDER — SODIUM CHLORIDE 0.9 % IV BOLUS (SEPSIS)
500.0000 mL | Freq: Once | INTRAVENOUS | Status: AC
  Administered 2017-03-28: 500 mL via INTRAVENOUS

## 2017-03-28 NOTE — ED Triage Notes (Signed)
[facility] is [facility]. Staff states he is more confused than normal. Patient also had complaint feeling like he was falling while he was laying in the bed.
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
## 2017-03-28 NOTE — ED Notes (Signed)
Bed: [id number] Expected date:  Expected time:  Means of arrival:  Comments: EMS AMS from nursing home

## 2017-03-28 NOTE — Discharge Instructions (Signed)
Continue your current medications, follow-up with your primary care doctor °

## 2017-04-02 ENCOUNTER — Inpatient Hospital Stay (HOSPITAL_COMMUNITY)
Admission: EM | Admit: 2017-04-02 | Discharge: 2017-04-05 | DRG: 699 | Disposition: A | Discharge status: Skilled Nursing Facility | Payer: Medicaid Other | Attending: Family Medicine | Admitting: Family Medicine

## 2017-04-02 ENCOUNTER — Emergency Department (HOSPITAL_COMMUNITY): Payer: Medicaid Other

## 2017-04-02 ENCOUNTER — Encounter (HOSPITAL_COMMUNITY): Payer: Self-pay | Admitting: Emergency Medicine

## 2017-04-02 DIAGNOSIS — Z789 Other specified health status: Secondary | ICD-10-CM

## 2017-04-02 DIAGNOSIS — N179 Acute kidney failure, unspecified: Secondary | ICD-10-CM | POA: Diagnosis present

## 2017-04-02 DIAGNOSIS — E861 Hypovolemia: Secondary | ICD-10-CM | POA: Diagnosis present

## 2017-04-02 DIAGNOSIS — Z6839 Body mass index (BMI) 39.0-39.9, adult: Secondary | ICD-10-CM

## 2017-04-02 DIAGNOSIS — F17210 Nicotine dependence, cigarettes, uncomplicated: Secondary | ICD-10-CM | POA: Diagnosis present

## 2017-04-02 DIAGNOSIS — I10 Essential (primary) hypertension: Secondary | ICD-10-CM | POA: Diagnosis present

## 2017-04-02 DIAGNOSIS — Z79899 Other long term (current) drug therapy: Secondary | ICD-10-CM

## 2017-04-02 DIAGNOSIS — G47419 Narcolepsy without cataplexy: Secondary | ICD-10-CM | POA: Diagnosis present

## 2017-04-02 DIAGNOSIS — Z66 Do not resuscitate: Secondary | ICD-10-CM | POA: Diagnosis present

## 2017-04-02 DIAGNOSIS — I890 Lymphedema, not elsewhere classified: Secondary | ICD-10-CM

## 2017-04-02 DIAGNOSIS — Y846 Urinary catheterization as the cause of abnormal reaction of the patient, or of later complication, without mention of misadventure at the time of the procedure: Secondary | ICD-10-CM | POA: Diagnosis present

## 2017-04-02 DIAGNOSIS — A419 Sepsis, unspecified organism: Secondary | ICD-10-CM | POA: Diagnosis present

## 2017-04-02 DIAGNOSIS — R4182 Altered mental status, unspecified: Secondary | ICD-10-CM | POA: Diagnosis present

## 2017-04-02 DIAGNOSIS — E785 Hyperlipidemia, unspecified: Secondary | ICD-10-CM | POA: Diagnosis present

## 2017-04-02 DIAGNOSIS — G8191 Hemiplegia, unspecified affecting right dominant side: Secondary | ICD-10-CM | POA: Diagnosis present

## 2017-04-02 DIAGNOSIS — E039 Hypothyroidism, unspecified: Secondary | ICD-10-CM | POA: Diagnosis present

## 2017-04-02 DIAGNOSIS — I69398 Other sequelae of cerebral infarction: Secondary | ICD-10-CM

## 2017-04-02 DIAGNOSIS — L210 Seborrhea capitis: Secondary | ICD-10-CM | POA: Diagnosis present

## 2017-04-02 DIAGNOSIS — T83511A Infection and inflammatory reaction due to indwelling urethral catheter, initial encounter: Principal | ICD-10-CM | POA: Diagnosis present

## 2017-04-02 DIAGNOSIS — G4733 Obstructive sleep apnea (adult) (pediatric): Secondary | ICD-10-CM | POA: Diagnosis present

## 2017-04-02 DIAGNOSIS — N390 Urinary tract infection, site not specified: Secondary | ICD-10-CM | POA: Diagnosis present

## 2017-04-02 DIAGNOSIS — E860 Dehydration: Secondary | ICD-10-CM

## 2017-04-02 DIAGNOSIS — Z593 Problems related to living in residential institution: Secondary | ICD-10-CM

## 2017-04-02 DIAGNOSIS — Z7982 Long term (current) use of aspirin: Secondary | ICD-10-CM

## 2017-04-02 DIAGNOSIS — I69359 Hemiplegia and hemiparesis following cerebral infarction affecting unspecified side: Secondary | ICD-10-CM

## 2017-04-02 DIAGNOSIS — J449 Chronic obstructive pulmonary disease, unspecified: Secondary | ICD-10-CM | POA: Diagnosis present

## 2017-04-02 DIAGNOSIS — K219 Gastro-esophageal reflux disease without esophagitis: Secondary | ICD-10-CM | POA: Diagnosis present

## 2017-04-02 DIAGNOSIS — I959 Hypotension, unspecified: Secondary | ICD-10-CM | POA: Diagnosis present

## 2017-04-02 HISTORY — DX: Pneumonia, unspecified organism: J18.9

## 2017-04-02 LAB — COMPREHENSIVE METABOLIC PANEL
ALBUMIN: 3.3 g/dL — AB (ref 3.5–5.0)
ALT: 41 U/L (ref 17–63)
AST: 28 U/L (ref 15–41)
Alkaline Phosphatase: 148 U/L — ABNORMAL HIGH (ref 38–126)
Anion gap: 13 (ref 5–15)
BUN: 28 mg/dL — AB (ref 6–20)
CHLORIDE: 102 mmol/L (ref 101–111)
CO2: 23 mmol/L (ref 22–32)
CREATININE: 1.96 mg/dL — AB (ref 0.61–1.24)
Calcium: 9 mg/dL (ref 8.9–10.3)
GFR calc Af Amer: 44 mL/min — ABNORMAL LOW (ref >60)
GFR, EST NON AFRICAN AMERICAN: 38 mL/min — AB (ref >60)
GLUCOSE: 104 mg/dL — AB (ref 65–99)
POTASSIUM: 3.9 mmol/L (ref 3.5–5.1)
Sodium: 138 mmol/L (ref 135–145)
Total Bilirubin: 1.1 mg/dL (ref 0.3–1.2)
Total Protein: 8 g/dL (ref 6.5–8.1)

## 2017-04-02 LAB — CBC WITH DIFFERENTIAL/PLATELET
BASOS ABS: 0 10*3/uL (ref 0.0–0.1)
BASOS PCT: 0 %
Eosinophils Absolute: 0 10*3/uL (ref 0.0–0.7)
Eosinophils Relative: 0 %
HCT: 40.9 % (ref 39.0–52.0)
HEMOGLOBIN: 14.2 g/dL (ref 13.0–17.0)
LYMPHS ABS: 1.8 10*3/uL (ref 0.7–4.0)
LYMPHS PCT: 20 %
MCH: 29.4 pg (ref 26.0–34.0)
MCHC: 34.7 g/dL (ref 30.0–36.0)
MCV: 84.7 fL (ref 78.0–100.0)
Monocytes Absolute: 0.5 10*3/uL (ref 0.1–1.0)
Monocytes Relative: 6 %
Neutro Abs: 6.6 10*3/uL (ref 1.7–7.7)
Neutrophils Relative %: 74 %
Platelets: 302 10*3/uL (ref 150–400)
RBC: 4.83 MIL/uL (ref 4.22–5.81)
RDW: 17.5 % — AB (ref 11.5–15.5)
WBC: 9 10*3/uL (ref 4.0–10.5)

## 2017-04-02 LAB — I-STAT TROPONIN, ED: Troponin i, poc: 0 ng/mL (ref 0.00–0.08)

## 2017-04-02 LAB — I-STAT CG4 LACTIC ACID, ED: LACTIC ACID, VENOUS: 1.21 mmol/L (ref 0.5–1.9)

## 2017-04-02 MED ORDER — SODIUM CHLORIDE 0.9 % IV BOLUS (SEPSIS)
1000.0000 mL | Freq: Once | INTRAVENOUS | Status: AC
  Administered 2017-04-03: 1000 mL via INTRAVENOUS

## 2017-04-02 MED ORDER — IPRATROPIUM BROMIDE 0.02 % IN SOLN
0.5000 mg | Freq: Once | RESPIRATORY_TRACT | Status: AC
  Administered 2017-04-02: 0.5 mg via RESPIRATORY_TRACT
  Filled 2017-04-02: qty 2.5

## 2017-04-02 MED ORDER — PIPERACILLIN-TAZOBACTAM 3.375 G IVPB 30 MIN
3.3750 g | Freq: Once | INTRAVENOUS | Status: AC
  Administered 2017-04-03: 3.375 g via INTRAVENOUS
  Filled 2017-04-02: qty 50

## 2017-04-02 MED ORDER — SODIUM CHLORIDE 0.9 % IV BOLUS (SEPSIS)
1000.0000 mL | Freq: Once | INTRAVENOUS | Status: AC
  Administered 2017-04-02: 1000 mL via INTRAVENOUS

## 2017-04-02 MED ORDER — METHYLPREDNISOLONE SODIUM SUCC 125 MG IJ SOLR
125.0000 mg | Freq: Once | INTRAMUSCULAR | Status: AC
  Administered 2017-04-02: 125 mg via INTRAVENOUS
  Filled 2017-04-02: qty 2

## 2017-04-02 MED ORDER — ALBUTEROL SULFATE (2.5 MG/3ML) 0.083% IN NEBU
5.0000 mg | INHALATION_SOLUTION | Freq: Once | RESPIRATORY_TRACT | Status: AC
  Administered 2017-04-02: 5 mg via RESPIRATORY_TRACT
  Filled 2017-04-02: qty 6

## 2017-04-02 NOTE — ED Notes (Signed)
Patient transported to X-ray 

## 2017-04-02 NOTE — ED Triage Notes (Signed)
Per EMS[facility] with altered mental status x 1.5 weeks. Staff reports pt has been hallucinating and was unresponsive this evening. Upon arrival to ED, pt alert to person and place. Hypotensive with EMS, given 200ccNS PTA, Foley catheter in place.
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
## 2017-04-03 ENCOUNTER — Other Ambulatory Visit: Payer: Self-pay

## 2017-04-03 ENCOUNTER — Encounter (HOSPITAL_COMMUNITY): Payer: Self-pay | Admitting: General Practice

## 2017-04-03 DIAGNOSIS — Z66 Do not resuscitate: Secondary | ICD-10-CM | POA: Diagnosis present

## 2017-04-03 DIAGNOSIS — I890 Lymphedema, not elsewhere classified: Secondary | ICD-10-CM

## 2017-04-03 DIAGNOSIS — I69359 Hemiplegia and hemiparesis following cerebral infarction affecting unspecified side: Secondary | ICD-10-CM | POA: Diagnosis not present

## 2017-04-03 DIAGNOSIS — R410 Disorientation, unspecified: Secondary | ICD-10-CM

## 2017-04-03 DIAGNOSIS — G4733 Obstructive sleep apnea (adult) (pediatric): Secondary | ICD-10-CM

## 2017-04-03 DIAGNOSIS — E861 Hypovolemia: Secondary | ICD-10-CM

## 2017-04-03 DIAGNOSIS — T83511A Infection and inflammatory reaction due to indwelling urethral catheter, initial encounter: Principal | ICD-10-CM

## 2017-04-03 DIAGNOSIS — Z593 Problems related to living in residential institution: Secondary | ICD-10-CM

## 2017-04-03 DIAGNOSIS — E860 Dehydration: Secondary | ICD-10-CM

## 2017-04-03 DIAGNOSIS — Y846 Urinary catheterization as the cause of abnormal reaction of the patient, or of later complication, without mention of misadventure at the time of the procedure: Secondary | ICD-10-CM | POA: Diagnosis present

## 2017-04-03 DIAGNOSIS — Z6839 Body mass index (BMI) 39.0-39.9, adult: Secondary | ICD-10-CM | POA: Diagnosis not present

## 2017-04-03 DIAGNOSIS — R4182 Altered mental status, unspecified: Secondary | ICD-10-CM

## 2017-04-03 DIAGNOSIS — I959 Hypotension, unspecified: Secondary | ICD-10-CM | POA: Diagnosis present

## 2017-04-03 DIAGNOSIS — I10 Essential (primary) hypertension: Secondary | ICD-10-CM | POA: Diagnosis present

## 2017-04-03 DIAGNOSIS — N390 Urinary tract infection, site not specified: Secondary | ICD-10-CM

## 2017-04-03 DIAGNOSIS — I9589 Other hypotension: Secondary | ICD-10-CM

## 2017-04-03 DIAGNOSIS — J449 Chronic obstructive pulmonary disease, unspecified: Secondary | ICD-10-CM | POA: Diagnosis present

## 2017-04-03 DIAGNOSIS — N179 Acute kidney failure, unspecified: Secondary | ICD-10-CM | POA: Diagnosis present

## 2017-04-03 DIAGNOSIS — E039 Hypothyroidism, unspecified: Secondary | ICD-10-CM | POA: Diagnosis present

## 2017-04-03 DIAGNOSIS — K219 Gastro-esophageal reflux disease without esophagitis: Secondary | ICD-10-CM | POA: Diagnosis present

## 2017-04-03 DIAGNOSIS — G8191 Hemiplegia, unspecified affecting right dominant side: Secondary | ICD-10-CM | POA: Diagnosis present

## 2017-04-03 DIAGNOSIS — A419 Sepsis, unspecified organism: Secondary | ICD-10-CM

## 2017-04-03 DIAGNOSIS — E785 Hyperlipidemia, unspecified: Secondary | ICD-10-CM | POA: Diagnosis present

## 2017-04-03 DIAGNOSIS — L210 Seborrhea capitis: Secondary | ICD-10-CM | POA: Diagnosis present

## 2017-04-03 DIAGNOSIS — F17210 Nicotine dependence, cigarettes, uncomplicated: Secondary | ICD-10-CM | POA: Diagnosis present

## 2017-04-03 DIAGNOSIS — Z7982 Long term (current) use of aspirin: Secondary | ICD-10-CM | POA: Diagnosis not present

## 2017-04-03 DIAGNOSIS — M24521 Contracture, right elbow: Secondary | ICD-10-CM | POA: Insufficient documentation

## 2017-04-03 DIAGNOSIS — I69398 Other sequelae of cerebral infarction: Secondary | ICD-10-CM | POA: Diagnosis not present

## 2017-04-03 DIAGNOSIS — Z789 Other specified health status: Secondary | ICD-10-CM

## 2017-04-03 DIAGNOSIS — Z79899 Other long term (current) drug therapy: Secondary | ICD-10-CM | POA: Diagnosis not present

## 2017-04-03 DIAGNOSIS — G47419 Narcolepsy without cataplexy: Secondary | ICD-10-CM | POA: Diagnosis present

## 2017-04-03 HISTORY — DX: Hypotension, unspecified: I95.9

## 2017-04-03 HISTORY — DX: Seborrhea capitis: L21.0

## 2017-04-03 HISTORY — DX: Altered mental status, unspecified: R41.82

## 2017-04-03 LAB — HEMOGLOBIN A1C
HEMOGLOBIN A1C: 6.1 % — AB (ref 4.8–5.6)
MEAN PLASMA GLUCOSE: 128.37 mg/dL

## 2017-04-03 LAB — URINALYSIS, ROUTINE W REFLEX MICROSCOPIC
BACTERIA UA: NONE SEEN
Bilirubin Urine: NEGATIVE
GLUCOSE, UA: NEGATIVE mg/dL
Ketones, ur: NEGATIVE mg/dL
NITRITE: NEGATIVE
Protein, ur: 30 mg/dL — AB
Specific Gravity, Urine: 1.021 (ref 1.005–1.030)
pH: 5 (ref 5.0–8.0)

## 2017-04-03 LAB — CBC
HCT: 37.4 % — ABNORMAL LOW (ref 39.0–52.0)
HEMOGLOBIN: 12.8 g/dL — AB (ref 13.0–17.0)
MCH: 29.2 pg (ref 26.0–34.0)
MCHC: 34.2 g/dL (ref 30.0–36.0)
MCV: 85.2 fL (ref 78.0–100.0)
Platelets: 319 10*3/uL (ref 150–400)
RBC: 4.39 MIL/uL (ref 4.22–5.81)
RDW: 17.3 % — ABNORMAL HIGH (ref 11.5–15.5)
WBC: 9.3 10*3/uL (ref 4.0–10.5)

## 2017-04-03 LAB — CREATININE, SERUM
CREATININE: 1.69 mg/dL — AB (ref 0.61–1.24)
GFR calc Af Amer: 52 mL/min — ABNORMAL LOW (ref >60)
GFR, EST NON AFRICAN AMERICAN: 45 mL/min — AB (ref >60)

## 2017-04-03 MED ORDER — FLUTICASONE PROPIONATE 50 MCG/ACT NA SUSP
1.0000 | Freq: Every day | NASAL | Status: DC
  Administered 2017-04-03 – 2017-04-05 (×3): 1 via NASAL
  Filled 2017-04-03: qty 16

## 2017-04-03 MED ORDER — SODIUM CHLORIDE 0.9 % IV SOLN
1250.0000 mg | INTRAVENOUS | Status: DC
  Administered 2017-04-04: 1250 mg via INTRAVENOUS
  Filled 2017-04-03: qty 1250

## 2017-04-03 MED ORDER — LEVOTHYROXINE SODIUM 50 MCG PO TABS
50.0000 ug | ORAL_TABLET | Freq: Every day | ORAL | Status: DC
  Administered 2017-04-03 – 2017-04-05 (×3): 50 ug via ORAL
  Filled 2017-04-03 (×3): qty 1

## 2017-04-03 MED ORDER — ASPIRIN EC 81 MG PO TBEC
81.0000 mg | DELAYED_RELEASE_TABLET | Freq: Every day | ORAL | Status: DC
  Administered 2017-04-03 – 2017-04-05 (×3): 81 mg via ORAL
  Filled 2017-04-03 (×3): qty 1

## 2017-04-03 MED ORDER — LORATADINE 10 MG PO TABS
10.0000 mg | ORAL_TABLET | Freq: Every day | ORAL | Status: DC
  Administered 2017-04-03 – 2017-04-05 (×3): 10 mg via ORAL
  Filled 2017-04-03 (×3): qty 1

## 2017-04-03 MED ORDER — DEXTROSE-NACL 5-0.9 % IV SOLN
INTRAVENOUS | Status: DC
  Administered 2017-04-03 – 2017-04-05 (×4): via INTRAVENOUS

## 2017-04-03 MED ORDER — POLYETHYLENE GLYCOL 3350 17 G PO PACK: 17.0000 g | PACK | Freq: Every day | ORAL | Status: DC | PRN

## 2017-04-03 MED ORDER — PIPERACILLIN-TAZOBACTAM 3.375 G IVPB
3.3750 g | Freq: Three times a day (TID) | INTRAVENOUS | Status: DC
  Administered 2017-04-03 – 2017-04-04 (×3): 3.375 g via INTRAVENOUS
  Filled 2017-04-03 (×4): qty 50

## 2017-04-03 MED ORDER — DOCUSATE SODIUM 100 MG PO CAPS
100.0000 mg | ORAL_CAPSULE | Freq: Every day | ORAL | Status: DC
  Administered 2017-04-03 – 2017-04-05 (×3): 100 mg via ORAL
  Filled 2017-04-03 (×3): qty 1

## 2017-04-03 MED ORDER — ENSURE ENLIVE PO LIQD
237.0000 mL | Freq: Two times a day (BID) | ORAL | Status: DC
  Administered 2017-04-04 – 2017-04-05 (×4): 237 mL via ORAL

## 2017-04-03 MED ORDER — VANCOMYCIN HCL 10 G IV SOLR
2000.0000 mg | Freq: Once | INTRAVENOUS | Status: AC
  Administered 2017-04-03: 2000 mg via INTRAVENOUS
  Filled 2017-04-03: qty 2000

## 2017-04-03 MED ORDER — HEPARIN SODIUM (PORCINE) 5000 UNIT/ML IJ SOLN
5000.0000 [IU] | Freq: Three times a day (TID) | INTRAMUSCULAR | Status: DC
  Administered 2017-04-03 – 2017-04-05 (×7): 5000 [IU] via SUBCUTANEOUS
  Filled 2017-04-03 (×7): qty 1

## 2017-04-03 MED ORDER — ATORVASTATIN CALCIUM 10 MG PO TABS
10.0000 mg | ORAL_TABLET | Freq: Every day | ORAL | Status: DC
  Administered 2017-04-03 – 2017-04-04 (×2): 10 mg via ORAL
  Filled 2017-04-03 (×2): qty 1

## 2017-04-03 MED ORDER — SODIUM CHLORIDE 0.9 % IV SOLN
2250.0000 mg | Freq: Once | INTRAVENOUS | Status: DC
  Filled 2017-04-03: qty 2250

## 2017-04-03 MED ORDER — CLOTRIMAZOLE 1 % EX CREA
TOPICAL_CREAM | Freq: Two times a day (BID) | CUTANEOUS | Status: DC
  Administered 2017-04-03 – 2017-04-05 (×5): via TOPICAL
  Filled 2017-04-03: qty 15

## 2017-04-03 NOTE — ED Notes (Signed)
Pt arrived with foley catheter in place; RN replaced collection bag with new collection bag and will collect urine sample from new tubing

## 2017-04-03 NOTE — ED Notes (Signed)
Ron [employer] Sr[employer] (father): [phone]

## 2017-04-03 NOTE — Progress Notes (Signed)
Pt had arrived with 7816fr/10cc foley catheter from SNF. Replaced foley catheter per Md order. Clear, yellow urine noted in bag secured, unclamped. Pt denies pain or discomfort. Will continue to monitor.

## 2017-04-03 NOTE — Progress Notes (Signed)
Pharmacy Antibiotic Note  [name] is a 51 y.o. male admitted on 04/02/2017 with AMS.  Pharmacy has been consulted for vancomycin dosing for sepsis. AKI noted, SCr 1.96 (baseline ~1-1.2). Zosyn has also been ordered.  Estimated height is 68", weight 260 lb Norm CrCl ~45 ml/min  Plan: Vancomycin 2250 mg IV load then 1250 mg IV q24h, goal 15-20 mcg/ml Monitor renal function, clinical progress, and culture data Obtain more accurate height and weight and adjust dosing as needed      Temp (24hrs), Avg:100 F (37.8 C), Min:100 F (37.8 C), Max:100 F (37.8 C)  Recent Labs  Lab 03/28/17 1315 03/28/17 1342 04/02/17 2226 04/02/17 2239  WBC  --  5.3 9.0  --   CREATININE 1.46*  --  1.96*  --   LATICACIDVEN  --   --   --  1.21    CrCl cannot be calculated (Unknown ideal weight.).    No Known Allergies   Thank you for allowing pharmacy to be a part of this patient's care.  Loura BackJennifer [place], PharmD, BCPS Clinical Pharmacist Main pharmacy - [phone] 04/03/2017 12:09 AM

## 2017-04-03 NOTE — ED Notes (Signed)
Urine to be recollected d/t sunquest label that was placed on specimens not accepted by lab; will recollect and label with white registration labels

## 2017-04-03 NOTE — Progress Notes (Signed)
Pharmacy Antibiotic Note  Shawna ClampRonald E Farone is a 51 y.o. male admitted on 04/02/2017 with sepsis.  Pharmacy has been consulted for Zosyn dosing. Already on vancomycin. Noted renal dysfunction.  Plan: Zosyn 3.375G IV q8h to be infused over 4 hours  Height: 5\' 8"  (172.7 cm) Weight: 260 lb (117.9 kg) IBW/kg (Calculated) : 68.4  Temp (24hrs), Avg:100 F (37.8 C), Min:100 F (37.8 C), Max:100 F (37.8 C)  Recent Labs  Lab 03/28/17 1315 03/28/17 1342 04/02/17 2226 04/02/17 2239  WBC  --  5.3 9.0  --   CREATININE 1.46*  --  1.96*  --   LATICACIDVEN  --   --   --  1.21    Estimated Creatinine Clearance: 55.6 mL/min (A) (by C-G formula based on SCr of 1.96 mg/dL (H)).    No Known Allergies    Abran DukeLedford, Takeila Thayne 04/03/2017 6:37 AM
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
## 2017-04-04 LAB — BASIC METABOLIC PANEL
Anion gap: 7 (ref 5–15)
BUN: 23 mg/dL — AB (ref 6–20)
CO2: 24 mmol/L (ref 22–32)
Calcium: 8.4 mg/dL — ABNORMAL LOW (ref 8.9–10.3)
Chloride: 108 mmol/L (ref 101–111)
Creatinine, Ser: 1.28 mg/dL — ABNORMAL HIGH (ref 0.61–1.24)
GFR calc Af Amer: >60 mL/min (ref >60)
Glucose, Bld: 91 mg/dL (ref 65–99)
Potassium: 3.4 mmol/L — ABNORMAL LOW (ref 3.5–5.1)
SODIUM: 139 mmol/L (ref 135–145)

## 2017-04-04 LAB — CBC
HCT: 34.6 % — ABNORMAL LOW (ref 39.0–52.0)
Hemoglobin: 11.7 g/dL — ABNORMAL LOW (ref 13.0–17.0)
MCH: 29.1 pg (ref 26.0–34.0)
MCHC: 33.8 g/dL (ref 30.0–36.0)
MCV: 86.1 fL (ref 78.0–100.0)
PLATELETS: 327 10*3/uL (ref 150–400)
RBC: 4.02 MIL/uL — ABNORMAL LOW (ref 4.22–5.81)
RDW: 17.5 % — ABNORMAL HIGH (ref 11.5–15.5)
WBC: 9.3 10*3/uL (ref 4.0–10.5)

## 2017-04-04 LAB — LIPID PANEL
Cholesterol: 136 mg/dL (ref 0–200)
HDL: 45 mg/dL (ref >40)
LDL Cholesterol: 77 mg/dL (ref 0–99)
Total CHOL/HDL Ratio: 3 RATIO
Triglycerides: 72 mg/dL (ref <150)
VLDL: 14 mg/dL (ref 0–40)

## 2017-04-04 LAB — HIV ANTIBODY (ROUTINE TESTING W REFLEX): HIV Screen 4th Generation wRfx: NONREACTIVE

## 2017-04-04 LAB — MRSA PCR SCREENING: MRSA BY PCR: NEGATIVE

## 2017-04-04 MED ORDER — VANCOMYCIN HCL IN DEXTROSE 750-5 MG/150ML-% IV SOLN
750.0000 mg | Freq: Two times a day (BID) | INTRAVENOUS | Status: DC
  Filled 2017-04-04: qty 150

## 2017-04-04 MED ORDER — AMOXICILLIN-POT CLAVULANATE 875-125 MG PO TABS
1.0000 | ORAL_TABLET | Freq: Two times a day (BID) | ORAL | Status: DC
  Administered 2017-04-04 – 2017-04-05 (×2): 1 via ORAL
  Filled 2017-04-04 (×3): qty 1

## 2017-04-04 NOTE — Clinical Social Work Note (Signed)
Clinical Social Work Assessment  Patient Details  Name: [name] MRN: [id number] Date of Birth: 02/06/1966  Date of referral:  04/04/17               Reason for consult:  Facility Placement                Permission sought to share information with:  Facility Medical sales representativeContact Representative, Family Supports Permission granted to share information::  Yes, Verbal Permission Granted  Name::     Conservator, museum/galleryonald  Agency::  [facility]  Relationship::  Father  SolicitorContact Information:     Housing/Transportation Living arrangements for the past 2 months:  [facility] Building surveyorursing Facility Source of Information:  Other (Comment Required)([facility] Financial [facility]) Patient Interpreter Needed:  None Criminal Activity/Legal Involvement Pertinent to Current Situation/Hospitalization:  No - Comment as needed Significant Relationships:  Parents Lives with:  Self, Facility Resident Do you feel safe going back to the place where you live?  Yes Need for family participation in patient care:  Yes (Comment)(patient not oriented)  Care giving concerns:  Patient has been residing at [facility] term and will return for care when medically stable.   Social Worker assessment / plan:  [name] attempted to contact family to confirm the patient's care plan at discharge. [name] attempted to contact the patient's father, but the phone did not ring and there's no voicemail set up for the number listed. [name] contacted [name], previously listed as a contact in the patient's chart but now removed, and discussed his relationship to the patient. [name] removed [name]'s contact information from the patient's chart as he is no longer involved in the patient's care. [name] confirmed with representative from [facility] that the patient is a long term resident. [name] to coordinate paperwork needed and transition back to facility when medically ready.   Employment status:  Disabled (Comment on whether or not currently receiving Disability) Insurance  information[place]  [place] Recommend[place]  Not assessed at this time Information / Referral to community resources:     Patient/Family's Response to care:  Patient's response unable to be assessed due to altered mental status. Patient's father is not reachable by phone at this time. [facility] indicated that [facility] is a long term resident at SNF due to his chronic illnesses.  Patient/Family's Understanding of and Emotional Response to Diagnosis, Current Treatment, and Prognosis:  Patient's understanding unable to be assessed at this time due to altered mental status. Patient's father is unreachable by phone at this time to participate in assessment; will update when he is able to be reached. [name] discussed how he was previously the patient's case worker when [facility] was [facility], but the patient isn't involved with them anymore since he went to the nursing facility. [name] indicated that he didn't know the exact date that the patient admitted to the facility but that it was a while ago. [name] [facility] also [facility], either, so he requested that his contact information be removed because he no longer has information on the patient.   Emotional Assessment Appearance:  Appears stated age Attitude/Demeanor/Rapport:  Unable to Assess Affect (typically observed):  Unable to Assess Orientation:  Oriented to Self, Oriented to Place Alcohol / Substance use:  Not Applicable Psych involvement (Current and /or in the community):  No (Comment)  Discharge Needs  Concerns to be addressed:  Care Coordination Readmission within the last 30 days:  No Current discharge risk:  Physical Impairment, Cognitively Impaired Barriers to Discharge:  Continued Medical Work up   Dollar GeneralElizabeth M Makana Rostad, LCSW 04/04/2017, 1:24 PM

## 2017-04-04 NOTE — Progress Notes (Signed)
Nutrition Brief Note  Patient identified on the Malnutrition Screening Tool (MST) Report  Wt Readings from Last 15 Encounters:  04/03/17 264 lb 1.8 oz (119.8 kg)  10/17/16 (!) 305 lb (138.3 kg)  09/19/16 (!) 305 lb (138.3 kg)  08/22/16 267 lb 9.6 oz (121.4 kg)  07/25/16 (!) 302 lb (137 kg)  06/13/16 280 lb (127 kg)  06/11/16 280 lb (127 kg)  06/07/16 297 lb (134.7 kg)  05/25/16 280 lb (127 kg)  05/20/16 285 lb (129.3 kg)  05/16/16 280 lb (127 kg)  04/12/16 258 lb (117 kg)  04/03/16 258 lb (117 kg)  03/18/16 258 lb 12.8 oz (117.4 kg)  02/08/16 299 lb (135.6 kg)    Body mass index is 39 kg/m. Patient meets criteria for class II obesity based on current BMI. Weight has been fluctuating.   Current diet order is heart. Pt reports eating well currently and PTA with no other difficulties. Pt currently has Ensure ordered BID and has been consuming them and would like to continue with them. Labs and medications reviewed.   No further nutrition interventions warranted at this time. If nutrition issues arise, please consult RD.   Roslyn SmilingStephanie Akshar Starnes, MS, RD, LDN Pager # [phone] After hours/ weekend pager # [phone]

## 2017-04-04 NOTE — Discharge Summary (Signed)
Family [facility]  Patient name: [name] Medical record number: [id number] Date of birth: 01/15/1966 Age: 51 y.o. Gender: male Date of Admission: 04/02/2017  Date of Discharge: 04/05/17 Admitting Physician: Leighton Roachodd D McDiarmid, MD  Primary Care Provider: Renne MuscaWarden, Daniel L, MD Consultants: None  Indication for Hospitalization: AMS  Discharge Diagnoses/Problem List:  Patient Active Problem List   Diagnosis Date Noted  . Altered mental state 04/03/2017  . Lives in long-term care facility 04/03/2017  . Hypotension 04/03/2017  . CVA, old, right hemiparesis (HCC) 04/03/2017  . Lymphedema of both lower extremities 04/03/2017  . AKI (acute kidney injury) (HCC) 04/03/2017  . Possible Catheter-associated urinary tract infection (HCC) 04/03/2017  . Contracture of elbow joint, right 04/03/2017  . Seborrhea capitis 04/03/2017  . Hypovolemia 04/03/2017  . Dehydration   . Right sided weakness 06/12/2016  . Frequent falls 06/12/2016  . Narcolepsy 06/07/2016  . Sepsis (HCC), Possible   . Morbid obesity (HCC) 03/09/2016  . Noncompliance with medication regimen 08/21/2015  . Homelessness 04/24/2015  . Frozen shoulder 04/24/2015  . TIA (transient ischemic attack) 04/05/2014  . Chronic acquired lymphedema 08/08/2012  . Obstructive sleep apnea 06/08/2009  . Dyslipidemia 01/17/2009  . GERD 12/08/2008  . ERECTILE DYSFUNCTION, ORGANIC 12/08/2008  . Essential hypertension 07/16/2007   Disposition: SNF ([facility])  Discharge Condition: Stable  Discharge Exam:  General: Sitting up in bed in NAD Eyes: PERRLA, EOMI HEENT: MMM CV: RRR, no murmurs appreciated Pulm: CTAB, normal WOB on [place] Abd: Soft, NTND, +BS Neuro: Alert to person, place, and season (autumn) Psych: Appropriate mood and affect  Brief Hospital Course:  Patient presented from SNF with altered mental status. Had been evaluated in ED for some issue one week prior; CT head neg  at that time. Patient was mildly hypotensive on admission, but was afebrile and did not meet sepsis criteria. Patient has chronic-indwelling urinary catheter, so UA showed moderate leuks and TNTC WBCs but no bacteria. Blood and urine cultures were obtained, and he was subsequently started on vanc and Zosyn. Also noted to have AKI with Cr 1.96 (baseline 0.8). FPTS was called for admission for further management.  Given chronic catheter and no other obvious source of infection, patient's AMS was thought likely 2/2 CAUTI. Mental status began to improve after beginning abx, and urine cultures returned with Proteus. Patient was subsequently transitioned from IV abx to Augmentin for Proteus coverage. With IVF, patient's Cr improved as well, and had improved to 1.01 by time of discharge.  As patient continued to improve after transition to Augmentin, he was felt stable for discharge back to SNF.   Issues for Follow Up:  1. Discharged with 8 additional days of Augmentin to complete 10d course of antibiotics.  2. Patient hypotension on admission and during admission, so antihypertensives (Coreg, lisinopril) were held and were not resumed on admission. Recommend monitoring BP at follow-up and resuming anti-hypertensives as necessary.  3. K slightly low at time of discharge (3.3). Administered potassium supplementation prior to discharge. Recommend rechecking BMP at hosp f/u to make sure K has normalized.   Significant Procedures: None  Significant Labs and Imaging:  Recent Labs  Lab 04/02/17 2226 04/03/17 0701 04/04/17 0257  WBC 9.0 9.3 9.3  HGB 14.2 12.8* 11.7*  HCT 40.9 37.4* 34.6*  PLT 302 319 327   Recent Labs  Lab 04/02/17 2226 04/03/17 0701 04/04/17 0257 04/05/17 0241  NA 138  --  139 138  K 3.9  --  3.4* 3.3*  CL 102  --  108 107  CO2 23  --  24 24  GLUCOSE 104*  --  91 109*  BUN 28*  --  23* 17  CREATININE 1.96* 1.69* 1.28* 1.01  CALCIUM 9.0  --  8.4* 8.3*  ALKPHOS 148*  --   --    --   AST 28  --   --   --   ALT 41  --   --   --   ALBUMIN 3.3*  --   --   --     Urine cx - Proteus Blood cx - NGx2d  Results/Tests Pending at Time of Discharge: Blood culture  Discharge Medications:  Allergies as of 04/05/2017   No Known Allergies     Medication List    STOP taking these medications   carvedilol 6.25 MG tablet Commonly known as:  COREG   lisinopril 5 MG tablet Commonly known as:  PRINIVIL,ZESTRIL     TAKE these medications   amoxicillin-clavulanate 875-125 MG tablet Commonly known as:  AUGMENTIN Take 1 tablet by mouth every 12 (twelve) hours.   aspirin EC 81 MG tablet Take 81 mg daily by mouth.   atorvastatin 10 MG tablet Commonly known as:  LIPITOR Take 10 mg at bedtime by mouth.   cetirizine 10 MG tablet Commonly known as:  ZYRTEC Take 10 mg daily by mouth.   DECUBI-VITE Caps Take 1 capsule by mouth daily.   docusate sodium 100 MG capsule Commonly known as:  COLACE Take 100 mg by mouth daily.   FLONASE ALLERGY RELIEF 50 MCG/ACT nasal spray Generic drug:  fluticasone Place 1 spray daily into both nostrils.   levothyroxine 50 MCG tablet Commonly known as:  SYNTHROID, LEVOTHROID Take 50 mcg daily before breakfast by mouth.   meclizine 25 MG tablet Commonly known as:  ANTIVERT Take 25 mg by mouth 3 (three) times daily as needed for dizziness.   polyethylene glycol packet Commonly known as:  MIRALAX / GLYCOLAX Take 17 g by mouth daily as needed for mild constipation.   tamsulosin 0.4 MG Caps capsule Commonly known as:  FLOMAX Take 0.4 mg by mouth daily.   torsemide 20 MG tablet Commonly known as:  DEMADEX Take 1 tablet (20 mg total) by mouth 2 (two) times daily. What changed:    how much to take  when to take this       Discharge Instructions: Please refer to Patient Instructions section of EMR for full details.  Patient was counseled important signs and symptoms that should prompt return to medical care, changes in  medications, dietary instructions, activity restrictions, and follow up appointments.   Follow-Up Appointments:  Contact information for follow-up providers    Renne MuscaWarden, Daniel L, MD. Go on 04/14/2017.   Specialty:  Family Medicine Why:  At 10:20 AM Contact information: [address] [place] [address] [phone]            Contact information for after-discharge care    Destination    [facility] SN[facility] Follow up.   Service:  Skilled Nursing Contact information: [address] [place] [id number] [phone]                  Marquette SaaLancaster, Annslee Tercero Joseph, MD 04/05/2017, 7:23 AM PGY-3, [facility]

## 2017-04-04 NOTE — [place] FL2 (Signed)
[place] MEDICAID FL2 LEVEL OF CARE SCREENING TOOL     IDENTIFICATION  Patient Name: [name] Birthdate: 09/13/1965 Sex: male Admission Date (Current Location): 04/02/2017  [facility] [place]   Facility and Address:  [place], [address], [place], [place] [address]      Provider Number: [id number]  Attending Physician Name and Address:  McDiarmid, Leighton Roachodd D, MD  Relative Name and Phone Number:       Current Level of Care: Hospital Recommended Level of Care: Skilled Nursing Facility Prior Approval Number:    Date Approved/Denied:   PASRR Number: [id number]  Discharge Plan: SNF    Current Diagnoses: Patient Active Problem List   Diagnosis Date Noted  . Altered mental state 04/03/2017  . Lives in long-term care facility 04/03/2017  . Hypotension 04/03/2017  . CVA, old, right hemiparesis (HCC) 04/03/2017  . Lymphedema of both lower extremities 04/03/2017  . AKI (acute kidney injury) (HCC) 04/03/2017  . Possible Catheter-associated urinary tract infection (HCC) 04/03/2017  . Contracture of elbow joint, right 04/03/2017  . Seborrhea capitis 04/03/2017  . Hypovolemia 04/03/2017  . Dehydration   . Right sided weakness 06/12/2016  . Frequent falls 06/12/2016  . Narcolepsy 06/07/2016  . Sepsis (HCC), Possible   . Morbid obesity (HCC) 03/09/2016  . Noncompliance with medication regimen 08/21/2015  . Homelessness 04/24/2015  . Frozen shoulder 04/24/2015  . TIA (transient ischemic attack) 04/05/2014  . Chronic acquired lymphedema 08/08/2012  . Obstructive sleep apnea 06/08/2009  . Dyslipidemia 01/17/2009  . GERD 12/08/2008  . ERECTILE DYSFUNCTION, ORGANIC 12/08/2008  . Essential hypertension 07/16/2007    Orientation RESPIRATION BLADDER Height & Weight     Self, Place  Normal Incontinent, External catheter(catheter placed 04/03/17; chronic catheter use) Weight: 264 lb 1.8 oz (119.8 kg) Height:  5\' 9"   (175.3 cm)  BEHAVIORAL SYMPTOMS/MOOD NEUROLOGICAL BOWEL NUTRITION STATUS      Incontinent Diet(heart healthy)  AMBULATORY STATUS COMMUNICATION OF NEEDS Skin   Extensive Assist Verbally Other (Comment)(lymphedema, right and left buttocks with foam dressing)                       Personal Care Assistance Level of Assistance  Bathing, Feeding, Dressing Bathing Assistance: Maximum assistance Feeding assistance: Limited assistance Dressing Assistance: Maximum assistance     Functional Limitations Info  Sight, Hearing, Speech Sight Info: Adequate Hearing Info: Adequate Speech Info: Impaired(slurred/dysarthria)    SPECIAL CARE FACTORS FREQUENCY                       Contractures Contractures Info: Present(contracture right elbow joint, frozen shoulder)    Additional Factors Info  Code Status, Allergies Code Status Info: DNR Allergies Info: NKA           Current Medications (04/04/2017):  This is the current hospital active medication list Current Facility-Administered Medications  Medication Dose Route Frequency Provider Last Rate Last Dose  . amoxicillin-clavulanate (AUGMENTIN) 875-125 MG per tablet 1 tablet  1 tablet Oral Q12H Marquette SaaLancaster, Abigail Joseph, MD      . aspirin EC tablet 81 mg  81 mg Oral Daily Renne MuscaWarden, Daniel L, MD   81 mg at 04/04/17 0926  . atorvastatin (LIPITOR) tablet 10 mg  10 mg Oral QHS Renne MuscaWarden, Daniel L, MD   10 mg at 04/03/17 2123  . clotrimazole (LOTRIMIN) 1 % cream   Topical BID McDiarmid, Leighton Roachodd D, MD      .  dextrose 5 %-0.9 % sodium chloride infusion   Intravenous Continuous Renne MuscaWarden, Daniel L, MD 125 mL/hr at 04/03/17 2134    . docusate sodium (COLACE) capsule 100 mg  100 mg Oral Daily Renne MuscaWarden, Daniel L, MD   100 mg at 04/04/17 [id number]  . feeding supplement (ENSURE ENLIVE) (ENSURE ENLIVE) liquid 237 mL  237 mL Oral BID BM McDiarmid, Leighton Roachodd D, MD   237 mL at 04/04/17 0926  . fluticasone (FLONASE) 50 MCG/ACT nasal spray 1 spray  1 spray Each Nare Daily  Renne MuscaWarden, Daniel L, MD   1 spray at 04/04/17 0926  . heparin injection 5,000 Units  5,000 Units Subcutaneous Q8H Renne MuscaWarden, Daniel L, MD   5,000 Units at 04/04/17 0523  . levothyroxine (SYNTHROID, LEVOTHROID) tablet 50 mcg  50 mcg Oral QAC breakfast Renne MuscaWarden, Daniel L, MD   50 mcg at 04/04/17 0926  . loratadine (CLARITIN) tablet 10 mg  10 mg Oral Daily Renne MuscaWarden, Daniel L, MD   10 mg at 04/04/17 0926  . polyethylene glycol (MIRALAX / GLYCOLAX) packet 17 g  17 g Oral Daily PRN Renne MuscaWarden, Daniel L, MD         Discharge Medications: Please see discharge summary for a list of discharge medications.  Relevant Imaging Results:  Relevant Lab Results:   Additional Information SS#: [id number]  Baldemar LenisElizabeth M Jermichael Belmares, LCSW

## 2017-04-04 NOTE — Progress Notes (Signed)
Pharmacy Antibiotic Note  [name] is a 51 y.o. male admitted on 04/02/2017 with AMS.  Pharmacy has been consulted for vancomycin and zosyn dosing for sepsis. Blood cultures have no growth at one day, urine is growing proteus (60K colonies), WBC 9, Lactic acid 1.2, was hypotensive and febrile on admission.  His renal function has improved with fluids since admission (Scr 1.96>1.28), so the vancomycin dose was adjusted accordingly.   Plan: Vancomycin 750mg  Q12hrs Zosyn 3.375g Q8hrs Monitor cultures, LOT, renal function Vanc trough as clinically indicated with a goal of 15-20   Height: 5\' 9"  (175.3 cm) Weight: 264 lb 1.8 oz (119.8 kg) IBW/kg (Calculated) : 70.7  Temp (24hrs), Avg:98.8 F (37.1 C), Min:98.4 F (36.9 C), Max:99.4 F (37.4 C)  Recent Labs  Lab 03/28/17 1315 03/28/17 1342 04/02/17 2226 04/02/17 2239 04/03/17 0701 04/04/17 0257  WBC  --  5.3 9.0  --  9.3 9.3  CREATININE 1.46*  --  1.96*  --  1.69* 1.28*  LATICACIDVEN  --   --   --  1.21  --   --     Estimated Creatinine Clearance: 87.2 mL/min (A) (by C-G formula based on SCr of 1.28 mg/dL (H)).    Vanc 11/22>> Zosyn 11/22>>  Dose adjustments: -11/23 Changed the vanc from 1250 Q24h to 750 Q12h with improved renal fx  Blood 11/21>>ngtd Urine 11/22>>proteus (60,000 colonies)  Thank you for allowing pharmacy to be a part of this patient's care.  Nolen MuAustin J Lucas PharmD PGY1 Pharmacy Practice Resident 04/04/2017 12:39 PM Phone: [phone]

## 2017-04-04 NOTE — Progress Notes (Signed)
Family Medicine Teaching Service Progress Note Service Pager: [phone]  Patient name: [name] Medical record number: [id number] Date of birth: 05/03/1966 Age: 51 y.o. Gender: male  Primary Care Provider: Renne MuscaWarden, Daniel L, MD Consultants: none Code Status: DNR  Chief Complaint: AMS  Assessment and Plan: Joshua ClampRonald E Mccormick is a 51 y.o. male presenting from SNF with altered mental status and subsequent development of fever of unknown origin. PMH is significant for HTN, chronic acquired lymphedema, morbid obesity, OSA, GERD, hx TIA, chronic indwelling catheter  AMS with FUO Presenting from SNF and reportedly had hypotension responsive to bolus. With questionable AMS x10 days. He is not currently taking any medications to explain altered mental status.  Several hours after admission patient was noted to have elevated temperature to 100.6, but was not febrile at presentation. Does have chronic indwelling catheter and UA showed moderate leukocytes and too numerous to count white blood cells, but no bacteria. Blood and urine cultures were drawn prior to starting broad-spectrum antibiotics, vancomycin and Zosyn. Urine culture with Proteus. Blood cx NGTD. Remains afebrile >24hrs.  -Transition to PO Augmentin today to complete 10d course -F/u bcx -Trend fever curve -Cont monitor neuro status  AKI Cr 1.96 on admission. Baseline ~0.8 in February 2018. Creatinine was elevated to 1.46 last week. Takes lisinopril daily, no other nephrotoxic medications. Likely due to presumed poor PO intake prior to admission given apparent mild dehydration on admission. Improving with IVF. Cr  1.28 today (11/23).  -Continue to trend creatinine -Holding nephrotoxic medications -Continue with Foley catheter -Cont to hold lisinopril given AKI and persistent hypotension  Chronic lymphedema Appears to be stable -TED hose  Hypotension with history of hypertension Hypotensive to 81/54 upon presentation.  Improved to 111/52 after two 1L boluses. Appeared volume depleted on initial exam, with dry MM and tachycardia. Taking Coreg 6.25 mg twice daily, lisinopril 5 mg daily and Flomax 0.4 mg daily. Remains hypotensive with BPs ~115/50s overnight. -Holding antihypertensives  Hyperlipidemia Currently on Lipitor 10 mg daily.  Has not had a lipid panel since 2016. -Lipid panel  Hypothyroidism Stable. Most recent TSH 1.2.  -Cont levothyroxine 50ug daily  FEN/GI: Heart healthy Prophylaxis: subcutaneous heparin  Disposition: Back to SNF pending medical improvement  Subjective:  Patient with no complaints this AM. Says he is feeling well. No acute events overnight.    Objective:  BP 109/62 (BP Location: Right Arm)   Pulse (!) 55   Temp 98.9 F (37.2 C) (Oral)   Resp 18   Ht 5\' 9"  (1.753 m)   Wt 264 lb 1.8 oz (119.8 kg)   SpO2 100%   BMI 39.00 kg/m  Exam: General: Sitting up in bed in NAD Cardiovascular: Regular rate and rhythm, S1-S2 present, no murmurs appreciated Respiratory: CTAB, no wheezes, normal WOB on RA Gastrointestinal: Soft, nontender nondistended, +BS MSK: No LE edema Derm: Chronic lower extremity changes GU: Foley catheter in place Neuro: alert to person, place, and month (not year) Psych: appropriate mood and affect  Labs and Imaging: CBC BMET  Recent Labs  Lab 04/04/17 0257  WBC 9.3  HGB 11.7*  HCT 34.6*  PLT 327   Recent Labs  Lab 04/04/17 0257  NA 139  K 3.4*  CL 108  CO2 24  BUN 23*  CREATININE 1.28*  GLUCOSE 91  CALCIUM 8.4*     Dg Chest 2 View  Result Date: 04/02/2017 CLINICAL DATA:  Wheezing and fever. EXAM: CHEST  2 VIEW COMPARISON:  03/14/2016 FINDINGS: The heart size  and mediastinal contours are within normal limits. Low lung volumes with atelectasis at the right lung base. No overt pulmonary edema. Chronic stable broad-based kyphosis attributable to multilevel degenerative disc disease involving the thoracic spine. IMPRESSION: Low lung  volumes with atelectasis at the right lung base. Electronically Signed   By: Tollie Ethavid  Kwon M.D.   On: 04/02/2017 23:04    Marquette SaaLancaster, Donye Dauenhauer Joseph, MD 04/04/2017, 12:51 PM PGY-3, [facility] FPTS Intern pager: [phone], text pages welcome

## 2017-04-05 LAB — URINE CULTURE

## 2017-04-05 LAB — BASIC METABOLIC PANEL
ANION GAP: 7 (ref 5–15)
BUN: 17 mg/dL (ref 6–20)
CALCIUM: 8.3 mg/dL — AB (ref 8.9–10.3)
CO2: 24 mmol/L (ref 22–32)
Chloride: 107 mmol/L (ref 101–111)
Creatinine, Ser: 1.01 mg/dL (ref 0.61–1.24)
GFR calc Af Amer: >60 mL/min (ref >60)
GLUCOSE: 109 mg/dL — AB (ref 65–99)
POTASSIUM: 3.3 mmol/L — AB (ref 3.5–5.1)
SODIUM: 138 mmol/L (ref 135–145)

## 2017-04-05 MED ORDER — AMOXICILLIN-POT CLAVULANATE 875-125 MG PO TABS: 1.0000 | ORAL_TABLET | Freq: Two times a day (BID) | ORAL | 0 refills | Status: DC

## 2017-04-05 MED ORDER — POTASSIUM CHLORIDE CRYS ER 20 MEQ PO TBCR
40.0000 meq | EXTENDED_RELEASE_TABLET | Freq: Once | ORAL | Status: AC
  Administered 2017-04-05: 40 meq via ORAL
  Filled 2017-04-05: qty 2

## 2017-04-05 MED ORDER — POTASSIUM CHLORIDE 10 MEQ/100ML IV SOLN
10.0000 meq | INTRAVENOUS | Status: DC
  Administered 2017-04-05: 10 meq via INTRAVENOUS
  Filled 2017-04-05 (×3): qty 100

## 2017-04-05 MED ORDER — AMOXICILLIN-POT CLAVULANATE 875-125 MG PO TABS: 1.0000 | ORAL_TABLET | Freq: Two times a day (BID) | ORAL | 0 refills | Status: AC

## 2017-04-05 NOTE — Progress Notes (Addendum)
[name] was informed that pt is able to return [facility] today. [name] has placed all needed documents on the chart as well as sent then through the hub. At this time [name] has call ed for [facility]. [name] attempted to update pt's father but no answer at this time and no VM able to be left. There are no further [name] needs. [name] signing off.       Claude MangesKierra S. Loron Weimer, MSW, LCSW-A Emergency Department Clinical Social Worker [phone]

## 2017-04-05 NOTE — Progress Notes (Signed)
Pt. Discharge [facility].  Transported by [facility]. All discharge papers sent with transport.  Patient has no belonging in the room.

## 2017-04-05 NOTE — Discharge Instructions (Signed)
Continue taking your antibiotic (Augmentin) twice a day for the next 8 days.   STOP taking carvedilol (Coreg) and lisinopril for blood pressure.   Please be sure to go to your hospital follow-up appointment with Dr. Myrtie SomanWarden on Monday, December 3 at 10:20 AM at the [facility].

## 2017-04-07 LAB — CULTURE, BLOOD (ROUTINE X 2)
CULTURE: NO GROWTH
CULTURE: NO GROWTH

## 2017-04-14 ENCOUNTER — Ambulatory Visit: Payer: Medicaid Other | Admitting: Family Medicine

## 2017-04-28 IMAGING — CT CT HEAD W/O CM
2 series · 16 of 30 positions shown, 20 images · non-contrast
Comparison: None.

CLINICAL DATA: Altered mental status. Syncope. Fainting spells.
Patient is a pleasant homeless 48-year-old male presenting with
desired have an a nice cool place to rest. Patient presented to EMS
and said that he was having "falling out spells" now states that he
does not have any symptoms and just wishes to have "a nice cool
place to rest. " Patient appears intoxicated. Has a Band-Aid over
his right head with an abrasion that appears old. Pt currently
altered.

EXAM:
CT HEAD WITHOUT CONTRAST
TECHNIQUE: Contiguous axial images were obtained from the base of the skull
through the vertex without intravenous contrast.

[Series 2: head w/o · axial · non-contrast · 0.45mm/px · z∈[+1576,+1692]mm · 13 of 27 slices shown, 17 images]
[im 2/27  brain]
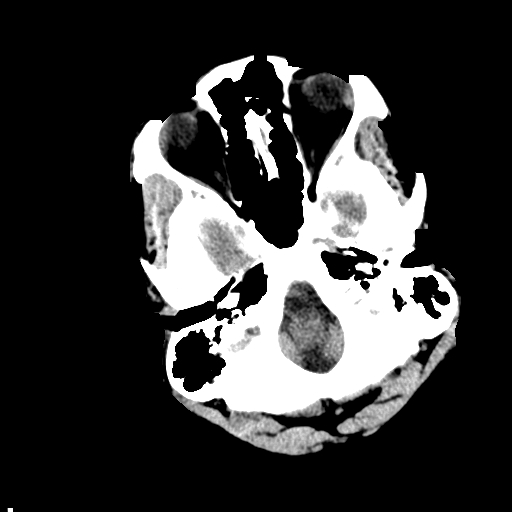
[im 2/27  bone]
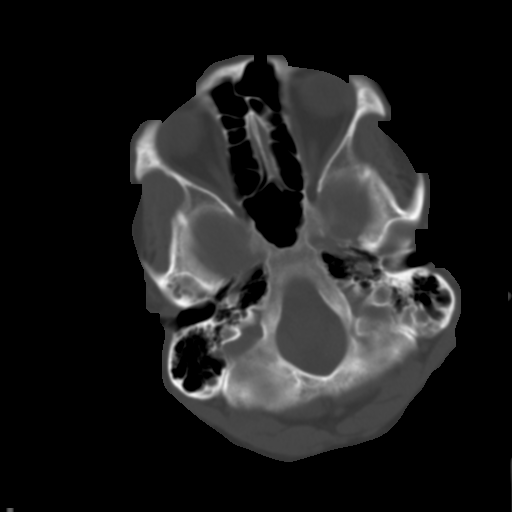
[im 4/27  brain]
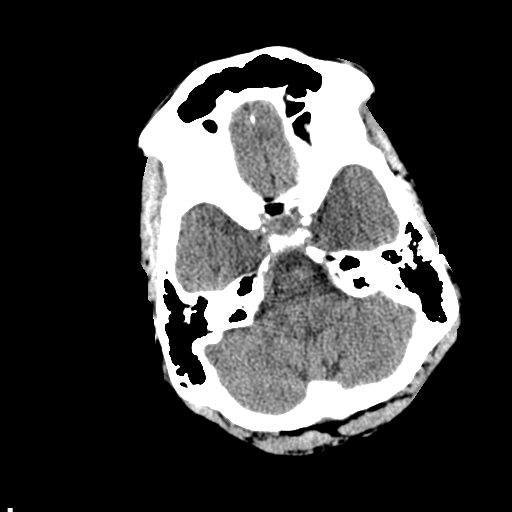
[im 6/27  brain]
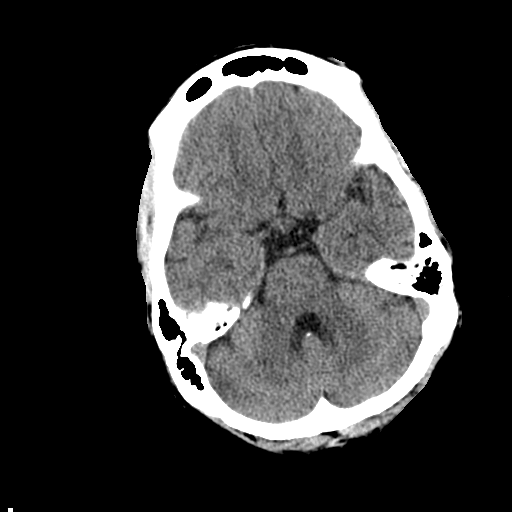
[im 8/27  brain]
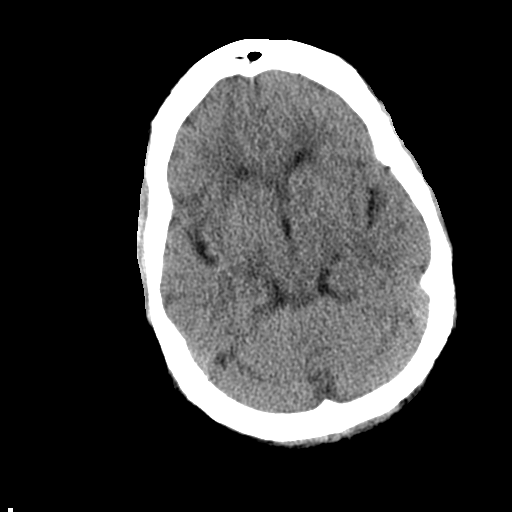
[im 10/27  brain]
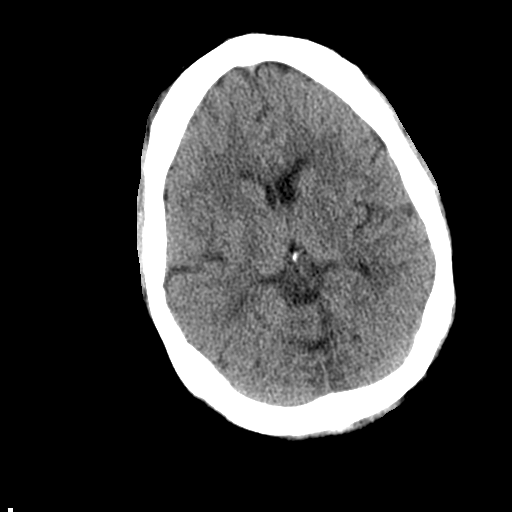
[im 10/27  bone]
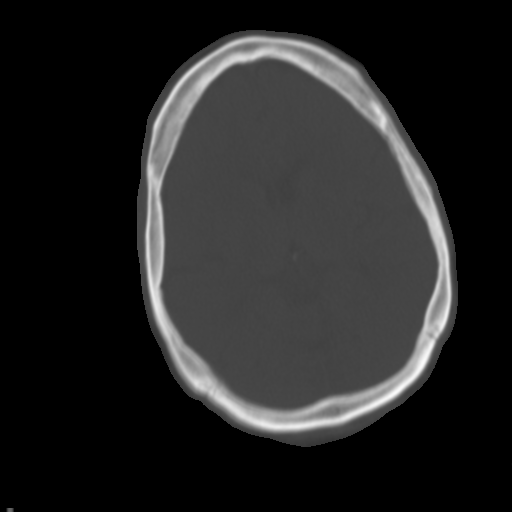
[im 12/27  brain]
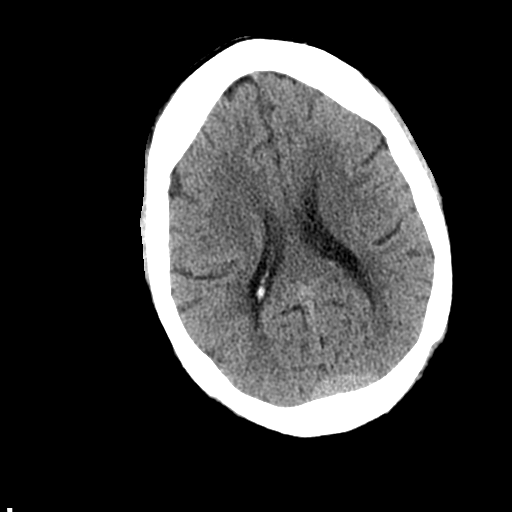
[im 14/27  brain]
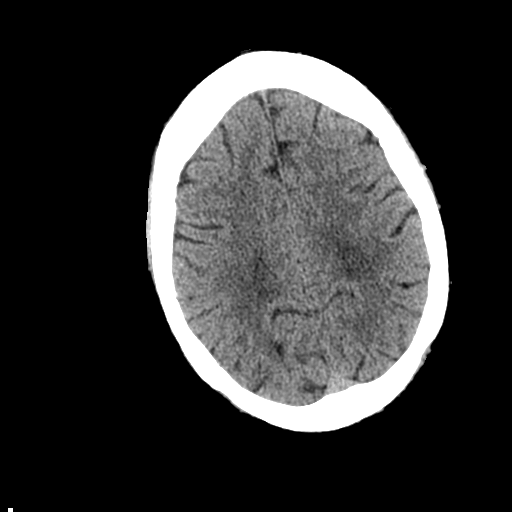
[im 15/27  brain]
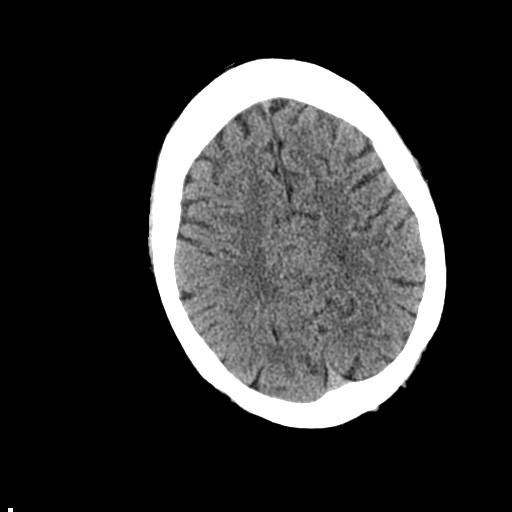
[im 17/27  brain]
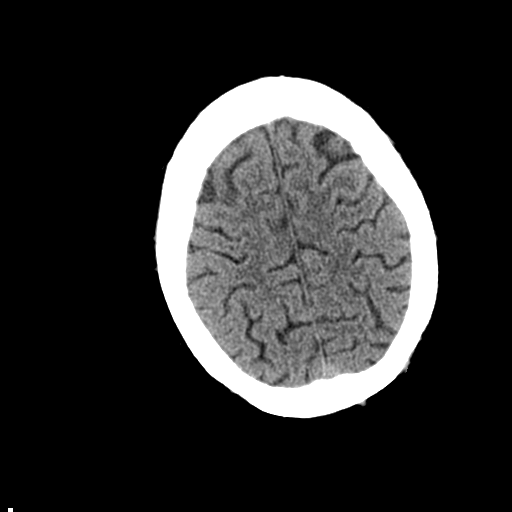
[im 17/27  bone]
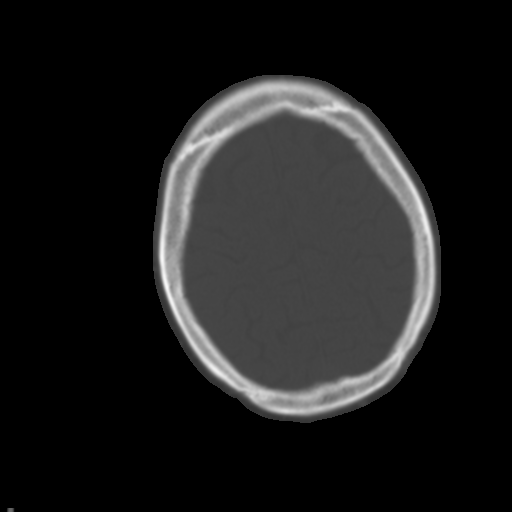
[im 19/27  brain]
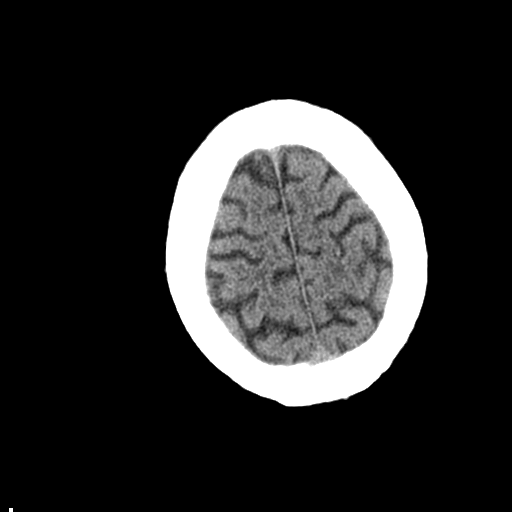
[im 21/27  brain]
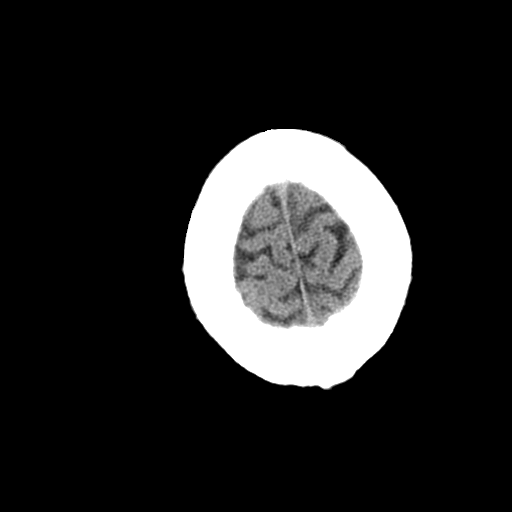
[im 23/27  brain]
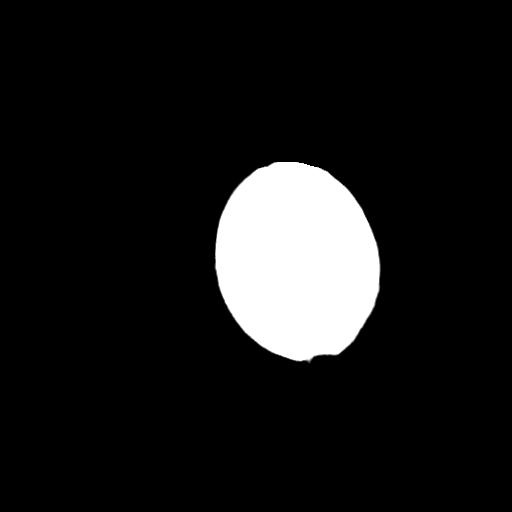
[im 25/27  brain]
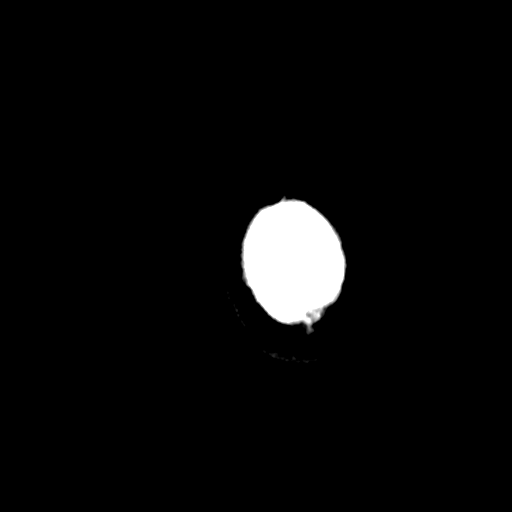
[im 25/27  bone]
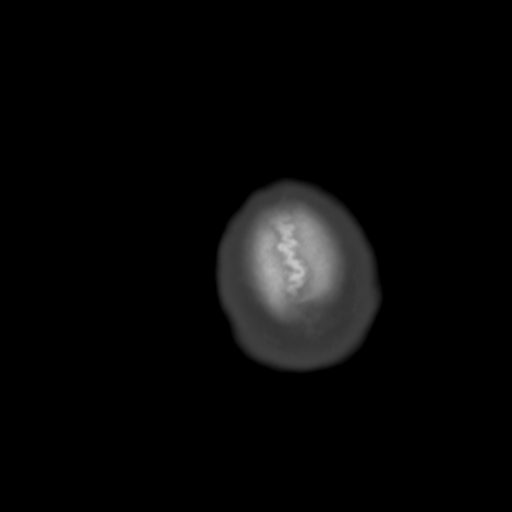

[Series 3: bone windows · axial · 0.45mm/px · z∈[+1576,+1616]mm · 3 of 27 slices shown]
[im 2/27  bone]
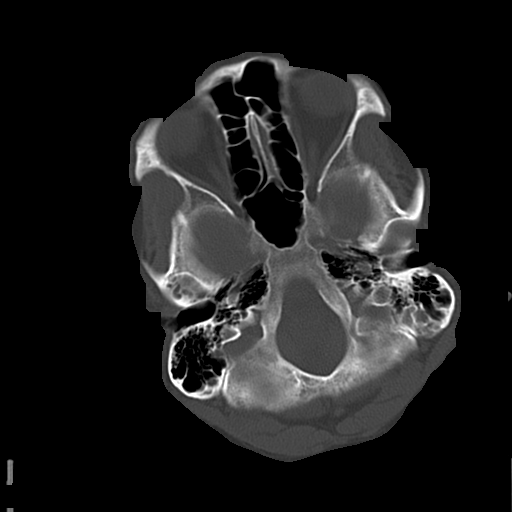
[im 6/27  bone]
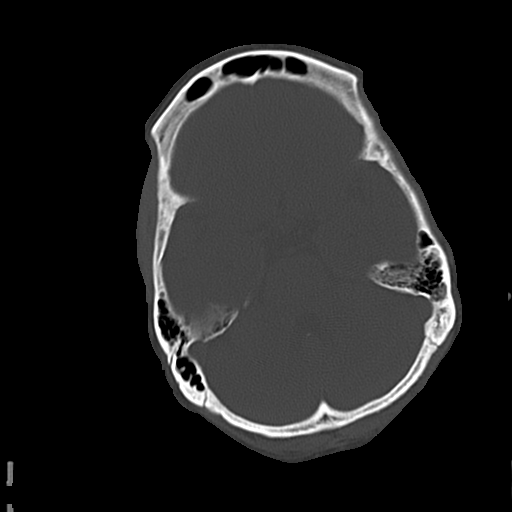
[im 10/27  bone]
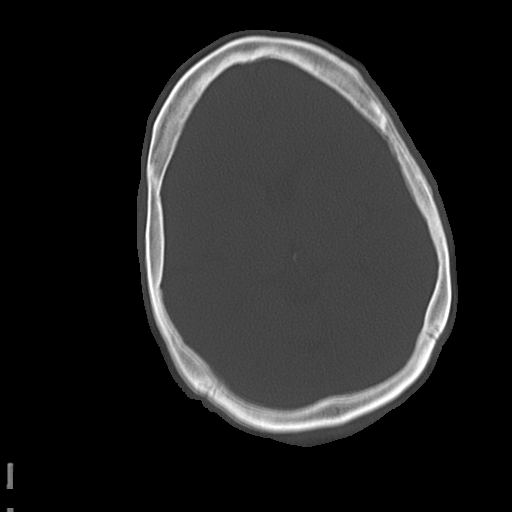

[16 of 30 positions shown; findings below may reference images not displayed]

FINDINGS: No mass lesion, mass effect, midline shift, hydrocephalus,
hemorrhage. No territorial ischemia or acute infarction. Calvarium
intact. Visible paranasal sinuses are normal.
IMPRESSION: Negative CT head.

## 2017-05-01 ENCOUNTER — Ambulatory Visit (HOSPITAL_BASED_OUTPATIENT_CLINIC_OR_DEPARTMENT_OTHER): Payer: Medicaid Other | Attending: Internal Medicine | Admitting: Internal Medicine

## 2017-05-01 VITALS — Ht 69.0 in | Wt 276.0 lb

## 2017-05-01 DIAGNOSIS — R5383 Other fatigue: Secondary | ICD-10-CM

## 2017-05-01 DIAGNOSIS — G4733 Obstructive sleep apnea (adult) (pediatric): Secondary | ICD-10-CM | POA: Diagnosis not present

## 2017-05-01 DIAGNOSIS — F5101 Primary insomnia: Secondary | ICD-10-CM

## 2017-05-01 DIAGNOSIS — G4736 Sleep related hypoventilation in conditions classified elsewhere: Secondary | ICD-10-CM | POA: Insufficient documentation

## 2017-05-12 DIAGNOSIS — G4733 Obstructive sleep apnea (adult) (pediatric): Secondary | ICD-10-CM | POA: Diagnosis not present

## 2017-05-12 DIAGNOSIS — G4736 Sleep related hypoventilation in conditions classified elsewhere: Secondary | ICD-10-CM | POA: Diagnosis not present

## 2017-05-16 NOTE — Procedures (Signed)
Patient Name: [name], [name] Study Date: 05/01/2017 Gender: Male D.O.B: Apr 15, 1966 Age (years): [age] Referring Provider: Loney LohWhitney Clendenin NP Height (inches): 69 Interpreting Physician: Jetty Duhamellinton Elice Crigger MD, ABSM Weight (lbs): 276 RPSGT: Lise AuerGibson,RPSGT, Theresa BMI: 41 MRN: [id number] Neck Size: 16.00 <br> <br> CLINICAL INFORMATION Sleep Study Type: NPSG  Indication for sleep study: Fatigue, Obesity  Epworth Sleepiness Score: 15  SLEEP STUDY TECHNIQUE As per the AASM Manual for the Scoring of Sleep and Associated Events v2.3 (April 2016) with a hypopnea requiring 4% desaturations.  The channels recorded and monitored were frontal, central and occipital EEG, electrooculogram (EOG), submentalis EMG (chin), nasal and oral airflow, thoracic and abdominal wall motion, anterior tibialis EMG, snore microphone, electrocardiogram, and pulse oximetry.  MEDICATIONS Medications self-administered by patient taken the night of the study : none reported  SLEEP ARCHITECTURE The study was initiated at 10:08:04 PM and ended at 5:04:21 AM.  Sleep onset time was 65.6 minutes and the sleep efficiency was 71.2%. The total sleep time was 296.5 minutes.  Stage REM latency was 38.0 minutes.  The patient spent 6.41% of the night in stage N1 sleep, 66.78% in stage N2 sleep, 7.08% in stage N3 and 19.73% in REM.  Alpha intrusion was absent.  Supine sleep was 100.00%.  RESPIRATORY PARAMETERS The overall apnea/hypopnea index (AHI) was 7.3 per hour. There were 16 total apneas, including 1 obstructive, 15 central and 0 mixed apneas. There were 20 hypopneas and 24 RERAs.  The AHI during Stage REM sleep was 11.3 per hour.  AHI while supine was 7.3 per hour.  The mean oxygen saturation was 89.69%. The minimum SpO2 during sleep was 79.00%.  soft snoring was noted during this study.  CARDIAC DATA The 2 lead EKG demonstrated sinus rhythm. The mean heart rate was 74.82 beats per minute. Other EKG  findings include: None.  LEG MOVEMENT DATA The total PLMS were 665 with a resulting PLMS index of 134.57. Associated arousal with leg movement index was 3.6 .  IMPRESSIONS - Mild obstructive sleep apnea occurred during this study (AHI = 7.3/h). - There were insufficient events to meet protocol requirements for split CPAP titration. During pretest CPAP introduction for fitting, patient indicated claustrophobia and refused to wear CPAP/ - No significant central sleep apnea occurred during this study (CAI = 3.0/h). - Moderate oxygen desaturation was noted during this study (Min O2 = 79.00%, Mean 89.69%). - The patient snored with soft snoring volume. - No cardiac abnormalities were noted during this study. - Severe periodic limb movements of sleep occurred during the study. No significant associated arousals.  DIAGNOSIS - Obstructive Sleep Apnea (327.23 [G47.33 ICD-10]) - Nocturnal Hypoxemia (327.26 [G47.36 ICD-10])  RECOMMENDATIONS - Very mild obstructive sleep apnea. Specific therapy may not be indicated.  - Avoid alcohol, sedatives and other CNS depressants that may worsen sleep apnea and disrupt normal sleep architecture. - Address nocturnal hypoxemia. - Sleep hygiene should be reviewed to assess factors that may improve sleep quality. - Weight management and regular exercise should be initiated or continued if appropriate.  [Electronically signed] 05/12/2017 03:58 PM  Jetty Duhamellinton Shaneka Efaw MD, ABSM Diplomate, American Board of Sleep Medicine   NPI: [id number]                          Jetty Duhamellinton Kayona Foor Diplomate, American Board of Sleep Medicine  ELECTRONICALLY SIGNED ON:  05/16/2017, 2:25 PM [facility] PH: [phone]   FX: [phone] ACCREDITED BY THE AMERICAN ACADEMY OF  SLEEP MEDICINE

## 2017-05-23 ENCOUNTER — Ambulatory Visit: Payer: Medicaid Other | Admitting: Adult Health

## 2017-05-26 ENCOUNTER — Encounter: Payer: Self-pay | Admitting: Adult Health

## 2017-05-26 ENCOUNTER — Ambulatory Visit (INDEPENDENT_AMBULATORY_CARE_PROVIDER_SITE_OTHER): Payer: Medicaid Other | Admitting: Adult Health

## 2017-05-26 VITALS — BP 124/76 | HR 81

## 2017-05-26 DIAGNOSIS — G4734 Idiopathic sleep related nonobstructive alveolar hypoventilation: Secondary | ICD-10-CM | POA: Diagnosis not present

## 2017-05-26 DIAGNOSIS — G4733 Obstructive sleep apnea (adult) (pediatric): Secondary | ICD-10-CM | POA: Diagnosis not present
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
## 2017-05-26 NOTE — Assessment & Plan Note (Addendum)
Very mild OSA - discussed possible treatment options . He declines CPAP .  Will check ONO to check for dests. While sleeping .  Avoid sedatives .   Plan  . Patient Instructions  Set up for an overnight oximetry test to check your oxygen level while you sleep .  If you change your mind about CPAP , contact our office.  Follow up with Dr. Maple HudsonYoung  In 4-6 months and As needed

## 2017-05-26 NOTE — Patient Instructions (Signed)
Set up for an overnight oximetry test to check your oxygen level while you sleep .  If you change your mind about CPAP , contact our office.  Follow up with Dr. Maple HudsonYoung  In 4-6 months and As needed

## 2017-05-26 NOTE — Addendum Note (Signed)
Addended by: Boone MasterJONES, Celest Reitz E on: 05/26/2017 03:24 PM   Modules accepted: Orders

## 2017-07-02 ENCOUNTER — Telehealth: Payer: Self-pay | Admitting: *Deleted

## 2017-07-02 DIAGNOSIS — G4734 Idiopathic sleep related nonobstructive alveolar hypoventilation: Secondary | ICD-10-CM

## 2017-07-02 NOTE — Telephone Encounter (Signed)
Pt last seen 1.14.19 by TP and ONO was ordered Pt with nocturnal hypoxemia and OSA (very mild) Pt declined CPAP so ONO was ordered to check for desats  2.4.19 ONO results received and reviewed by TP: mild desats, may begin O2 at 2lpm at bedtime  Called [facility] where [facility] and spoke with nurse working today Lafe GarinAisha Cole and gave verbal results and order for the new O2 start.  Aisha voiced her understanding and repeated order back to me.  Order placed for documentation purposes ONO sent for scan  Nothing further needed; will sign off

## 2017-10-04 ENCOUNTER — Emergency Department (HOSPITAL_COMMUNITY)
Admission: EM | Admit: 2017-10-04 | Discharge: 2017-10-05 | Disposition: A | Discharge status: Home / Self Care | Payer: Medicaid Other | Attending: Physician Assistant | Admitting: Physician Assistant

## 2017-10-04 ENCOUNTER — Encounter (HOSPITAL_COMMUNITY): Payer: Self-pay

## 2017-10-04 ENCOUNTER — Other Ambulatory Visit: Payer: Self-pay

## 2017-10-04 DIAGNOSIS — N3000 Acute cystitis without hematuria: Secondary | ICD-10-CM | POA: Insufficient documentation

## 2017-10-04 DIAGNOSIS — R300 Dysuria: Secondary | ICD-10-CM | POA: Diagnosis present

## 2017-10-04 LAB — URINALYSIS, ROUTINE W REFLEX MICROSCOPIC
Bilirubin Urine: NEGATIVE
GLUCOSE, UA: NEGATIVE mg/dL
KETONES UR: NEGATIVE mg/dL
NITRITE: POSITIVE — AB
PH: 8 (ref 5.0–8.0)
Protein, ur: 100 mg/dL — AB
SPECIFIC GRAVITY, URINE: 1.008 (ref 1.005–1.030)

## 2017-10-04 MED ORDER — CEPHALEXIN 500 MG PO CAPS: 500.0000 mg | ORAL_CAPSULE | Freq: Four times a day (QID) | ORAL | 0 refills | Status: AC

## 2017-10-04 MED ORDER — CEPHALEXIN 500 MG PO CAPS
500.0000 mg | ORAL_CAPSULE | Freq: Once | ORAL | Status: AC
  Administered 2017-10-04: 500 mg via ORAL
  Filled 2017-10-04: qty 1

## 2017-10-04 NOTE — ED Notes (Signed)
Bed: XL24 Expected date:  Expected time:  Means of arrival:  Comments: EMS 52 yo male from SNF-catheter clogged/urinary retention/leaking

## 2017-10-04 NOTE — Discharge Instructions (Signed)
You have an obvious infection in your urine.  We are worried that it might spread to your kidneys but you do not want lab work from at this time.  We are giving you antibiotics which should help treat the infection, if you get worse, have nausea vomiting, or flank pain, or are amenable to starting labs please return to the emergency department we are happy to continue to work up your infection.

## 2017-10-04 NOTE — ED Notes (Signed)
Called report to [facility] staff.

## 2017-10-04 NOTE — ED Triage Notes (Signed)
States for about 6 hours unable to urinate pt has catheter in place with dark urine noted.
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
## 2017-10-04 NOTE — ED Notes (Signed)
Bladder scan 115 cc.

## 2017-10-07 LAB — URINE CULTURE: Culture: >=100000 — AB

## 2017-10-08 ENCOUNTER — Telehealth: Payer: Self-pay | Admitting: Emergency Medicine

## 2017-10-08 NOTE — Telephone Encounter (Signed)
Post ED Visit - Positive Culture Follow-up  Culture report reviewed by antimicrobial stewardship pharmacist:   Enzo Bi, Pharm.D.  Celedonio Miyamoto, Pharm.D., BCPS AQ-ID  Garvin Fila, Pharm.D., BCPS  Georgina Pillion, [address].D., BCPS  [place], [address].D., BCPS, AAHIVP  Estella Husk, Pharm.D., BCPS, AAHIVP  Lysle Pearl, PharmD, BCPS  Sherlynn Carbon, PharmD  Pollyann Samples, PharmD, BCPS  Positive urine culture Treated with cephalexin, organism sensitive to the same and no further patient follow-up is required at this time.  Berle Mull 10/08/2017, 12:21 PM

## 2017-10-28 IMAGING — CR DG KNEE COMPLETE 4+V*L*
4 series · 4 of 4 positions shown · non-contrast
Comparison: 08/07/2012

CLINICAL DATA: Left knee pain, fall today

EXAM:
LEFT KNEE - COMPLETE 4+ VIEW

[knee ap]
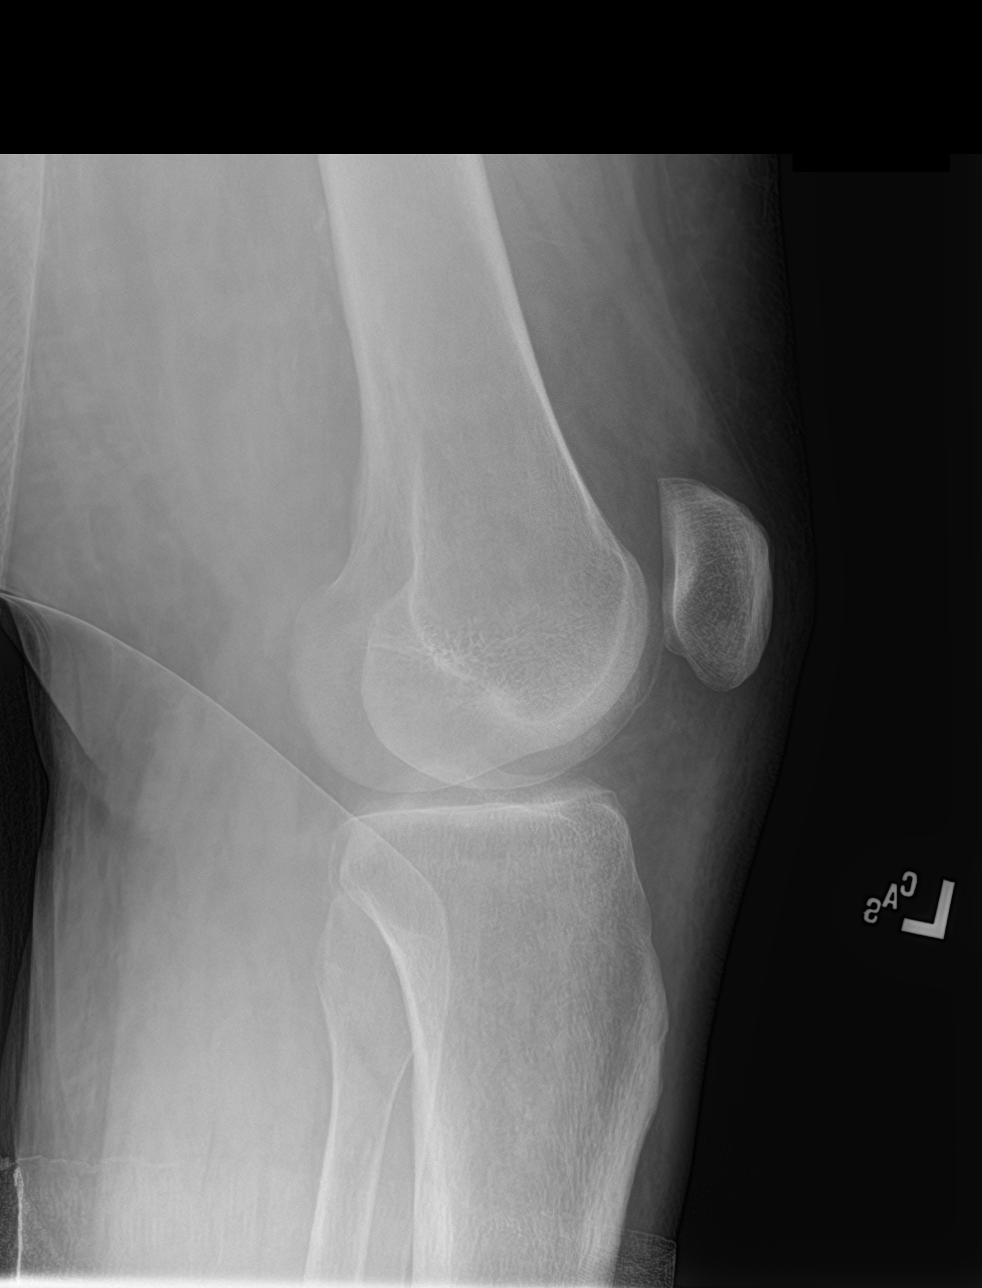

[knee lat]
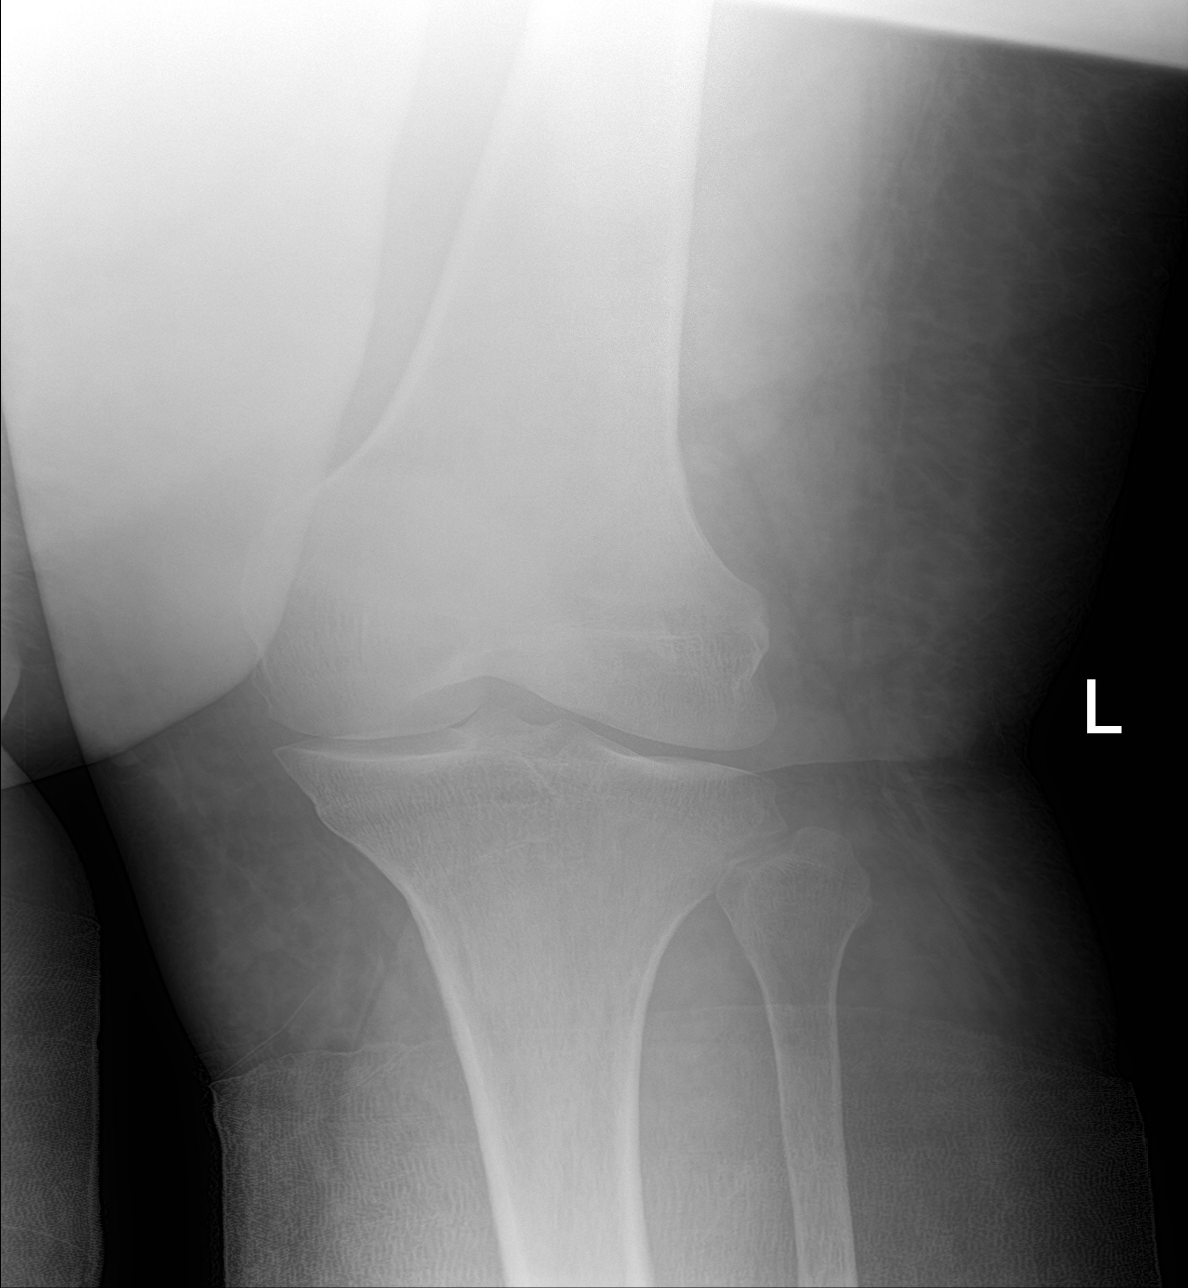

[knee obl (1 of 2)]
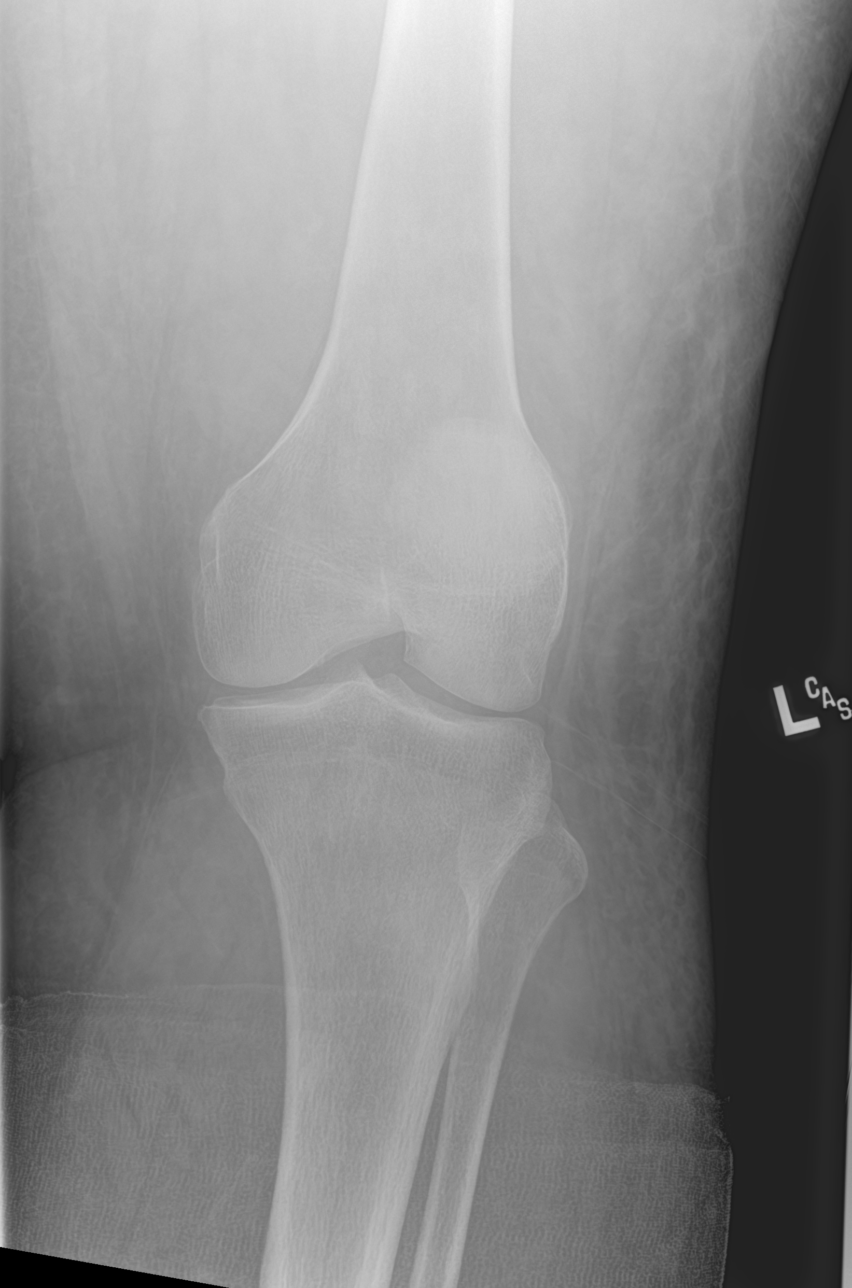

[knee obl (2 of 2)]
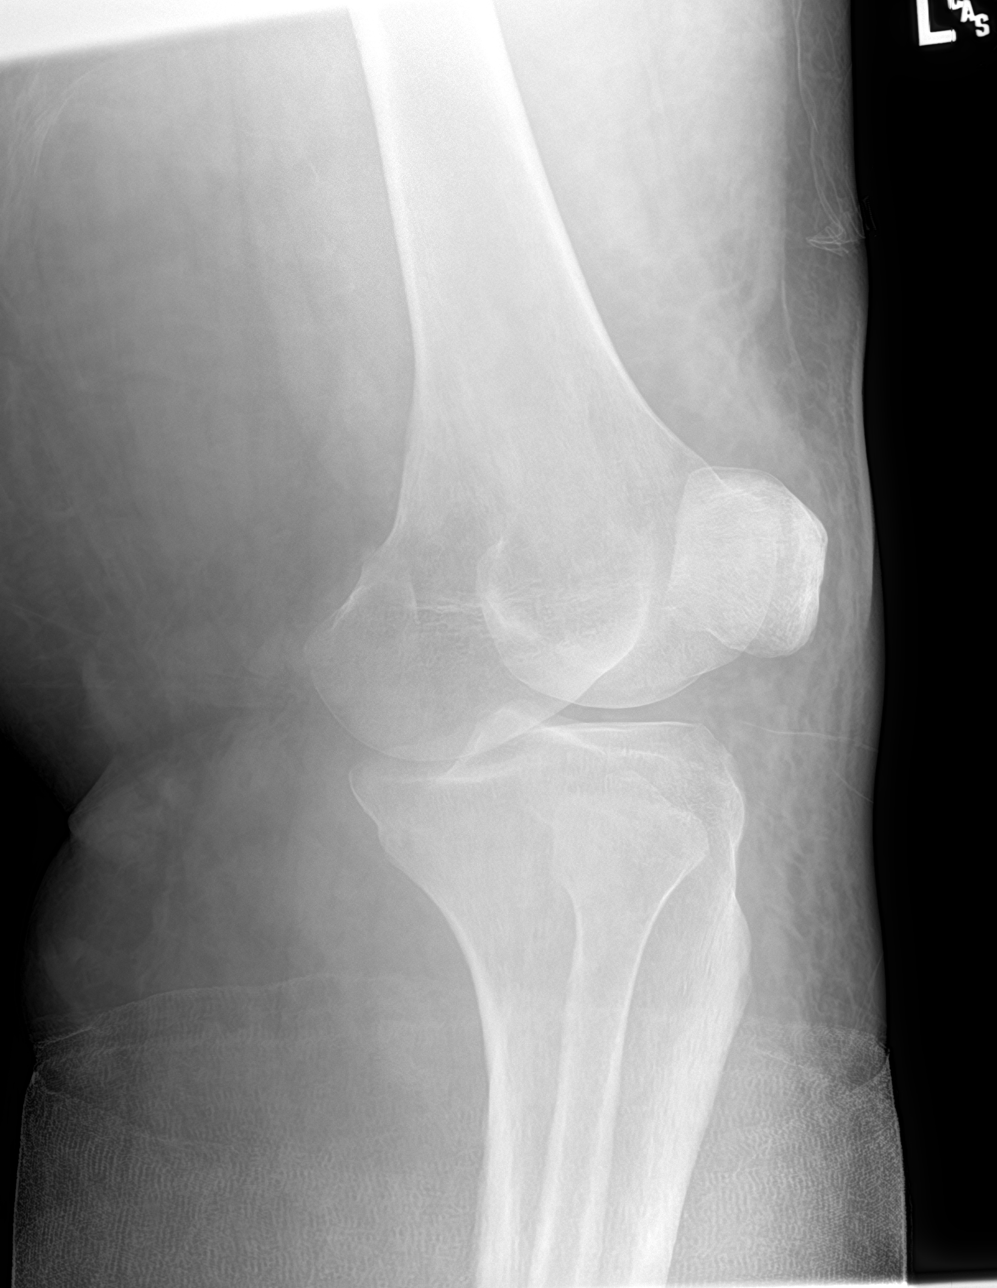

[4 of 4 positions shown; findings below may reference images not displayed]

FINDINGS: Four views of the left knee submitted. No acute fracture or
subluxation. No radiopaque foreign body. Diffuse mild narrowing of
joint space.
IMPRESSION: No acute fracture or subluxation. Mild degenerative changes with
diffuse mild narrowing of joint space.

## 2017-10-30 ENCOUNTER — Encounter (HOSPITAL_COMMUNITY): Payer: Self-pay

## 2017-10-30 ENCOUNTER — Emergency Department (HOSPITAL_COMMUNITY)
Admission: EM | Admit: 2017-10-30 | Discharge: 2017-10-31 | Disposition: A | Discharge status: Home / Self Care | Payer: Medicaid Other | Attending: Emergency Medicine | Admitting: Emergency Medicine

## 2017-10-30 DIAGNOSIS — Z960 Presence of urogenital implants: Secondary | ICD-10-CM | POA: Diagnosis not present

## 2017-10-30 DIAGNOSIS — R10813 Right lower quadrant abdominal tenderness: Secondary | ICD-10-CM | POA: Insufficient documentation

## 2017-10-30 DIAGNOSIS — Z7982 Long term (current) use of aspirin: Secondary | ICD-10-CM | POA: Diagnosis not present

## 2017-10-30 DIAGNOSIS — Z978 Presence of other specified devices: Secondary | ICD-10-CM

## 2017-10-30 DIAGNOSIS — Z87891 Personal history of nicotine dependence: Secondary | ICD-10-CM | POA: Diagnosis not present

## 2017-10-30 DIAGNOSIS — K5900 Constipation, unspecified: Secondary | ICD-10-CM | POA: Diagnosis present

## 2017-10-30 DIAGNOSIS — Z79899 Other long term (current) drug therapy: Secondary | ICD-10-CM | POA: Diagnosis not present

## 2017-10-30 DIAGNOSIS — E876 Hypokalemia: Secondary | ICD-10-CM | POA: Diagnosis not present

## 2017-10-30 DIAGNOSIS — R10814 Left lower quadrant abdominal tenderness: Secondary | ICD-10-CM | POA: Diagnosis not present

## 2017-10-30 DIAGNOSIS — I10 Essential (primary) hypertension: Secondary | ICD-10-CM | POA: Insufficient documentation

## 2017-10-30 LAB — BASIC METABOLIC PANEL
Anion gap: 14 (ref 5–15)
BUN: 14 mg/dL (ref 6–20)
CALCIUM: 8.8 mg/dL — AB (ref 8.9–10.3)
CO2: 33 mmol/L — AB (ref 22–32)
Chloride: 93 mmol/L — ABNORMAL LOW (ref 101–111)
Creatinine, Ser: 0.87 mg/dL (ref 0.61–1.24)
GFR calc Af Amer: >60 mL/min (ref >60)
Glucose, Bld: 130 mg/dL — ABNORMAL HIGH (ref 65–99)
Potassium: 2.6 mmol/L — CL (ref 3.5–5.1)
Sodium: 140 mmol/L (ref 135–145)

## 2017-10-30 LAB — CBC WITH DIFFERENTIAL/PLATELET
Basophils Absolute: 0 10*3/uL (ref 0.0–0.1)
Basophils Relative: 0 %
EOS PCT: 1 %
Eosinophils Absolute: 0.1 10*3/uL (ref 0.0–0.7)
HEMATOCRIT: 38.9 % — AB (ref 39.0–52.0)
Hemoglobin: 13.1 g/dL (ref 13.0–17.0)
LYMPHS ABS: 2.1 10*3/uL (ref 0.7–4.0)
Lymphocytes Relative: 22 %
MCH: 28.4 pg (ref 26.0–34.0)
MCHC: 33.7 g/dL (ref 30.0–36.0)
MCV: 84.4 fL (ref 78.0–100.0)
MONO ABS: 0.7 10*3/uL (ref 0.1–1.0)
MONOS PCT: 8 %
Neutro Abs: 6.4 10*3/uL (ref 1.7–7.7)
Neutrophils Relative %: 69 %
PLATELETS: 392 10*3/uL (ref 150–400)
RBC: 4.61 MIL/uL (ref 4.22–5.81)
RDW: 14.6 % (ref 11.5–15.5)
WBC: 9.3 10*3/uL (ref 4.0–10.5)

## 2017-10-30 LAB — URINALYSIS, ROUTINE W REFLEX MICROSCOPIC
Bilirubin Urine: NEGATIVE
GLUCOSE, UA: NEGATIVE mg/dL
Ketones, ur: NEGATIVE mg/dL
Nitrite: POSITIVE — AB
PH: 8 (ref 5.0–8.0)
Protein, ur: NEGATIVE mg/dL
RBC / HPF: >50 RBC/hpf — ABNORMAL HIGH (ref 0–5)
SPECIFIC GRAVITY, URINE: 1.006 (ref 1.005–1.030)

## 2017-10-30 LAB — MAGNESIUM: Magnesium: 2 mg/dL (ref 1.7–2.4)

## 2017-10-30 MED ORDER — POTASSIUM CHLORIDE 10 MEQ/100ML IV SOLN: 10.0000 meq | Freq: Once | INTRAVENOUS | Status: DC

## 2017-10-30 MED ORDER — MILK AND MOLASSES ENEMA
1.0000 | Freq: Once | RECTAL | Status: AC
  Administered 2017-10-30: 250 mL via RECTAL
  Filled 2017-10-30: qty 250

## 2017-10-30 MED ORDER — SODIUM CHLORIDE 0.9 % IV SOLN
2.0000 g | Freq: Once | INTRAVENOUS | Status: AC
  Administered 2017-10-30: 2 g via INTRAVENOUS
  Filled 2017-10-30: qty 20

## 2017-10-30 MED ORDER — SODIUM CHLORIDE 0.9 % IV BOLUS
1000.0000 mL | Freq: Once | INTRAVENOUS | Status: AC
  Administered 2017-10-30: 1000 mL via INTRAVENOUS

## 2017-10-30 MED ORDER — POTASSIUM CHLORIDE 10 MEQ/100ML IV SOLN
10.0000 meq | INTRAVENOUS | Status: AC
  Administered 2017-10-31 (×2): 10 meq via INTRAVENOUS
  Filled 2017-10-30 (×2): qty 100

## 2017-10-30 MED ORDER — POTASSIUM CHLORIDE CRYS ER 20 MEQ PO TBCR
40.0000 meq | EXTENDED_RELEASE_TABLET | Freq: Once | ORAL | Status: AC
  Administered 2017-10-30: 40 meq via ORAL
  Filled 2017-10-30: qty 2

## 2017-10-30 NOTE — ED Notes (Addendum)
CRITICAL VALUE STICKER  CRITICAL VALUE: K 2.6  RECEIVER (on-site recipient of call): Leta JunglingJake T RN  DATE & TIME NOTIFIED: 10/30/17 1101p  MESSENGER (representative from lab): Hilda LiasMarie  MD NOTIFIED: Misty StanleyLisa PA  TIME OF NOTIFICATION: 1101p  RESPONSE: see orders

## 2017-10-30 NOTE — ED Notes (Signed)
[facility] Expected date:  Expected time:  Means of arrival:  Comments: EMS 52 yo male pain in catheter

## 2017-10-30 NOTE — ED Notes (Signed)
ED Provider at bedside. 

## 2017-10-30 NOTE — ED Triage Notes (Signed)
Pt arrived by [facility] from [facility]. Pt reports that he is impacted and does not remember his last bowel movement. Pt also reports pain around his foley catheter. Urine is yellow/white and has sediment in Catheter tubing. Pt alert and oriented on arrival.
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
## 2017-10-30 NOTE — ED Notes (Signed)
Bladder scanned Pt. Pt has 355cc in bladder at this time.

## 2017-10-31 MED ORDER — CEPHALEXIN 500 MG PO CAPS: 500.0000 mg | ORAL_CAPSULE | Freq: Three times a day (TID) | ORAL | 0 refills | Status: DC

## 2017-10-31 NOTE — ED Provider Notes (Signed)
Assumed care from PA Upstill.  See prior notes for full H&P.  Briefly, 52 y.o. M here with consiptaion and concern for UTI.  Has indwelling foley with frequent UTI's.  Was found to have stool in the rectum but no fecal impaction.  Was given enema.    Plan:  Labs and UA pending.  Will follow up.  Results for orders placed or performed during the hospital encounter of 10/30/17  Urinalysis, Routine w reflex microscopic  Result Value Ref Range   Color, Urine YELLOW YELLOW   APPearance CLOUDY (A) CLEAR   Specific Gravity, Urine 1.006 1.005 - 1.030   pH 8.0 5.0 - 8.0   Glucose, UA NEGATIVE NEGATIVE mg/dL   Hgb urine dipstick LARGE (A) NEGATIVE   Bilirubin Urine NEGATIVE NEGATIVE   Ketones, ur NEGATIVE NEGATIVE mg/dL   Protein, ur NEGATIVE NEGATIVE mg/dL   Nitrite POSITIVE (A) NEGATIVE   Leukocytes, UA SMALL (A) NEGATIVE   RBC / HPF >50 (H) 0 - 5 RBC/hpf   WBC, UA 21-50 0 - 5 WBC/hpf   Bacteria, UA MANY (A) NONE SEEN  CBC with Differential  Result Value Ref Range   WBC 9.3 4.0 - 10.5 K/uL   RBC 4.61 4.22 - 5.81 MIL/uL   Hemoglobin 13.1 13.0 - 17.0 g/dL   HCT 16.1 (L) 09.6 - 04.5 %   MCV 84.4 78.0 - 100.0 fL   MCH 28.4 26.0 - 34.0 pg   MCHC 33.7 30.0 - 36.0 g/dL   RDW 40.9 81.1 - 91.4 %   Platelets 392 150 - 400 K/uL   Neutrophils Relative % 69 %   Neutro Abs 6.4 1.7 - 7.7 K/uL   Lymphocytes Relative 22 %   Lymphs Abs 2.1 0.7 - 4.0 K/uL   Monocytes Relative 8 %   Monocytes Absolute 0.7 0.1 - 1.0 K/uL   Eosinophils Relative 1 %   Eosinophils Absolute 0.1 0.0 - 0.7 K/uL   Basophils Relative 0 %   Basophils Absolute 0.0 0.0 - 0.1 K/uL  Basic metabolic panel  Result Value Ref Range   Sodium 140 135 - 145 mmol/L   Potassium 2.6 (LL) 3.5 - 5.1 mmol/L   Chloride 93 (L) 101 - 111 mmol/L   CO2 33 (H) 22 - 32 mmol/L   Glucose, Bld 130 (H) 65 - 99 mg/dL   BUN 14 6 - 20 mg/dL   Creatinine, Ser 7.82 0.61 - 1.24 mg/dL   Calcium 8.8 (L) 8.9 - 10.3 mg/dL   GFR calc non Af Amer >60 >60  mL/min   GFR calc Af Amer >60 >60 mL/min   Anion gap 14 5 - 15  Magnesium  Result Value Ref Range   Magnesium 2.0 1.7 - 2.4 mg/dL   No results found.    Labs overall reassuring, normal white blood cell count.  He does have a hypokalemia at 2.6.  Has had multiple instances of low potassium in the past.  He does take demedex which is likely culprit.  Magnesium was added which is normal.  EKG without u-waves or other concerning findings.  He was given IV and p.o. K+ supplementation.  UA does appear infectious, nitrite positive.  Culture pending.  He remains afebrile and nontoxic in appearance.  Was given IV Rocephin here in the ED, will discharge home with Keflex pending urine culture.  Last culture grew out Proteus mirabilis that was sensitive to cephalosporins.  We will have him follow-up with PCP, will need potassium re-checked soon.  Patient  discharged home in stable condition.   Garlon HatchetSanders, Pauleen Goleman M, PA-C 10/31/17 0351    Melene PlanFloyd, Dan, DO 10/31/17 [phone]

## 2017-10-31 NOTE — ED Notes (Signed)
Call[facility] report to [facility]

## 2017-10-31 NOTE — Discharge Instructions (Addendum)
Take the prescribed medication as directed.    Potassium was low today and recommend this to be rechecked in the next week. Follow-up with your primary care doctor.   Return to the ED for new or worsening symptoms.

## 2017-10-31 NOTE — ED Notes (Signed)
Called [facility] to set up transport back to [facility]

## 2017-11-02 LAB — URINE CULTURE: Culture: >=100000 — AB

## 2017-11-03 ENCOUNTER — Telehealth: Payer: Self-pay | Admitting: Emergency Medicine

## 2017-11-03 NOTE — Telephone Encounter (Signed)
Post ED Visit - Positive Culture Follow-up  Culture report reviewed by antimicrobial stewardship pharmacist:  []  Enzo BiNathan Batchelder, Pharm.D. []  Celedonio MiyamotoJeremy Frens, Pharm.D., BCPS AQ-ID []  Garvin FilaMike Maccia, Pharm.D., BCPS []  Georgina PillionElizabeth Martin, Pharm.D., BCPS []  [place]h Pham, [address]D., BCPS, AAHIVP []  Estella HuskMichelle Turner, Pharm.D., BCPS, AAHIVP []  Lysle Pearlachel Rumbarger, PharmD, BCPS []  Sherlynn CarbonAustin Lucas, PharmD []  Pollyann SamplesAndy Johnston, PharmD, BCPS Cephus RicherBrooke Badgett PharmD  Positive urine culture Treated with cephalexin, organism sensitive to the same and no further patient follow-up is required at this time.  Berle MullMiller, Wynne Jury 11/03/2017, 1:03 PM

## 2018-06-14 ENCOUNTER — Inpatient Hospital Stay (HOSPITAL_COMMUNITY)
Admission: EM | Admit: 2018-06-14 | Discharge: 2018-06-19 | DRG: 698 | Disposition: A | Discharge status: Skilled Nursing Facility | Payer: Medicaid Other | Attending: Family Medicine | Admitting: Family Medicine

## 2018-06-14 ENCOUNTER — Encounter (HOSPITAL_COMMUNITY): Payer: Self-pay | Admitting: Emergency Medicine

## 2018-06-14 ENCOUNTER — Emergency Department (HOSPITAL_COMMUNITY): Payer: Medicaid Other

## 2018-06-14 DIAGNOSIS — E039 Hypothyroidism, unspecified: Secondary | ICD-10-CM | POA: Diagnosis present

## 2018-06-14 DIAGNOSIS — M24521 Contracture, right elbow: Secondary | ICD-10-CM | POA: Diagnosis present

## 2018-06-14 DIAGNOSIS — N3000 Acute cystitis without hematuria: Secondary | ICD-10-CM

## 2018-06-14 DIAGNOSIS — Z593 Problems related to living in residential institution: Secondary | ICD-10-CM

## 2018-06-14 DIAGNOSIS — J440 Chronic obstructive pulmonary disease with acute lower respiratory infection: Secondary | ICD-10-CM | POA: Diagnosis present

## 2018-06-14 DIAGNOSIS — T83518A Infection and inflammatory reaction due to other urinary catheter, initial encounter: Principal | ICD-10-CM | POA: Diagnosis present

## 2018-06-14 DIAGNOSIS — I69359 Hemiplegia and hemiparesis following cerebral infarction affecting unspecified side: Secondary | ICD-10-CM

## 2018-06-14 DIAGNOSIS — Z7982 Long term (current) use of aspirin: Secondary | ICD-10-CM

## 2018-06-14 DIAGNOSIS — Y95 Nosocomial condition: Secondary | ICD-10-CM | POA: Diagnosis present

## 2018-06-14 DIAGNOSIS — R000 Tachycardia, unspecified: Secondary | ICD-10-CM

## 2018-06-14 DIAGNOSIS — Z87891 Personal history of nicotine dependence: Secondary | ICD-10-CM

## 2018-06-14 DIAGNOSIS — R5381 Other malaise: Secondary | ICD-10-CM

## 2018-06-14 DIAGNOSIS — I69351 Hemiplegia and hemiparesis following cerebral infarction affecting right dominant side: Secondary | ICD-10-CM

## 2018-06-14 DIAGNOSIS — K219 Gastro-esophageal reflux disease without esophagitis: Secondary | ICD-10-CM | POA: Diagnosis present

## 2018-06-14 DIAGNOSIS — D649 Anemia, unspecified: Secondary | ICD-10-CM | POA: Diagnosis present

## 2018-06-14 DIAGNOSIS — Z87440 Personal history of urinary (tract) infections: Secondary | ICD-10-CM

## 2018-06-14 DIAGNOSIS — Y846 Urinary catheterization as the cause of abnormal reaction of the patient, or of later complication, without mention of misadventure at the time of the procedure: Secondary | ICD-10-CM | POA: Diagnosis present

## 2018-06-14 DIAGNOSIS — Z79890 Hormone replacement therapy (postmenopausal): Secondary | ICD-10-CM

## 2018-06-14 DIAGNOSIS — K5900 Constipation, unspecified: Secondary | ICD-10-CM | POA: Diagnosis present

## 2018-06-14 DIAGNOSIS — T83511A Infection and inflammatory reaction due to indwelling urethral catheter, initial encounter: Secondary | ICD-10-CM

## 2018-06-14 DIAGNOSIS — B965 Pseudomonas (aeruginosa) (mallei) (pseudomallei) as the cause of diseases classified elsewhere: Secondary | ICD-10-CM | POA: Diagnosis present

## 2018-06-14 DIAGNOSIS — E785 Hyperlipidemia, unspecified: Secondary | ICD-10-CM | POA: Diagnosis present

## 2018-06-14 DIAGNOSIS — I10 Essential (primary) hypertension: Secondary | ICD-10-CM | POA: Diagnosis present

## 2018-06-14 DIAGNOSIS — E876 Hypokalemia: Secondary | ICD-10-CM | POA: Diagnosis present

## 2018-06-14 DIAGNOSIS — N35919 Unspecified urethral stricture, male, unspecified site: Secondary | ICD-10-CM | POA: Diagnosis present

## 2018-06-14 DIAGNOSIS — I890 Lymphedema, not elsewhere classified: Secondary | ICD-10-CM | POA: Diagnosis present

## 2018-06-14 DIAGNOSIS — R509 Fever, unspecified: Secondary | ICD-10-CM

## 2018-06-14 DIAGNOSIS — J189 Pneumonia, unspecified organism: Secondary | ICD-10-CM | POA: Diagnosis present

## 2018-06-14 DIAGNOSIS — Z6839 Body mass index (BMI) 39.0-39.9, adult: Secondary | ICD-10-CM

## 2018-06-14 DIAGNOSIS — Z79899 Other long term (current) drug therapy: Secondary | ICD-10-CM

## 2018-06-14 DIAGNOSIS — N390 Urinary tract infection, site not specified: Secondary | ICD-10-CM | POA: Diagnosis present

## 2018-06-14 DIAGNOSIS — A419 Sepsis, unspecified organism: Secondary | ICD-10-CM | POA: Diagnosis present

## 2018-06-14 DIAGNOSIS — G4733 Obstructive sleep apnea (adult) (pediatric): Secondary | ICD-10-CM | POA: Diagnosis present

## 2018-06-14 HISTORY — DX: Hypotension, unspecified: I95.9

## 2018-06-14 HISTORY — DX: Repeated falls: R29.6

## 2018-06-14 HISTORY — DX: Altered mental status, unspecified: R41.82

## 2018-06-14 HISTORY — DX: Patient's other noncompliance with medication regimen: Z91.14

## 2018-06-14 HISTORY — DX: Weakness: R53.1

## 2018-06-14 HISTORY — DX: Seborrhea capitis: L21.0

## 2018-06-14 LAB — CBC WITH DIFFERENTIAL/PLATELET
Abs Immature Granulocytes: 0.06 10*3/uL (ref 0.00–0.07)
BASOS PCT: 0 %
Basophils Absolute: 0.1 10*3/uL (ref 0.0–0.1)
EOS ABS: 0 10*3/uL (ref 0.0–0.5)
Eosinophils Relative: 0 %
HCT: 38.4 % — ABNORMAL LOW (ref 39.0–52.0)
Hemoglobin: 12.8 g/dL — ABNORMAL LOW (ref 13.0–17.0)
Immature Granulocytes: 0 %
Lymphocytes Relative: 6 %
Lymphs Abs: 0.8 10*3/uL (ref 0.7–4.0)
MCH: 27.7 pg (ref 26.0–34.0)
MCHC: 33.3 g/dL (ref 30.0–36.0)
MCV: 83.1 fL (ref 80.0–100.0)
MONO ABS: 0.9 10*3/uL (ref 0.1–1.0)
MONOS PCT: 7 %
NEUTROS PCT: 87 %
Neutro Abs: 11.7 10*3/uL — ABNORMAL HIGH (ref 1.7–7.7)
Platelets: 362 10*3/uL (ref 150–400)
RBC: 4.62 MIL/uL (ref 4.22–5.81)
RDW: 14.8 % (ref 11.5–15.5)
WBC: 13.5 10*3/uL — AB (ref 4.0–10.5)
nRBC: 0 % (ref 0.0–0.2)

## 2018-06-14 LAB — URINALYSIS, ROUTINE W REFLEX MICROSCOPIC
Bilirubin Urine: NEGATIVE
GLUCOSE, UA: NEGATIVE mg/dL
KETONES UR: NEGATIVE mg/dL
NITRITE: NEGATIVE
PH: 7 (ref 5.0–8.0)
PROTEIN: 100 mg/dL — AB
Specific Gravity, Urine: 1.012 (ref 1.005–1.030)
WBC, UA: >50 WBC/hpf — ABNORMAL HIGH (ref 0–5)

## 2018-06-14 LAB — COMPREHENSIVE METABOLIC PANEL
ALBUMIN: 3.3 g/dL — AB (ref 3.5–5.0)
ALK PHOS: 90 U/L (ref 38–126)
ALT: 13 U/L (ref 0–44)
AST: 14 U/L — AB (ref 15–41)
Anion gap: 12 (ref 5–15)
BILIRUBIN TOTAL: 0.9 mg/dL (ref 0.3–1.2)
BUN: 5 mg/dL — AB (ref 6–20)
CALCIUM: 8.7 mg/dL — AB (ref 8.9–10.3)
CO2: 29 mmol/L (ref 22–32)
Chloride: 95 mmol/L — ABNORMAL LOW (ref 98–111)
Creatinine, Ser: 0.84 mg/dL (ref 0.61–1.24)
GFR calc Af Amer: >60 mL/min (ref >60)
GFR calc non Af Amer: >60 mL/min (ref >60)
GLUCOSE: 129 mg/dL — AB (ref 70–99)
Potassium: 2.7 mmol/L — CL (ref 3.5–5.1)
SODIUM: 136 mmol/L (ref 135–145)
TOTAL PROTEIN: 7.9 g/dL (ref 6.5–8.1)

## 2018-06-14 LAB — INFLUENZA PANEL BY PCR (TYPE A & B)
Influenza A By PCR: NEGATIVE
Influenza B By PCR: NEGATIVE

## 2018-06-14 LAB — LIPASE, BLOOD: Lipase: 23 U/L (ref 11–51)

## 2018-06-14 LAB — LACTIC ACID, PLASMA: Lactic Acid, Venous: 1.3 mmol/L (ref 0.5–1.9)

## 2018-06-14 MED ORDER — SODIUM CHLORIDE 0.9 % IV BOLUS
1000.0000 mL | Freq: Once | INTRAVENOUS | Status: AC
  Administered 2018-06-14: 1000 mL via INTRAVENOUS

## 2018-06-14 MED ORDER — POTASSIUM CHLORIDE 10 MEQ/100ML IV SOLN
10.0000 meq | INTRAVENOUS | Status: AC
  Administered 2018-06-14 (×2): 10 meq via INTRAVENOUS
  Filled 2018-06-14 (×2): qty 100

## 2018-06-14 MED ORDER — CEFTRIAXONE SODIUM 1 G IJ SOLR
1.0000 g | Freq: Once | INTRAMUSCULAR | Status: AC
  Administered 2018-06-14: 1 g via INTRAVENOUS
  Filled 2018-06-14: qty 10

## 2018-06-14 MED ORDER — ACETAMINOPHEN 325 MG PO TABS
650.0000 mg | ORAL_TABLET | Freq: Once | ORAL | Status: AC
  Administered 2018-06-14: 650 mg via ORAL

## 2018-06-14 MED ORDER — POTASSIUM CHLORIDE CRYS ER 20 MEQ PO TBCR
40.0000 meq | EXTENDED_RELEASE_TABLET | Freq: Once | ORAL | Status: AC
  Administered 2018-06-14: 40 meq via ORAL
  Filled 2018-06-14: qty 2

## 2018-06-14 NOTE — ED Notes (Signed)
Patient transported to X-ray 
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
## 2018-06-14 NOTE — ED Triage Notes (Signed)
Pt arrives via EMS from [facility] with reports of pt not feeling well today. Pt has chronic foley. 500 tylenol given by EMS. Pt refused any IV

## 2018-06-15 ENCOUNTER — Inpatient Hospital Stay (HOSPITAL_COMMUNITY): Payer: Medicaid Other

## 2018-06-15 DIAGNOSIS — K219 Gastro-esophageal reflux disease without esophagitis: Secondary | ICD-10-CM | POA: Diagnosis present

## 2018-06-15 DIAGNOSIS — Z79899 Other long term (current) drug therapy: Secondary | ICD-10-CM | POA: Diagnosis not present

## 2018-06-15 DIAGNOSIS — N3000 Acute cystitis without hematuria: Secondary | ICD-10-CM | POA: Diagnosis not present

## 2018-06-15 DIAGNOSIS — I69351 Hemiplegia and hemiparesis following cerebral infarction affecting right dominant side: Secondary | ICD-10-CM | POA: Diagnosis not present

## 2018-06-15 DIAGNOSIS — T83518A Infection and inflammatory reaction due to other urinary catheter, initial encounter: Secondary | ICD-10-CM | POA: Diagnosis not present

## 2018-06-15 DIAGNOSIS — Z6839 Body mass index (BMI) 39.0-39.9, adult: Secondary | ICD-10-CM | POA: Diagnosis not present

## 2018-06-15 DIAGNOSIS — N390 Urinary tract infection, site not specified: Secondary | ICD-10-CM | POA: Diagnosis not present

## 2018-06-15 DIAGNOSIS — I10 Essential (primary) hypertension: Secondary | ICD-10-CM | POA: Diagnosis present

## 2018-06-15 DIAGNOSIS — J440 Chronic obstructive pulmonary disease with acute lower respiratory infection: Secondary | ICD-10-CM | POA: Diagnosis present

## 2018-06-15 DIAGNOSIS — I890 Lymphedema, not elsewhere classified: Secondary | ICD-10-CM | POA: Diagnosis present

## 2018-06-15 DIAGNOSIS — T83511A Infection and inflammatory reaction due to indwelling urethral catheter, initial encounter: Secondary | ICD-10-CM | POA: Diagnosis not present

## 2018-06-15 DIAGNOSIS — Y846 Urinary catheterization as the cause of abnormal reaction of the patient, or of later complication, without mention of misadventure at the time of the procedure: Secondary | ICD-10-CM | POA: Diagnosis present

## 2018-06-15 DIAGNOSIS — Z593 Problems related to living in residential institution: Secondary | ICD-10-CM | POA: Diagnosis not present

## 2018-06-15 DIAGNOSIS — R5381 Other malaise: Secondary | ICD-10-CM | POA: Diagnosis not present

## 2018-06-15 DIAGNOSIS — J189 Pneumonia, unspecified organism: Secondary | ICD-10-CM | POA: Diagnosis present

## 2018-06-15 DIAGNOSIS — E785 Hyperlipidemia, unspecified: Secondary | ICD-10-CM | POA: Diagnosis present

## 2018-06-15 DIAGNOSIS — A419 Sepsis, unspecified organism: Secondary | ICD-10-CM | POA: Diagnosis present

## 2018-06-15 DIAGNOSIS — I69359 Hemiplegia and hemiparesis following cerebral infarction affecting unspecified side: Secondary | ICD-10-CM | POA: Diagnosis not present

## 2018-06-15 DIAGNOSIS — R000 Tachycardia, unspecified: Secondary | ICD-10-CM | POA: Diagnosis not present

## 2018-06-15 DIAGNOSIS — Z7982 Long term (current) use of aspirin: Secondary | ICD-10-CM | POA: Diagnosis not present

## 2018-06-15 DIAGNOSIS — Y95 Nosocomial condition: Secondary | ICD-10-CM | POA: Diagnosis present

## 2018-06-15 DIAGNOSIS — Z87440 Personal history of urinary (tract) infections: Secondary | ICD-10-CM | POA: Diagnosis not present

## 2018-06-15 DIAGNOSIS — N35919 Unspecified urethral stricture, male, unspecified site: Secondary | ICD-10-CM | POA: Diagnosis present

## 2018-06-15 DIAGNOSIS — D649 Anemia, unspecified: Secondary | ICD-10-CM | POA: Diagnosis present

## 2018-06-15 DIAGNOSIS — K5900 Constipation, unspecified: Secondary | ICD-10-CM | POA: Diagnosis present

## 2018-06-15 DIAGNOSIS — Z79890 Hormone replacement therapy (postmenopausal): Secondary | ICD-10-CM | POA: Diagnosis not present

## 2018-06-15 DIAGNOSIS — M24521 Contracture, right elbow: Secondary | ICD-10-CM | POA: Diagnosis present

## 2018-06-15 DIAGNOSIS — G4733 Obstructive sleep apnea (adult) (pediatric): Secondary | ICD-10-CM | POA: Diagnosis present

## 2018-06-15 DIAGNOSIS — E876 Hypokalemia: Secondary | ICD-10-CM | POA: Diagnosis present

## 2018-06-15 DIAGNOSIS — R509 Fever, unspecified: Secondary | ICD-10-CM | POA: Diagnosis present

## 2018-06-15 DIAGNOSIS — Z87891 Personal history of nicotine dependence: Secondary | ICD-10-CM | POA: Diagnosis not present

## 2018-06-15 DIAGNOSIS — B965 Pseudomonas (aeruginosa) (mallei) (pseudomallei) as the cause of diseases classified elsewhere: Secondary | ICD-10-CM | POA: Diagnosis present

## 2018-06-15 DIAGNOSIS — E039 Hypothyroidism, unspecified: Secondary | ICD-10-CM | POA: Diagnosis present

## 2018-06-15 LAB — URINALYSIS, ROUTINE W REFLEX MICROSCOPIC
Bilirubin Urine: NEGATIVE
Glucose, UA: NEGATIVE mg/dL
Ketones, ur: 20 mg/dL — AB
Nitrite: NEGATIVE
Protein, ur: 30 mg/dL — AB
RBC / HPF: >50 RBC/hpf — ABNORMAL HIGH (ref 0–5)
Specific Gravity, Urine: 1.013 (ref 1.005–1.030)
pH: 7 (ref 5.0–8.0)

## 2018-06-15 LAB — CBC
HCT: 35.6 % — ABNORMAL LOW (ref 39.0–52.0)
Hemoglobin: 11.9 g/dL — ABNORMAL LOW (ref 13.0–17.0)
MCH: 27.4 pg (ref 26.0–34.0)
MCHC: 33.4 g/dL (ref 30.0–36.0)
MCV: 82 fL (ref 80.0–100.0)
PLATELETS: 254 10*3/uL (ref 150–400)
RBC: 4.34 MIL/uL (ref 4.22–5.81)
RDW: 15 % (ref 11.5–15.5)
WBC: 11.4 10*3/uL — ABNORMAL HIGH (ref 4.0–10.5)
nRBC: 0 % (ref 0.0–0.2)

## 2018-06-15 LAB — BASIC METABOLIC PANEL
Anion gap: 11 (ref 5–15)
BUN: 7 mg/dL (ref 6–20)
CALCIUM: 7.8 mg/dL — AB (ref 8.9–10.3)
CO2: 24 mmol/L (ref 22–32)
CREATININE: 0.92 mg/dL (ref 0.61–1.24)
Chloride: 106 mmol/L (ref 98–111)
GFR calc Af Amer: >60 mL/min (ref >60)
Glucose, Bld: 106 mg/dL — ABNORMAL HIGH (ref 70–99)
Potassium: 3 mmol/L — ABNORMAL LOW (ref 3.5–5.1)
Sodium: 141 mmol/L (ref 135–145)

## 2018-06-15 LAB — HIV ANTIBODY (ROUTINE TESTING W REFLEX): HIV Screen 4th Generation wRfx: NONREACTIVE

## 2018-06-15 LAB — URINE CULTURE: Culture: >=100000 — AB

## 2018-06-15 LAB — TSH: TSH: 0.15 u[IU]/mL — ABNORMAL LOW (ref 0.350–4.500)

## 2018-06-15 LAB — MAGNESIUM: Magnesium: 1.8 mg/dL (ref 1.7–2.4)

## 2018-06-15 LAB — MRSA PCR SCREENING: MRSA by PCR: NEGATIVE

## 2018-06-15 MED ORDER — ONDANSETRON HCL 4 MG PO TABS: 4.0000 mg | ORAL_TABLET | Freq: Four times a day (QID) | ORAL | Status: DC | PRN

## 2018-06-15 MED ORDER — ATORVASTATIN CALCIUM 10 MG PO TABS
10.0000 mg | ORAL_TABLET | Freq: Every day | ORAL | Status: DC
  Administered 2018-06-16 – 2018-06-18 (×2): 10 mg via ORAL
  Filled 2018-06-15 (×4): qty 1

## 2018-06-15 MED ORDER — POTASSIUM CHLORIDE CRYS ER 20 MEQ PO TBCR
40.0000 meq | EXTENDED_RELEASE_TABLET | Freq: Once | ORAL | Status: AC
  Administered 2018-06-15: 40 meq via ORAL
  Filled 2018-06-15: qty 2

## 2018-06-15 MED ORDER — ACETAMINOPHEN 325 MG PO TABS
650.0000 mg | ORAL_TABLET | Freq: Four times a day (QID) | ORAL | Status: DC | PRN
  Administered 2018-06-15 (×3): 650 mg via ORAL
  Filled 2018-06-15 (×4): qty 2

## 2018-06-15 MED ORDER — ENOXAPARIN SODIUM 40 MG/0.4ML ~~LOC~~ SOLN
40.0000 mg | Freq: Every day | SUBCUTANEOUS | Status: DC
  Administered 2018-06-15 – 2018-06-19 (×5): 40 mg via SUBCUTANEOUS
  Filled 2018-06-15 (×5): qty 0.4

## 2018-06-15 MED ORDER — TAMSULOSIN HCL 0.4 MG PO CAPS
0.4000 mg | ORAL_CAPSULE | Freq: Every day | ORAL | Status: DC
  Administered 2018-06-15 – 2018-06-18 (×4): 0.4 mg via ORAL
  Filled 2018-06-15 (×4): qty 1

## 2018-06-15 MED ORDER — SODIUM CHLORIDE 0.9 % IV SOLN
1.0000 g | INTRAVENOUS | Status: DC
  Filled 2018-06-15: qty 10

## 2018-06-15 MED ORDER — POLYETHYLENE GLYCOL 3350 17 G PO PACK
17.0000 g | PACK | Freq: Every day | ORAL | Status: DC
  Administered 2018-06-15 – 2018-06-19 (×4): 17 g via ORAL
  Filled 2018-06-15 (×5): qty 1

## 2018-06-15 MED ORDER — SODIUM CHLORIDE 0.9 % IV SOLN
1.0000 g | Freq: Three times a day (TID) | INTRAVENOUS | Status: DC
  Administered 2018-06-15 – 2018-06-16 (×2): 1 g via INTRAVENOUS
  Filled 2018-06-15 (×3): qty 1

## 2018-06-15 MED ORDER — VITAMIN D 25 MCG (1000 UNIT) PO TABS
5000.0000 [IU] | ORAL_TABLET | Freq: Every day | ORAL | Status: DC
  Administered 2018-06-15 – 2018-06-19 (×5): 5000 [IU] via ORAL
  Filled 2018-06-15 (×5): qty 5

## 2018-06-15 MED ORDER — POTASSIUM CHLORIDE CRYS ER 20 MEQ PO TBCR
40.0000 meq | EXTENDED_RELEASE_TABLET | Freq: Once | ORAL | Status: AC
  Administered 2018-06-15: 40 meq via ORAL

## 2018-06-15 MED ORDER — SODIUM CHLORIDE 0.9 % IV SOLN
INTRAVENOUS | Status: AC
  Administered 2018-06-15 (×2): via INTRAVENOUS

## 2018-06-15 MED ORDER — LEVOTHYROXINE SODIUM 75 MCG PO TABS
37.5000 ug | ORAL_TABLET | Freq: Every day | ORAL | Status: DC
  Administered 2018-06-16 – 2018-06-19 (×4): 37.5 ug via ORAL
  Filled 2018-06-15 (×6): qty 1

## 2018-06-15 MED ORDER — SENNA 8.6 MG PO TABS
1.0000 | ORAL_TABLET | Freq: Two times a day (BID) | ORAL | Status: DC
  Administered 2018-06-15 – 2018-06-19 (×8): 8.6 mg via ORAL
  Filled 2018-06-15 (×9): qty 1

## 2018-06-15 MED ORDER — ACETAMINOPHEN 650 MG RE SUPP: 650.0000 mg | Freq: Four times a day (QID) | RECTAL | Status: DC | PRN

## 2018-06-15 MED ORDER — LEVOTHYROXINE SODIUM 50 MCG PO TABS
50.0000 ug | ORAL_TABLET | Freq: Every day | ORAL | Status: DC
  Administered 2018-06-15: 50 ug via ORAL
  Filled 2018-06-15: qty 1

## 2018-06-15 MED ORDER — ASPIRIN EC 81 MG PO TBEC
81.0000 mg | DELAYED_RELEASE_TABLET | Freq: Every day | ORAL | Status: DC
  Administered 2018-06-15 – 2018-06-19 (×5): 81 mg via ORAL
  Filled 2018-06-15 (×5): qty 1

## 2018-06-15 MED ORDER — ONDANSETRON HCL 4 MG/2ML IJ SOLN: 4.0000 mg | Freq: Four times a day (QID) | INTRAMUSCULAR | Status: DC | PRN
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
## 2018-06-15 NOTE — Progress Notes (Signed)
Patient agreed to evening scheduled meds, then refused to take when nurse was in the room.  "I'm not ready to take them, put them in your pocket." Asked what time he would like me to bring them, "I don't have a schedule." Became irritated, "those are junk medicines." Asked patient to call if felt poorly as we were trying to keep fever down with Tylenol, patient agreed.
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
## 2018-06-15 NOTE — Progress Notes (Signed)
Went to patient's room because of a MEWS score of 4.  He does not appear worse than when I saw him in the ED.  Still tachycardic, febrile, normotensive. Resting in bed. Does not appear to be in acute distress.

## 2018-06-15 NOTE — Progress Notes (Addendum)
0150 received patient from ED, responds to voice, alert to person, disoriented to time, location, situation.  Patient refused MRSA swab.  0200 paged provider that patient has a red MEWS, asked for them to come see patient. Notified patient has a temp of 103.

## 2018-06-15 NOTE — Evaluation (Signed)
Physical Therapy Evaluation Patient Details Name: [name] MRN: [id number] DOB: 01/27/1966 Today's Date: 06/15/2018   History of Present Illness  [name] is a 53 y.o. male presenting with fever, chills, dry cough and malaise concerning for UTI . PMH is significant for CVA w/ residual rightsided weakness, hypothyroidism, tachycardia, lymphedema, HLD. Found to have sepsis likely due to UTI  Clinical Impression   Pt admitted with above diagnosis. Pt currently with functional limitations due to the deficits listed below (see PT Problem List). Dependent for mobility and ADLs at SNF prior to admission; Presents with decr balance, decr mobility, R hemiparesis; Tells [place] he typically uses a mechanical lift for getting OOB to chair at SNF -- but that he uses a standing-type lift; Pt will benefit from skilled PT to increase their independence and safety with mobility to allow discharge to the venue listed below.  Plan to follow at a distance as he is close to baseline -- will try a Huntley Dec Plus/standing-type lift next session     Follow Up Recommendations SNF    Equipment Recommendations  None recommended by PT    Recommendations for Other Services OT consult(OT looking into splint)     Precautions / Restrictions Precautions Precautions: Fall Precaution Comments: RUE contractures, spasms (pt reports they do not hurt) Restrictions Weight Bearing Restrictions: No      Mobility  Bed Mobility Overal bed mobility: Needs Assistance Bed Mobility: Rolling;Supine to Sit Rolling: Max assist   Supine to sit: Total assist;+2 for physical assistance     General bed mobility comments: rolling Mod A to right, max A to left; total assist for all aspects of bed mobiltiy  Transfers Overall transfer level: Needs assistance               General transfer comment: 3 person assist for dependent lift transfer OOB to chair with [facility]  Ambulation/Gait                 Stairs            Wheelchair Mobility    Modified Rankin (Stroke Patients Only)       Balance Overall balance assessment: Needs assistance Sitting-balance support: Single extremity supported;Feet supported Sitting balance-Leahy Scale: Zero Sitting balance - Comments: varied from Mod A to total A with tendency to go posteriorly; while seated in recliner pt needed min A to pull himself forward with LUE on arm rest of recliner                                     Pertinent Vitals/Pain Pain Assessment: No/denies pain    Home Living Family/patient expects to be discharged to:: Skilled nursing facility                 Additional Comments: Lives at [facility]    Prior Function Level of Independence: Needs assistance   Gait / Transfers Assistance Needed: Non ambulatory, reports they use Huntley Dec Plus to transfer him in and OOB, trouble manuvering W/C with left side only  ADL's / Homemaking Assistance Needed: Reports staff do most of ADLs        Hand Dominance   Dominant Hand: Right(but uses LUE now due to CVA in 03/2017 that affected right side)    Extremity/Trunk Assessment   Upper Extremity Assessment Upper Extremity Assessment: Defer to OT evaluation RUE Deficits / Details: Increased tone throughout. Does have some  active movement at all joints but needs A to faciliate some of the movements due to him having learned some patterns and not others. Natural lay of arm/hand now is elbow flexed, wrist extended, digits flexed ([facility] and IP of thumb hyper extended) RUE Coordination: decreased fine motor;decreased gross motor    Lower Extremity Assessment Lower Extremity Assessment: Generalized weakness;RLE deficits/detail RLE Deficits / Details: Pt states R side "is messed up"; noted active movement hip and knee though minimal; incr tone throughout with unpredictable spasms into both knee flexion and extension, and into hip extension        Communication   Communication: No difficulties  Cognition Arousal/Alertness: Awake/alert Behavior During Therapy: WFL for tasks assessed/performed Overall Cognitive Status: Within Functional Limits for tasks assessed                                        General Comments      Exercises     Assessment/Plan    PT Assessment Patient needs continued PT services  PT Problem List Decreased strength;Decreased range of motion;Decreased balance;Decreased activity tolerance;Decreased mobility;Decreased coordination;Decreased cognition;Decreased knowledge of use of DME;Decreased safety awareness       PT Treatment Interventions DME instruction;Functional mobility training;Therapeutic activities;Therapeutic exercise;Neuromuscular re-education;Balance training;Cognitive remediation;Patient/family education;Wheelchair mobility training    PT Goals (Current goals can be found in the Care Plan section)  Acute Rehab PT Goals Patient Stated Goal: looking foreward to trying splint for RUE PT Goal Formulation: With patient Time For Goal Achievement: 06/29/18 Potential to Achieve Goals: Fair Additional Goals Additional Goal #1: Pt will transfer sit to stand with Huntley DecSara Plus, and tolerate 3-5 minutes standing; with +2 assist    Frequency Min 1X/week   Barriers to discharge        Co-evaluation PT/OT/SLP Co-Evaluation/Treatment: Yes Reason for Co-Treatment: For patient/therapist safety PT goals addressed during session: Mobility/safety with mobility OT goals addressed during session: ADL's and self-care       AM-PAC PT "6 Clicks" Mobility  Outcome Measure Help needed turning from your back to your side while in a flat bed without using bedrails?: Total Help needed moving from lying on your back to sitting on the side of a flat bed without using bedrails?: Total Help needed moving to and from a bed to a chair (including a wheelchair)?: Total Help needed standing up from a  chair using your arms (e.g., wheelchair or bedside chair)?: Total Help needed to walk in hospital room?: Total Help needed climbing 3-5 steps with a railing? : Total 6 Click Score: 6    End of Session Equipment Utilized During Treatment: Other (comment)(Mechanical lift) Activity Tolerance: Patient tolerated treatment well Patient left: in chair;with call bell/phone within reach;Other (comment)(working with ST) Nurse Communication: Mobility status;Need for lift equipment PT Visit Diagnosis: Other abnormalities of gait and mobility (R26.89);Hemiplegia and hemiparesis;Muscle weakness (generalized) (M62.81) Hemiplegia - Right/Left: Right Hemiplegia - dominant/non-dominant: Dominant Hemiplegia - caused by: Cerebral infarction    Time: 1201-1234 PT Time Calculation (min) (ACUTE ONLY): 33 min   Charges:   PT Evaluation $PT Eval Moderate Complexity: 1 Mod          Van ClinesHolly Violanda Bobeck, PT  Acute Rehabilitation Services Pager [phone] Office [phone]   Levi AlandHolly H Delories Mauri 06/15/2018, 3:17 PM

## 2018-06-15 NOTE — Progress Notes (Signed)
Orthopedic Tech Progress Note Patient Details:  [name] 1966/01/14 [id number] Called and placed order with [employer]         [name] [name] 06/15/2018, 1:05 PM

## 2018-06-15 NOTE — Progress Notes (Signed)
Pt indicated that his foley was changed just recently. He refused the foley to be changed.  I spoke to the Resident about this. I reviewed the orders from [facility] in [place] , and there are orders from 04/08/2018 for the foley catheter to be changed monthly.  The Pt's catheter appears to have been changed on 06/08/2018, according to the documentation, from what I could see

## 2018-06-15 NOTE — Evaluation (Signed)
Occupational Therapy Evaluation Patient Details Name: Joshua ClampRonald E Mccormick MRN: [id number] DOB: 01/05/1966 Today's Date: 06/15/2018    History of Present Illness [name] is a 53 y.o. male presenting with fever, chills, dry cough and malaise concerning for UTI . PMH is significant for CVA w/ residual rightsided weakness, hypothyroidism, tachycardia, lymphedema, HLD. Found to have sepsis likely due to UTI   Clinical Impression   This [age] yo male admitted with above presents to acute OT with decreased mobility, decreased use of RUE but has some movement, decreased sitting balance, and obesity all affecting his ability to A with basic ADLs. Pt will benefit from acute OT with follow up OT at SNF. Feel it is worth trying a resting hand splint to prevent further contractures of RUE and to maybe get more movement.    Follow Up Recommendations  SNF;Supervision/Assistance - 24 hour    Equipment Recommendations  None recommended by OT       Precautions / Restrictions Precautions Precautions: Fall Precaution Comments: RUE contractures, spasms (pt reports they do not hurt) Restrictions Weight Bearing Restrictions: No      Mobility Bed Mobility Overal bed mobility: Needs Assistance Bed Mobility: Rolling;Supine to Sit     Supine to sit: Total assist;+2 for physical assistance     General bed mobility comments: rolling Mod A to right, max A to left  Transfers Overall transfer level: Needs assistance                    Balance Overall balance assessment: Needs assistance Sitting-balance support: Single extremity supported;Feet supported Sitting balance-Leahy Scale: Zero Sitting balance - Comments: varied from Mod A to total A with tendency to go posteriorly; while seated in recliner pt needed min A to pull himself forward with LUE on arm rest of recliner                                   ADL either performed or assessed with clinical judgement   ADL  Overall ADL's : Needs assistance/impaired   Eating/Feeding Details (indicate cue type and reason): Depends on what it is, needs more A for items that require 2 hands (ie: something in a cup or open on a plate v. finger food easier)   Grooming Details (indicate cue type and reason): Depends on task, for applying deodorant Mod A but the others min A; sitting in recliner Upper Body Bathing: Maximal assistance Upper Body Bathing Details (indicate cue type and reason): sitting in recliner Lower Body Bathing: Maximal assistance;Bed level   Upper Body Dressing : Maximal assistance Upper Body Dressing Details (indicate cue type and reason): sitting in recliner Lower Body Dressing: Total assistance;Bed level                       Vision Patient Visual Report: No change from baseline              Pertinent Vitals/Pain Pain Assessment: No/denies pain     Hand Dominance Right(but uses LUE now due to CVA in 03/2017 that affected right side)   Extremity/Trunk Assessment Upper Extremity Assessment Upper Extremity Assessment: RUE deficits/detail RUE Deficits / Details: Increased tone throughout. Does have some active movement at all joints but needs A to faciliate some of the movements due to him having learned some patterns and not others. Natural lay of arm/hand now is elbow flexed, wrist extended, digits flexed (  more at [facility]) and IP of thumb hyper extended) RUE Coordination: decreased fine motor;decreased gross motor           Communication Communication Communication: No difficulties   Cognition Arousal/Alertness: Awake/alert Behavior During Therapy: WFL for tasks assessed/performed Overall Cognitive Status: Within Functional Limits for tasks assessed                                                Home Living Family/patient expects to be discharged to:: Skilled nursing facility                                 Additional Comments: Lives at  [facility]      Prior Functioning/Environment Level of Independence: Needs assistance  Gait / Transfers Assistance Needed: Non ambulatory, reports they use Sara Plus to transfer him in and OOB, trouble manuvering W/C with left side only ADL's / Homemaking Assistance Needed: Reports staff do most of ADLs Communication / Swallowing Assistance Needed: No issues          OT Problem List: Decreased strength;Decreased range of motion;Impaired balance (sitting and/or standing);Decreased coordination;Impaired UE functional use;Obesity;Impaired tone      OT Treatment/Interventions: Self-care/ADL training;Balance training;Splinting;DME and/or AE instruction;Patient/family education;Manual therapy    OT Goals(Current goals can be found in the care plan section) Acute Rehab OT Goals Patient Stated Goal: looking foreward to trying splint for RUE OT Goal Formulation: With patient Time For Goal Achievement: 06/29/18 Potential to Achieve Goals: Good  OT Frequency: Min 2X/week           Co-evaluation PT/OT/SLP Co-Evaluation/Treatment: Yes Reason for Co-Treatment: For patient/therapist safety PT goals addressed during session: Mobility/safety with mobility;Strengthening/ROM OT goals addressed during session: ADL's and self-care;Strengthening/ROM      AM-PAC OT "6 Clicks" Daily Activity     Outcome Measure Help from another person eating meals?: A Little Help from another person taking care of personal grooming?: A Little Help from another person toileting, which includes using toliet, bedpan, or urinal?: Total Help from another person bathing (including washing, rinsing, drying)?: A Lot Help from another person to put on and taking off regular upper body clothing?: A Lot Help from another person to put on and taking off regular lower body clothing?: Total 6 Click Score: 12   End of Session Equipment Utilized During Treatment: (Maxi move) Nurse Communication: Mobility  status(NT)  Activity Tolerance: Patient tolerated treatment well Patient left: in chair;with call bell/phone within reach;with chair alarm set  OT Visit Diagnosis: Other abnormalities of gait and mobility (R26.89);Muscle weakness (generalized) (M62.81);Hemiplegia and hemiparesis Hemiplegia - Right/Left: Right Hemiplegia - dominant/non-dominant: Dominant Hemiplegia - caused by: Cerebral infarction                Time: 1201-1233 OT Time Calculation (min): 32 min Charges:  OT General Charges $OT Visit: 1 Visit OT Evaluation $OT Eval Moderate Complexity: 1 Mod  Ignacia Palma, OTR/L Acute [employer] Pager [phone] Office [phone]     Evette Georges 06/15/2018, 1:18 PM

## 2018-06-16 ENCOUNTER — Encounter (HOSPITAL_COMMUNITY): Payer: Self-pay | Admitting: Family Medicine

## 2018-06-16 DIAGNOSIS — N3000 Acute cystitis without hematuria: Secondary | ICD-10-CM

## 2018-06-16 DIAGNOSIS — N390 Urinary tract infection, site not specified: Secondary | ICD-10-CM

## 2018-06-16 DIAGNOSIS — R509 Fever, unspecified: Secondary | ICD-10-CM

## 2018-06-16 DIAGNOSIS — I10 Essential (primary) hypertension: Secondary | ICD-10-CM

## 2018-06-16 LAB — CBC
HCT: 33.8 % — ABNORMAL LOW (ref 39.0–52.0)
Hemoglobin: 11.1 g/dL — ABNORMAL LOW (ref 13.0–17.0)
MCH: 27.1 pg (ref 26.0–34.0)
MCHC: 32.8 g/dL (ref 30.0–36.0)
MCV: 82.6 fL (ref 80.0–100.0)
Platelets: 278 10*3/uL (ref 150–400)
RBC: 4.09 MIL/uL — ABNORMAL LOW (ref 4.22–5.81)
RDW: 14.9 % (ref 11.5–15.5)
WBC: 11.8 10*3/uL — ABNORMAL HIGH (ref 4.0–10.5)
nRBC: 0 % (ref 0.0–0.2)

## 2018-06-16 LAB — BASIC METABOLIC PANEL
Anion gap: 13 (ref 5–15)
BUN: 6 mg/dL (ref 6–20)
CHLORIDE: 102 mmol/L (ref 98–111)
CO2: 21 mmol/L — ABNORMAL LOW (ref 22–32)
CREATININE: 0.85 mg/dL (ref 0.61–1.24)
Calcium: 8 mg/dL — ABNORMAL LOW (ref 8.9–10.3)
GFR calc Af Amer: >60 mL/min (ref >60)
GFR calc non Af Amer: >60 mL/min (ref >60)
Glucose, Bld: 94 mg/dL (ref 70–99)
Potassium: 3 mmol/L — ABNORMAL LOW (ref 3.5–5.1)
SODIUM: 136 mmol/L (ref 135–145)

## 2018-06-16 LAB — LIPID PANEL
Cholesterol: 119 mg/dL (ref 0–200)
HDL: 35 mg/dL — ABNORMAL LOW (ref >40)
LDL Cholesterol: 71 mg/dL (ref 0–99)
Total CHOL/HDL Ratio: 3.4 RATIO
Triglycerides: 66 mg/dL (ref <150)
VLDL: 13 mg/dL (ref 0–40)

## 2018-06-16 MED ORDER — POTASSIUM CHLORIDE CRYS ER 20 MEQ PO TBCR
40.0000 meq | EXTENDED_RELEASE_TABLET | ORAL | Status: AC
  Administered 2018-06-16 (×2): 40 meq via ORAL
  Filled 2018-06-16 (×2): qty 2

## 2018-06-16 MED ORDER — PIPERACILLIN-TAZOBACTAM 3.375 G IVPB
3.3750 g | Freq: Three times a day (TID) | INTRAVENOUS | Status: DC
  Administered 2018-06-16 – 2018-06-18 (×6): 3.375 g via INTRAVENOUS
  Filled 2018-06-16 (×6): qty 50

## 2018-06-16 NOTE — Plan of Care (Signed)
  Problem: Nutrition: Goal: Adequate nutrition will be maintained Outcome: Progressing   Problem: Elimination: Goal: Will not experience complications related to urinary retention Outcome: Progressing   

## 2018-06-16 NOTE — Progress Notes (Signed)
Occupational Therapy Treatment Patient Details Name: [name] MRN: [id number] DOB: 05/28/1965 Today's Date: 06/16/2018    History of present illness [name] is a 53 y.o. male presenting with fever, chills, dry cough and malaise concerning for UTI . PMH is significant for CVA w/ residual rightsided weakness, hypothyroidism, tachycardia, lymphedema, HLD. Found to have sepsis likely due to UTI   OT comments  This 53 yo male admitted with above presents to acute OT with splint adjusted and refitted to see if he can tolerate it today for about an hour without thumb component for now.  Follow Up Recommendations  SNF;Supervision/Assistance - 24 hour    Equipment Recommendations  None recommended by OT       Precautions / Restrictions Precautions Precautions: Fall Precaution Comments: RUE contractures, spasms (pt reports they do not hurt) Restrictions Weight Bearing Restrictions: No       Mobility Bed Mobility                  Transfers                          ADL either performed or assessed with clinical judgement                        Exercises Other Exercises Other Exercises: Placed RUE resting hand splint on pt after making some adjustments at webspace (narrowing that part of splint). Still seems like it is too big of a stretch so have left thumb part out for now and am just looking at putting the fingers in more a stretch. Will recheck splint in about an hour.            Frequency  Min 2X/week        Progress Toward Goals  OT Goals(current goals can now be found in the care plan section)  Progress towards OT goals: (will know more after splint re-checked)     Plan Discharge plan remains appropriate       AM-PAC OT "6 Clicks" Daily Activity     Outcome Measure   Help from another person eating meals?: A Little Help from another person taking care of personal grooming?: A Little Help from another person  toileting, which includes using toliet, bedpan, or urinal?: Total Help from another person bathing (including washing, rinsing, drying)?: A Lot Help from another person to put on and taking off regular upper body clothing?: A Lot Help from another person to put on and taking off regular lower body clothing?: Total 6 Click Score: 12    End of Session    OT Visit Diagnosis: Other abnormalities of gait and mobility (R26.89);Muscle weakness (generalized) (M62.81);Hemiplegia and hemiparesis Hemiplegia - Right/Left: Right Hemiplegia - dominant/non-dominant: Dominant Hemiplegia - caused by: Cerebral infarction   Activity Tolerance Patient tolerated treatment well(donning of splint without thumb component for now)   Patient Left in chair;with call bell/phone within reach   Nurse Communication (I placed splint)        Time: 6629-4765 OT Time Calculation (min): 16 min  Charges: OT General Charges $OT Visit: 1 Visit OT Treatments $Orthotics/Prosthetics Check: 8-22 mins  Ignacia Palma, OTR/L Acute Rehab Services Pager [phone] Office [phone]      Evette Georges 06/16/2018, 12:12 PM

## 2018-06-16 NOTE — Progress Notes (Signed)
Occupational Therapy Treatment Patient Details Name: [name] MRN: [id number] DOB: 1965/12/06 Today's Date: 06/16/2018    History of present illness [name] is a 53 y.o. male presenting with fever, chills, dry cough and malaise concerning for UTI . PMH is significant for CVA w/ residual rightsided weakness, hypothyroidism, tachycardia, lymphedema, HLD. Found to have sepsis likely due to UTI   OT comments  This 53 yo male seen today for 3rd time to continue to assess fit for RUE resting hand splint. Tolerated all digits in splint for an hour without pain or redness. Pt will continue to benefit from acute OT to continue to assess increased tolerance of time worn of splint and other basic ADLs.  Follow Up Recommendations  SNF;Supervision/Assistance - 24 hour    Equipment Recommendations  None recommended by OT       Precautions / Restrictions Precautions Precautions: Fall Precaution Comments: RUE contractures, spasms (pt reports they do not hurt) Restrictions Weight Bearing Restrictions: No                                     Exercises Other Exercises  Other Exercises: Pt tolerated RUE resting hand splint (all digits in splint) x1 hour without pain upon removing splint. No reddened areas noted. Will try a 2 hour period of splint wear tomorrow and if pt tolerates this then will set up wearing schedule. Once splint was removed had pt work on A/AA/resisitive with stretch of digits and elbow with pt tolerating all and increased PROM of digits and elbow.            Frequency  Min 2X/week        Progress Toward Goals  OT Goals(current goals can now be found in the care plan section)  Progress towards OT goals: Progressing toward goals     Plan Discharge plan remains appropriate       AM-PAC OT "6 Clicks" Daily Activity     Outcome Measure   Help from another person eating meals?: A Little Help from another person taking care of  personal grooming?: A Little Help from another person toileting, which includes using toliet, bedpan, or urinal?: Total Help from another person bathing (including washing, rinsing, drying)?: A Lot Help from another person to put on and taking off regular upper body clothing?: A Lot Help from another person to put on and taking off regular lower body clothing?: Total 6 Click Score: 12    End of Session    OT Visit Diagnosis: Other abnormalities of gait and mobility (R26.89);Muscle weakness (generalized) (M62.81);Hemiplegia and hemiparesis Hemiplegia - Right/Left: Right Hemiplegia - dominant/non-dominant: Dominant Hemiplegia - caused by: Cerebral infarction   Activity Tolerance Patient tolerated treatment well   Patient Left in bed   Nurse Communication (splint off now and OT will continue to assess wear tolerance tomorrow)        Time: 5374-8270 OT Time Calculation (min): 13 min  Charges: OT General Charges $OT Visit: 1 Visit OT Treatments $Self Care/Home Management : 8-22 mins $Orthotics/Prosthetics Check: 8-22 mins  Ignacia Palma, OTR/L Acute Rehab Services Pager [phone] Office [phone]      Evette Georges 06/16/2018, 3:03 PM

## 2018-06-16 NOTE — Progress Notes (Signed)
Occupational Therapy Treatment Patient Details Name: [name] MRN: [id number] DOB: 12/16/1965 Today's Date: 06/16/2018    History of present illness Joshua ClampRonald E Biggins is a [age] y.o. male presenting with fever, chills, dry cough and malaise concerning for UTI . PMH is significant for CVA w/ residual rightsided weakness, hypothyroidism, tachycardia, lymphedema, HLD. Found to have sepsis likely due to UTI   OT comments  This 53 yo male admitted with above presents to acute OT with tolerating at present RUE splint more today than yesterday with adjustments made to thumb component of splint by closing and rotating webspace component. We will continue to follow.   Follow Up Recommendations  SNF;Supervision/Assistance - 24 hour    Equipment Recommendations  None recommended by OT       Precautions / Restrictions Precautions Precautions: Fall Precaution Comments: RUE contractures, spasms (pt reports they do not hurt) Restrictions Weight Bearing Restrictions: No       Mobility Bed Mobility Overal bed mobility: Needs Assistance Bed Mobility: Rolling Rolling: Mod assist;Max assist         General bed mobility comments: rolling Mod A to right, max A to left  Transfers                 General transfer comment: Pt had been sitting up in recliner since we got him up yesterday refusing to get back to bed. I spoke with pt and explained that he had to get back into bed because it was not good for his bottom to be sitting that long because of possible pressure sores. He agreed to get back to bed and the NT came in and we got him back to bed with maxi move    Balance                                           ADL either performed or assessed with clinical judgement                        Exercises Other Exercises: Came back to check RUE splint. Pt tolerating well with only 2-5 digits in and thumb out, minimal pain at end range of MCP extension.  Thumb web space still too wide so made additonal adjustments to webspace on splint by making a slit in splint and rotating thumb piece of splint more towards 2nd digit where as before it was rotated more towards 3/4 digit. Pt now tolerating thumb being in splint as well as digits 2-5. Will recheck splint again later today to see tolerance with all digits in splint.               Frequency  Min 2X/week        Progress Toward Goals  OT Goals(current goals can now be found in the care plan section)  Progress towards OT goals: Progressing toward goals     Plan Discharge plan remains appropriate       AM-PAC OT "6 Clicks" Daily Activity     Outcome Measure   Help from another person eating meals?: A Little Help from another person taking care of personal grooming?: A Little Help from another person toileting, which includes using toliet, bedpan, or urinal?: Total Help from another person bathing (including washing, rinsing, drying)?: A Lot Help from another person to put on and taking off regular upper body clothing?:  A Lot Help from another person to put on and taking off regular lower body clothing?: Total 6 Click Score: 12    End of Session    OT Visit Diagnosis: Other abnormalities of gait and mobility (R26.89);Muscle weakness (generalized) (M62.81);Hemiplegia and hemiparesis Hemiplegia - Right/Left: Right Hemiplegia - dominant/non-dominant: Dominant Hemiplegia - caused by: Cerebral infarction   Activity Tolerance Patient tolerated treatment well   Patient Left in bed   Nurse Communication (splint back on and i will monitor)        Time: 6644-0347 OT Time Calculation (min): 38 min  Charges: OT General Charges $OT Visit: 1 Visit OT Treatments $Self Care/Home Management : 8-22 mins $Orthotics/Prosthetics Check: 23-37 mins .Ignacia Palma, OTR/L Acute Rehab Services Pager [phone] Office [phone]     Evette Georges 06/16/2018, 1:53  PM

## 2018-06-16 NOTE — Progress Notes (Signed)
Family Medicine Teaching Service Daily Progress Note Intern Pager: [phone]  Patient name: [name] Medical record number: [id number] Date of birth: 09/18/1965 Age: 53 y.o. Gender: male  Primary Care Provider: Garnette Gunnerhompson, Aaron B, MD Consultants: SW Code Status: Full Code   Pt Overview and Major Events to Date:  Hospital Day: 3  Admitted: 06/14/2018 for CC: Weakness   Assessment and Plan: Joshua ClampRonald E Burleson is a 53 y.o. male admitted for sepsis and found to have UTI with possible developing pneumonia.  His chronic conditions include hypothyroidism, chronic indwelling Foley, hyperlipidemia, chronic bilateral lymphedema, hypokalemia  Active Problems:   Dyslipidemia   Obstructive sleep apnea   Essential hypertension   GERD   Chronic acquired lymphedema   Morbid obesity (HCC)   Sepsis (HCC)   Lives in long-term care facility   CVA, old, right hemiparesis (HCC)   Lymphedema of both lower extremities   Possible Catheter-associated urinary tract infection (HCC)   Contracture of elbow joint, right   # Sepsis On admission, patient with lactic acid of 1.3, white count 13.5, tachycardiac to low 100s, and febrile.  Patient's vital signs have been grossly stable with exception of continued fevers last of which was 100.5 at 0100.  Otherwise, respiratory rate sats, blood pressures remain within normal limits.  Patient continues to have fevers up to 103.5 yesterday, patient was status post ceftriaxone x1 and transition to cefepime and chest x-ray was obtained which showed a left lower lobe infiltrate concerning for focal pneumonia.  This morning, his antibiotic coverage was broadened to cover for hospital-acquired pneumonia as initial chest x-ray was negative acute change with repeat chest x-ray.  This morning, his white count is hovering just above normal at 11.8 from 11.4 yesterday.  Blood cultures negative x2 days.  UA and urine culture repeated as admission UA with too many species.   Repeat UA with ketones 20 leukocytes.  Continue to monitor vital signs and fevers  Continue Zosyn  Consider broadening coverage with continued fevers or continued white count  Consider repeat blood culture with continued fevers  AM CBC  #UTI Patient refuses to replace Foley.  He refuses even with offering pain medication for switch.  He is not altered and has decision-making capacity.  Patient is aware that without replacement of Foley, infection can persist despite IV antibiotic therapy.  Patient reports that he has had an indwelling Foley catheter for several years now.  It was originally placed because he had difficulty ambulating and neurologic issues.  Per nursing note, Foley is replaced every month.  Per chart review, patient has had several UTIs.  Patient may benefit from suprapubic cath as he currently resides in long-term assisted care.  Patient is initially hesitant about suprapubic cath but would like more information.  Continue Zosyn as above  Follow urine cultures  Discussed with patient further replacing cath  Consult urology   #HCAP  Currently not requiring oxygen.  Chest x-ray on 2/2 at 1800 shows no acute pulmonary process.  Repeat chest x-ray on 2/3 at 2100 shows right middle lobe infiltrate concerning for pneumonia.  In setting of continued fevers, will treat empirically with Zosyn as above.  On admission, MRSA negative.   Incentive spiromety q 1 hour  Continue to monitor fevers  #Decreased p.o. intake Patient eating 25 to 75% of his meals.  P.o. intake is low per charting.  Small ketones on second UA  Strict I's and O's  Encourage p.o. fluids  Consider fluid bolus if concern for  dehydration  Acute anemia On admission, hemoglobin is 12.8, down to 11.1 today.  Likely iatrogenic.  We will continue to monitor as MCV, MCH CR stable within normal limits.  A.m. CBC  #Hx Stroke   PT, OT recommend SNF  Continue ASA 81, 10 mg atorvastatin  #HLD  Lipid  panel today.  Low HDL.  Continue 10 mg statin  # HYPOTHYROIDISM  Continue levothyroxine 25 mcg  Follow-up TSH in 4 to 6 weeks as outpatient  #Chronic lymphedema bilateral Holding home torsemide in setting of hypokalemia.  Should consider other lymphedema treatments at discharge.  #Hypokalemia Likely due to home torsemide without potassium replacement  Continue to replete potassium  A.m. BMP  #Constipation  Continue home on MiraLAX daily, senna twice daily  Future labs: AM CBC and BMP Fluids: None Electrolytes: Replete PRN Disposition: Discharge to LTAC when stable and afebrile   Medications: Scheduled Meds: . aspirin EC  81 mg Oral Daily  . atorvastatin  10 mg Oral QHS  . cholecalciferol  5,000 Units Oral Daily  . enoxaparin (LOVENOX) injection  40 mg Subcutaneous Daily  . levothyroxine  37.5 mcg Oral Q0600  . polyethylene glycol  17 g Oral Daily  . potassium chloride  40 mEq Oral Q4H  . senna  1 tablet Oral BID  . tamsulosin  0.4 mg Oral q1800   Continuous Infusions: . piperacillin-tazobactam (ZOSYN)  IV     PRN Meds: acetaminophen **OR** acetaminophen, ondansetron **OR** ondansetron (ZOFRAN) IV  Subjective  Patient reports doing well overnight.  He is more talkative this morning but is adamant that he does not want to replace his Foley.  Objective   Vital Signs Intake/Output  Temp:  [99.3 F (37.4 C)-103.1 F (39.5 C)] 99.3 F (37.4 C) (02/04 0807) Pulse Rate:  [70-117] 70 (02/04 0807) Resp:  [17-23] 18 (02/04 0807) BP: (127-152)/(71-102) 133/89 (02/04 0807) SpO2:  [94 %-100 %] 94 % (02/04 0807)  Intake/Output      02/03 0701 - 02/04 0700 02/04 0701 - 02/05 0700   P.O. 240 100   I.V. 1219.3    IV Piggyback 57.1    Total Intake 1516.3 100   Urine 1700    Total Output 1700    Net -183.7 +100           Patient Vitals for the past 24 hrs:  BP Temp Temp src Pulse Resp SpO2  06/16/18 0807 133/89 99.3 F (37.4 C) Oral 70 18 94 %  06/16/18 0657  129/86 100.2 F (37.9 C) Oral 95 18 96 %  06/16/18 0145 (!) 142/77 (!) 100.5 F (38.1 C) Oral (!) 117 18 95 %  06/15/18 2245 134/86 (!) 100.6 F (38.1 C) Oral 99 19 100 %  06/15/18 1954 128/71 100.3 F (37.9 C) Oral 93 17 94 %  06/15/18 1729 - (!) 103.1 F (39.5 C) Axillary - - -  06/15/18 1728 (!) 152/102 (!) 103.1 F (39.5 C) - (!) 107 (!) 23 -  06/15/18 1303 127/83 99.3 F (37.4 C) Oral 91 19 98 %  06/15/18 1204 - (!) 101.2 F (38.4 C) Oral - - -     Physical Exam  Gen: NAD, alert, non-toxic, well-appearing, sitting comfortably in chair Skin: Warm and dry.  Not diaphoretic. HEENT: NCAT.  MMM.  CV: RRR.  Normal S1-S2.  Bilateral lower extremity edema, nonpitting Resp: CTAB. No increased WOB.  Unable to appreciate posterior lungs due to patient positioning Abd: NTND on palpation to all 4 quadrants.  Pos  bowel sounds Extremities: Warm and well perfused.   Laboratory: Recent Labs  Lab 06/14/18 1653 06/15/18 0229 06/16/18 0316  WBC 13.5* 11.4* 11.8*  HGB 12.8* 11.9* 11.1*  HCT 38.4* 35.6* 33.8*  PLT 362 254 278   Recent Labs  Lab 06/14/18 1653 06/15/18 0229 06/16/18 0316  NA 136 141 136  K 2.7* 3.0* 3.0*  CL 95* 106 102  CO2 29 24 21*  BUN 5* 7 6  CREATININE 0.84 0.92 0.85  CALCIUM 8.7* 7.8* 8.0*  PROT 7.9  --   --   BILITOT 0.9  --   --   ALKPHOS 90  --   --   ALT 13  --   --   AST 14*  --   --   GLUCOSE 129* 106* 94    Imaging/Diagnostic Tests: Dg Chest 2 View  Result Date: 06/14/2018 CLINICAL DATA:  Fever and tachycardia. EXAM: CHEST - 2 VIEW COMPARISON:  04/02/2017 FINDINGS: Cardiac silhouette is normal in size. No mediastinal or hilar masses. No evidence of adenopathy. Clear lungs.  No pleural effusion or pneumothorax. Skeletal structures are intact. IMPRESSION: No active cardiopulmonary disease. Electronically Signed   By: Amie Portland M.D.   On: 06/14/2018 18:35   Dg Chest Port 1 View  Result Date: 06/15/2018 CLINICAL DATA:  Fever and weakness  today. EXAM: PORTABLE CHEST 1 VIEW COMPARISON:  06/14/2018 FINDINGS: Shallow inspiration. Cardiac enlargement. Atelectasis in the lung bases. Suggestion of a developing interstitial pattern to the central right lung. This may be artifact or could indicate developing pneumonitis. Follow-up as clinically indicated. No blunting of costophrenic angles. No pneumothorax. Mediastinal contours appear intact. IMPRESSION: Cardiac enlargement. Shallow inspiration with atelectasis in the lung bases. Suggestion of developing interstitial pattern to the central right lung. Electronically Signed   By: Burman Nieves M.D.   On: 06/15/2018 22:16        Melene Plan, MD 06/16/2018, 11:42 AM PGY-1, [facility] Family Medicine FPTS Intern pager: [phone], text pages welcome

## 2018-06-16 NOTE — Social Work (Signed)
[name] aware pt is from [facility].  [name] continuing to follow for support with disposition when medically appropriate.  Octavio Graves, MSW, [facility] Clinical Social Work [phone]

## 2018-06-16 NOTE — [place] FL2 (Signed)
[place] MEDICAID FL2 LEVEL OF CARE SCREENING TOOL     IDENTIFICATION  Patient Name: [name] Birthdate: 1966-05-02 Sex: male Admission Date (Current Location): 06/14/2018  [facility] and [place] Number:  Producer, television/film/video and Address:  [facility] [place], [address], [place], [place] [address]      Provider Number: [id number]  Attending Physician Name and Address:  Latrelle Dodrill, MD  Relative Name and Phone Number:       Current Level of Care: Hospital Recommended Level of Care: Skilled Nursing Facility Prior Approval Number:    Date Approved/Denied:   PASRR Number: [id number]  Discharge Plan: SNF    Current Diagnoses: Patient Active Problem List   Diagnosis Date Noted  . Acute cystitis without hematuria   . Lives in long-term care facility 04/03/2017  . CVA, old, right hemiparesis (HCC) 04/03/2017  . Lymphedema of both lower extremities 04/03/2017  . Possible Catheter-associated urinary tract infection (HCC) 04/03/2017  . Contracture of elbow joint, right 04/03/2017  . Fever   . Sepsis (HCC)   . Morbid obesity (HCC) 03/09/2016  . Chronic acquired lymphedema 08/08/2012  . Obstructive sleep apnea 06/08/2009  . Dyslipidemia 01/17/2009  . GERD 12/08/2008  . Essential hypertension 07/16/2007    Orientation RESPIRATION BLADDER Height & Weight     Self, Place, Situation  Normal Incontinent, Indwelling catheter(chronic foley) Weight:   Height:     BEHAVIORAL SYMPTOMS/MOOD NEUROLOGICAL BOWEL NUTRITION STATUS      Incontinent Diet(see discharge summary)  AMBULATORY STATUS COMMUNICATION OF NEEDS Skin   Extensive Assist Verbally Normal(dry; flaky)                       Personal Care Assistance Level of Assistance  Bathing, Feeding, Dressing Bathing Assistance: Maximum assistance Feeding assistance: Independent Dressing Assistance: Maximum assistance     Functional Limitations Info  Sight, Speech, Hearing Sight  Info: Adequate Hearing Info: Adequate Speech Info: Impaired    SPECIAL CARE FACTORS FREQUENCY                       Contractures Contractures Info: Present(RUE; frozen shoulder)    Additional Factors Info  Code Status, Allergies Code Status Info: Full Code Allergies Info: No Known Allergies           Current Medications (06/16/2018):  This is the current hospital active medication list Current Facility-Administered Medications  Medication Dose Route Frequency Provider Last Rate Last Dose  . acetaminophen (TYLENOL) tablet 650 mg  650 mg Oral Q6H PRN Ellwood Dense, DO   650 mg at 06/15/18 1731   Or  . acetaminophen (TYLENOL) suppository 650 mg  650 mg Rectal Q6H PRN Ellwood Dense, DO      . aspirin EC tablet 81 mg  81 mg Oral Daily Ellwood Dense, DO   81 mg at 06/16/18 0847  . atorvastatin (LIPITOR) tablet 10 mg  10 mg Oral QHS Rumball, Alison, DO      . cholecalciferol (VITAMIN D3) tablet 5,000 Units  5,000 Units Oral Daily Ellwood Dense, DO   5,000 Units at 06/16/18 0845  . enoxaparin (LOVENOX) injection 40 mg  40 mg Subcutaneous Daily Ellwood Dense, DO   40 mg at 06/16/18 0848  . levothyroxine (SYNTHROID, LEVOTHROID) tablet 37.5 mcg  37.5 mcg Oral Q0600 Garnette Gunner, MD   37.5 mcg at 06/16/18 0846  . ondansetron (ZOFRAN) tablet 4 mg  4 mg Oral Q6H PRN Rumball,  Jill SideAlison, DO       Or  . ondansetron [facility](ZOFRAN) injection 4 mg  4 mg Intravenous Q6H PRN Ellwood Denseumball, Alison, DO      . piperacillin-tazobactam (ZOSYN) IVPB 3.375 g  3.375 g Intravenous Q8H Melene PlanKim, Rachel E, MD 12.5 mL/hr at 06/16/18 1403 3.375 g at 06/16/18 1403  . polyethylene glycol (MIRALAX / GLYCOLAX) packet 17 g  17 g Oral Daily Ellwood DenseRumball, Alison, DO   17 g at 06/16/18 0847  . senna (SENOKOT) tablet 8.6 mg  1 tablet Oral BID Ellwood Denseumball, Alison, DO   8.6 mg at 06/16/18 0846  . tamsulosin (FLOMAX) capsule 0.4 mg  0.4 mg Oral q1800 Ellwood DenseRumball, Alison, DO   0.4 mg at 06/15/18 1724     Discharge Medications: Please  see discharge summary for a list of discharge medications.  Relevant Imaging Results:  Relevant Lab Results:   Additional Information SS#: [id number]  Doy HutchingIsabel H Peniel Biel, LCSWA

## 2018-06-16 NOTE — Progress Notes (Signed)
He denies any concern today, no acute findings on physical exams. He remains febrile despite antibiotic coverage. He will likely do well if foley is removed and replace. Continue IV antibiotics. Urine and blood culture reviewed. Repeat blood culture if fever persists.  He is otherwise hemodynamically stable.  I will cosign the resident's note once it is completed.

## 2018-06-17 DIAGNOSIS — I69359 Hemiplegia and hemiparesis following cerebral infarction affecting unspecified side: Secondary | ICD-10-CM

## 2018-06-17 DIAGNOSIS — M24521 Contracture, right elbow: Secondary | ICD-10-CM

## 2018-06-17 DIAGNOSIS — I890 Lymphedema, not elsewhere classified: Secondary | ICD-10-CM

## 2018-06-17 LAB — BASIC METABOLIC PANEL
Anion gap: 7 (ref 5–15)
BUN: 6 mg/dL (ref 6–20)
CO2: 25 mmol/L (ref 22–32)
Calcium: 8.2 mg/dL — ABNORMAL LOW (ref 8.9–10.3)
Chloride: 105 mmol/L (ref 98–111)
Creatinine, Ser: 0.88 mg/dL (ref 0.61–1.24)
GFR calc non Af Amer: >60 mL/min (ref >60)
Glucose, Bld: 96 mg/dL (ref 70–99)
POTASSIUM: 3.3 mmol/L — AB (ref 3.5–5.1)
Sodium: 137 mmol/L (ref 135–145)

## 2018-06-17 LAB — CBC
HCT: 30.7 % — ABNORMAL LOW (ref 39.0–52.0)
Hemoglobin: 10 g/dL — ABNORMAL LOW (ref 13.0–17.0)
MCH: 26.9 pg (ref 26.0–34.0)
MCHC: 32.6 g/dL (ref 30.0–36.0)
MCV: 82.5 fL (ref 80.0–100.0)
Platelets: 270 10*3/uL (ref 150–400)
RBC: 3.72 MIL/uL — AB (ref 4.22–5.81)
RDW: 14.9 % (ref 11.5–15.5)
WBC: 8.9 10*3/uL (ref 4.0–10.5)
nRBC: 0 % (ref 0.0–0.2)

## 2018-06-17 MED ORDER — POTASSIUM CHLORIDE CRYS ER 20 MEQ PO TBCR
40.0000 meq | EXTENDED_RELEASE_TABLET | Freq: Once | ORAL | Status: AC
  Administered 2018-06-17: 40 meq via ORAL
  Filled 2018-06-17: qty 2

## 2018-06-17 NOTE — Progress Notes (Signed)
Occupational Therapy Treatment Patient Details Name: [name] MRN: [id number] DOB: 09/18/1965 Today's Date: 06/17/2018    History of present illness [name] is a 53 y.o. male presenting with fever, chills, dry cough and malaise concerning for UTI . PMH is significant for CVA w/ residual rightsided weakness, hypothyroidism, tachycardia, lymphedema, HLD. Found to have sepsis likely due to UTI   OT comments  Pt currently in splint for next 2 hours to assess skin with continued prolonged wear time. Recommend that MD write order for SNF that requires RN/ CNA address splint needs daily. Pt reports previous admission with decr (A) with don/ doff splint and decr wear time as a result. Pt currently able to self feed with setup of tray.    Follow Up Recommendations  SNF;Supervision/Assistance - 24 hour    Equipment Recommendations  None recommended by OT    Recommendations for Other Services      Precautions / Restrictions Precautions Precautions: Fall Precaution Comments: RUE contractures, spasms (pt reports they do not hurt)       Mobility Bed Mobility Overal bed mobility: Needs Assistance             General bed mobility comments: total +2 total (A) to slid to [facility] prior to eating.   Transfers                      Balance                                           ADL either performed or assessed with clinical judgement   ADL Overall ADL's : Needs assistance/impaired Eating/Feeding: Set up Eating/Feeding Details (indicate cue type and reason): pt able to self feed with fork L hand this AM with repositioning in the bed and HOB elevated. OT opening all containers and setting up tray to maximize indep.                                    General ADL Comments: pt provided PROM of all digits, wrist prior to splint application. pt noted to have tone at wrist and limited flexion.      Vision       Perception      Praxis      Cognition Arousal/Alertness: Awake/alert Behavior During Therapy: WFL for tasks assessed/performed Overall Cognitive Status: Within Functional Limits for tasks assessed                                          Exercises Other Exercises Other Exercises: placed R UE in splint and to check skin in 2 hours   Shoulder Instructions       General Comments noted to have very dry flaky skin on R hand. OT to help address skin next session.     Pertinent Vitals/ Pain          Home Living                                          Prior Functioning/Environment  Frequency  Min 2X/week        Progress Toward Goals  OT Goals(current goals can now be found in the care plan section)  Progress towards OT goals: Progressing toward goals  Acute Rehab OT Goals Patient Stated Goal: to eat breakfast OT Goal Formulation: With patient Time For Goal Achievement: 06/29/18 Potential to Achieve Goals: Good ADL Goals Additional ADL Goal #1: Pt will be able to roll right with min A to A with basic ADLs Additional ADL Goal #2: Pt will be able to sit EOB for up to 5 minutes with no more than min A Additional ADL Goal #3: Pt will tolerate wearing of RUE splint and be able to director others in how to donn and doff it for him  Plan Discharge plan remains appropriate    Co-evaluation                 AM-PAC OT "6 Clicks" Daily Activity     Outcome Measure   Help from another person eating meals?: A Little Help from another person taking care of personal grooming?: A Little Help from another person toileting, which includes using toliet, bedpan, or urinal?: Total Help from another person bathing (including washing, rinsing, drying)?: A Lot Help from another person to put on and taking off regular upper body clothing?: A Lot Help from another person to put on and taking off regular lower body clothing?: Total 6 Click Score:  12    End of Session    OT Visit Diagnosis: Other abnormalities of gait and mobility (R26.89);Muscle weakness (generalized) (M62.81);Hemiplegia and hemiparesis Hemiplegia - Right/Left: Right Hemiplegia - dominant/non-dominant: Dominant Hemiplegia - caused by: Cerebral infarction   Activity Tolerance Patient tolerated treatment well   Patient Left in bed   Nurse Communication Mobility status;Precautions;Need for lift equipment        Time: [id number] OT Time Calculation (min): 17 min  Charges: OT General Charges $OT Visit: 1 Visit OT Treatments $Therapeutic Activity: 8-22 mins   Mateo FlowBrynn [place], OTR/L  Acute Rehabilitation Services Pager: [phone] Office: [phone] .    Mateo FlowBrynn Jaquari Reckner 06/17/2018, 9:08 AM

## 2018-06-17 NOTE — Discharge Summary (Addendum)
Family Medicine Teaching [facility] Discharge Summary  Patient name: Joshua ClampRonald E Mccormick Medical record number: [id number] Date of birth: 08/26/1965 Age: 53 y.o. Gender: male Date of Admission: 06/14/2018  Date of Discharge: 06/19/18 Admitting Physician: Latrelle DodrillBrittany J McIntyre, MD  Primary Care Provider: Garnette Gunnerhompson, Aaron B, MD Consultants: none   Indication for Hospitalization: sepsis  Discharge Diagnoses/Problem List:  Active Problems:   Dyslipidemia   Obstructive sleep apnea   Essential hypertension   GERD   Chronic acquired lymphedema   Morbid obesity (HCC)   Fever   Sepsis (HCC)   Lives in long-term care facility   CVA, old, right hemiparesis (HCC)   Lymphedema of both lower extremities   Possible Catheter-associated urinary tract infection (HCC)   Contracture of elbow joint, right   Acute cystitis without hematuria  Disposition: SNF  Discharge Condition: stable  Discharge Exam:  BP (!) 148/88 (BP Location: Left Arm)   Pulse 85   Temp 98.2 F (36.8 C) (Oral)   Resp 18   Wt 119 kg   SpO2 99%   BMI 39.89 kg/m  Gen: NAD, alert, non-toxic, well-appearing, sitting comfortably eating breakfast  Skin: Warm and dry HEENT: NCAT.  MMM.  CV: RRR.  Normal S1-S2. No BLEE. Resp: continued productive cough. Lung sounds difficult to appreciate anteriorly and posteriorly due to body habitus and position Abd: NTND on palpation to all 4 quadrants.  Pos bowel sounds Extremities: moves extremities spontaneously. Warm and well perfused.   Brief Hospital Course:  Patient was brought to ED for fever and chills.  In the ED he was febrile, tachycardic, and had a leukocytosis. He was given two 1L NS boluses and started on CTX after urine and blood cultuers were drawn. Patient had a chronic indwelling catheter that was believed to be the source of his infection.  Urinalysis showed LE but no nitrites and few bacteria.  The first urine culture was polymicrobial, but the second urine culture  grew out 100k pseudomonas sensitive to ciprofloxacin.  Blood cultures were no growth to date at discharge.  Patient was initially started on ceftriaxone for UTI and changed to cefepime after continued fever.  Patient remained febrile and repeat chest x-ray showed possible middle lobe infiltrate concerning for pneumonia versus atelectasis and was started on Zosyn for both urinary and respiratory coverage.  Patient's fever curve normalized and white count decreased.  Prior to discharge, patient agreed to change Foley catheter, which he had refused to do since admission. Urology was called to place cath given the need for a specially sized tubing. He was given fentanyl for pain and foley was placed.  He was transitioned to p.o. ciprofloxacin to finish a total 7-day antibiotic course including initial Zosyn doses.   Issues for Follow Up:  1. During his admission, patient's TSH returned low and levo was decreased to 37.5 mcg. Please follow up TSH in 4-6 weeks.  2. For chronic lymphedema, patient's torsemide was discontinued in the setting of hypokalemia which was repleted over [facility]. Placed unna boots bilaterally prior to discharge. Please continue to maintain.  1. Patient was not restarted on torsemide at discharge. Per chart review, was started in 2017 during hospital stay for chronic lymphedema and continued in the setting of comorbid hypertension. In 2018, coreg and lisinopril were peeled off during hospital admission for hypertension. If patient hypertensive, consider restarted HTN therapy.  3. Please check potassium. Was 2.7 at admission while on torsemide. Repleted to 3.4 with oral KDUR.  4. Acute  anemia, hemoglobin at discharge was 10.2. Please recheck hemoglobin within one week of discharge.  5. Follow up with Dr. Sherron Monday at [facility] in 3-4 weeks for foley change  Significant Procedures: none  Significant Labs and Imaging:  Recent Labs  Lab 06/16/18 0316 06/17/18 0258  06/18/18 0315  WBC 11.8* 8.9 7.3  HGB 11.1* 10.0* 10.2*  HCT 33.8* 30.7* 31.0*  PLT 278 270 286   Recent Labs  Lab 06/14/18 1653 06/15/18 0229 06/16/18 0316 06/17/18 0258 06/18/18 0315  NA 136 141 136 137 139  K 2.7* 3.0* 3.0* 3.3* 3.4*  CL 95* 106 102 105 105  CO2 29 24 21* 25 23  GLUCOSE 129* 106* 94 96 122*  BUN 5* 7 6 6  5*  CREATININE 0.84 0.92 0.85 0.88 0.73  CALCIUM 8.7* 7.8* 8.0* 8.2* 8.2*  MG  --  1.8  --   --   --   ALKPHOS 90  --   --   --   --   AST 14*  --   --   --   --   ALT 13  --   --   --   --   ALBUMIN 3.3*  --   --   --   --      Dg Chest 2 View  Result Date: 06/14/2018 CLINICAL DATA:  Fever and tachycardia. EXAM: CHEST - 2 VIEW COMPARISON:  04/02/2017 FINDINGS: Cardiac silhouette is normal in size. No mediastinal or hilar masses. No evidence of adenopathy. Clear lungs.  No pleural effusion or pneumothorax. Skeletal structures are intact. IMPRESSION: No active cardiopulmonary disease. Electronically Signed   By: Amie Portland M.D.   On: 06/14/2018 18:35   Dg Chest Port 1 View  Result Date: 06/15/2018 CLINICAL DATA:  Fever and weakness today. EXAM: PORTABLE CHEST 1 VIEW COMPARISON:  06/14/2018 FINDINGS: Shallow inspiration. Cardiac enlargement. Atelectasis in the lung bases. Suggestion of a developing interstitial pattern to the central right lung. This may be artifact or could indicate developing pneumonitis. Follow-up as clinically indicated. No blunting of costophrenic angles. No pneumothorax. Mediastinal contours appear intact. IMPRESSION: Cardiac enlargement. Shallow inspiration with atelectasis in the lung bases. Suggestion of developing interstitial pattern to the central right lung. Electronically Signed   By: Burman Nieves M.D.   On: 06/15/2018 22:16    Results/Tests Pending at Time of Discharge: none  Discharge Medications:  Allergies as of 06/19/2018   No Known Allergies     Medication List    STOP taking these medications   torsemide 20 MG  tablet Commonly known as:  DEMADEX     TAKE these medications   aspirin EC 81 MG tablet Take 81 mg daily by mouth.   atorvastatin 10 MG tablet Commonly known as:  LIPITOR Take 10 mg at bedtime by mouth.   ciprofloxacin 500 MG tablet Commonly known as:  CIPRO Take 1 tablet (500 mg total) by mouth 2 (two) times daily for 4 days.   levothyroxine 75 MCG tablet Commonly known as:  SYNTHROID, LEVOTHROID Take 0.5 tablets (37.5 mcg total) by mouth daily at 6 (six) AM. What changed:    medication strength  how much to take   senna 8.6 MG Tabs tablet Commonly known as:  SENOKOT Take 1 tablet by mouth 2 (two) times daily.   tamsulosin 0.4 MG Caps capsule Commonly known as:  FLOMAX Take 0.4 mg by mouth daily at 6 PM.   Vitamin D 125 MCG (5000 UT) Caps Take 5,000 Units  by mouth daily.       Discharge Instructions: Please refer to Patient Instructions section of EMR for full details.  Patient was counseled important signs and symptoms that should prompt return to medical care, changes in medications, dietary instructions, activity restrictions, and follow up appointments.   Follow-Up Appointments:  Contact information for follow-up providers    Alfredo MartinezMacDiarmid, Scott, MD. Call.   Specialty:  Urology Why:  F/u in 3-4 weeks for foley change  Contact information: [address] [place] [address] [phone]            Contact information for after-discharge care    Dest[facility]    [facility] .   Service:  Skilled Nursing Contact information: [address] [place] [id number] [phone]                   Melene PlanKim, Kanav Kazmierczak E, MD 06/19/2018, 12:16 PM PGY-1, [facility]

## 2018-06-17 NOTE — Clinical Social Work Note (Signed)
Clinical Social Work Assessment  Patient Details  Name: [name] MRN: [id number] Date of Birth: 03-Mar-1966  Date of referral:  06/17/18               Reason for consult:  Discharge Planning                Permission sought to share information with:  Family Supports Permission granted to share information::  Yes, Verbal Permission Granted  Name::     [name].  Agency::  [facility]  Relationship::  father  Contact Information:  [phone]  Housing/Transportation Living arrangements for the past 2 months:  [place] of Information:  Patient Patient Interpreter Needed:  None Criminal Activity/Legal Involvement Pertinent to Current Situation/Hospitalization:  No - Comment as needed Significant Relationships:  Parents, Warehouse manager Lives with:  Self, Facility Resident Do you feel safe going back to the place where you live?  Yes Need for family participation in patient care:  Yes (Comment)  Care giving concerns:  Pt from LTC at [facility], he has had issues with recurrent UTIs and issues related to his catheter. When asked pt expresses no concerns to [place] regarding return to SNF once medically appropriate.   Social Worker assessment / plan:  [name] met with pt at bedside, introduced self, role, and reason for visit. Pt pleasant and alert, confirms his is from [employer]. He has a father and friends that live in the [place] area and they visit him at [facility]. He states no concern with retuning to SNF at discharge, he states that he prefers to not have a roommate and he has been lucky to be in a room with an empty bed, he hopes that he will not have a roommate placed in there when he returns but knows it may be likely.   [name] remains available to support disposition when appropriate.   Employment status:  Disabled (Comment on whether or not currently receiving Disability) Insurance information:  Medicaid In  [place] PT Recommendations:  [place], 24 Hour Supervision Information / Referral to community resources:  [place]  Patient/Family's Response to care:  Pt amenable to speaking with [name], pleasant and alert. He is interested in returning to SNF, requests that his father only be called at discharge, he states he doesn't want to worry him.   Patient/Family's Understanding of and Emotional Response to Diagnosis, Current Treatment, and Prognosis:  Pt states understanding of diagnosis, current treatment and prognosis. Pt appropriate emotionally and pleasant to converse with. He is understanding of his limitations, but remains positive moving forward.  Emotional Assessment Appearance:  Appears stated age Attitude/Demeanor/Rapport:  Engaged, Gracious, Charismatic Affect (typically observed):  Accepting, Adaptable, Appropriate Orientation:  Oriented to Self, Oriented to Place, Oriented to Situation Alcohol / Substance use:  Not Applicable Psych involvement (Current and /or in the community):  No (Comment)  Discharge Needs  Concerns to be addressed:  Care Coordination Readmission within the last 30 days:  No Current discharge risk:  Chronically ill, Dependent with Mobility Barriers to Discharge:  Continued Medical Work up   [employer], [place] 06/17/2018, 1:39 PM

## 2018-06-17 NOTE — Progress Notes (Signed)
Family Medicine Teaching Service Daily Progress Note Intern Pager: [phone]  Patient name: [name] Medical record number: [id number] Date of birth: 1966-03-31 Age: 53 y.o. Gender: male  Primary Care Provider: Garnette Gunner, MD Consultants: SW Code Status: Full Code   Pt Overview and Major Events to Date:  Hospital Day: 4  Admitted: 06/14/2018 for CC: Weakness   Assessment and Plan: [name] is a 53 y.o. male admitted for sepsis and found to have UTI with possible developing pneumonia.  His chronic conditions include hypothyroidism, chronic indwelling Foley, hyperlipidemia, chronic bilateral lymphedema, hypokalemia  # Sepsis  resolved Last fever 100.4 at 1400 on 2/5. VSS and white count 8.9.   Continue to monitor vital signs and fevers   S/p CTX x 1, cefepime x 1   Continue Zosyn  AM CBC  #UTI >100,000 gram negative rods on 2nd culture. Waiting sensitivities.   Continue Zosyn as above  Follow urine cultures  Discussed with patient further replacing cath  Consult urology   #HCAP  Found on second CXR after admission in the setting of continued fevers. Started on Zosyn 2/4.  On admission, MRSA negative. No increased cough or phlegm production. MRSA negative.   Incentive spiromety q 1 hour   Zosyn as above   Continue to monitor fevers  #Decreased p.o. intake  Strict I's and O's  Encourage p.o. fluids  Consider fluid bolus if concern for dehydration  Acute anemia On admission, hemoglobin is 12.8, down to 11.1 today.  Likely iatrogenic.   A.m. CBC  #Hx Stroke   PT, OT recommend SNF  Continue ASA 81, 10 mg atorvastatin  #HLD  Lipid panel - Low HDL, otherwise wnl  Continue 10 mg statin  # HYPOTHYROIDISM  Continue levothyroxine 25 mcg  Follow-up TSH in 4 to 6 weeks as outpatient  #Chronic lymphedema bilateral Holding home torsemide in setting of hypokalemia.  Should consider other lymphedema treatments at  discharge.  #Hypokalemia 3.3 today  Continue to replete potassium  A.m. BMP  #Constipation  Continue home on MiraLAX daily, senna twice daily  Future labs: AM CBC and BMP Fluids: None Electrolytes: Replete PRN Disposition: Discharge to [facility] when stable and afebrile. Will call to see if pt can be accepted back with IV abx.   Medications: Scheduled Meds: . aspirin EC  81 mg Oral Daily  . atorvastatin  10 mg Oral QHS  . cholecalciferol  5,000 Units Oral Daily  . enoxaparin (LOVENOX) injection  40 mg Subcutaneous Daily  . levothyroxine  37.5 mcg Oral Q0600  . polyethylene glycol  17 g Oral Daily  . senna  1 tablet Oral BID  . tamsulosin  0.4 mg Oral q1800   Continuous Infusions: . piperacillin-tazobactam (ZOSYN)  IV 3.375 g (06/17/18 0557)   PRN Meds: acetaminophen **OR** acetaminophen, ondansetron **OR** ondansetron (ZOFRAN) IV  Subjective  Patient reports doing well overnight.  He does not have any pain this morning.   Objective   Vital Signs Intake/Output  Temp:  [99 F (37.2 C)-100.4 F (38 C)] 99 F (37.2 C) (02/05 0503) Pulse Rate:  [70-95] 78 (02/05 0503) Resp:  [16-20] 19 (02/05 0503) BP: (127-133)/(63-89) 128/78 (02/05 0503) SpO2:  [94 %-98 %] 96 % (02/05 0503)  Intake/Output      02/04 0701 - 02/05 0700   P.O. 1160   IV Piggyback 100.5   Total Intake 1260.5   Urine 2200   Stool 0   Total Output 2200   Net -939.5  Stool Occurrence 1 x      Physical Exam:  Gen: NAD, alert, non-toxic, well-appearing, lying in bed comfortably  Skin: Warm and dry. No obvious rashes, lesions, or trauma. HEENT: NCAT No conjunctival pallor or injection. No scleral icterus or injection.  CV: RRR.  Normal S1-S2. RP & DPs 2+ bilaterally. No BLEE Resp: Clear lung sounds, no wheezing or rhonchi. Cough  Abd: NTND on palpation to all 4 quadrants.  Positive bowel sounds. Psych: Cooperative with exam. Pleasant. Makes eye contact. Speech normal.  Laboratory: Recent  Labs  Lab 06/15/18 0229 06/16/18 0316 06/17/18 0258  WBC 11.4* 11.8* 8.9  HGB 11.9* 11.1* 10.0*  HCT 35.6* 33.8* 30.7*  PLT 254 278 270   Recent Labs  Lab 06/14/18 1653 06/15/18 0229 06/16/18 0316 06/17/18 0258  NA 136 141 136 137  K 2.7* 3.0* 3.0* 3.3*  CL 95* 106 102 105  CO2 29 24 21* 25  BUN 5* 7 6 6   CREATININE 0.84 0.92 0.85 0.88  CALCIUM 8.7* 7.8* 8.0* 8.2*  PROT 7.9  --   --   --   BILITOT 0.9  --   --   --   ALKPHOS 90  --   --   --   ALT 13  --   --   --   AST 14*  --   --   --   GLUCOSE 129* 106* 94 96   Imaging/Diagnostic Tests: Dg Chest Port 1 View  Result Date: 06/15/2018 CLINICAL DATA:  Fever and weakness today. EXAM: PORTABLE CHEST 1 VIEW COMPARISON:  06/14/2018 FINDINGS: Shallow inspiration. Cardiac enlargement. Atelectasis in the lung bases. Suggestion of a developing interstitial pattern to the central right lung. This may be artifact or could indicate developing pneumonitis. Follow-up as clinically indicated. No blunting of costophrenic angles. No pneumothorax. Mediastinal contours appear intact. IMPRESSION: Cardiac enlargement. Shallow inspiration with atelectasis in the lung bases. Suggestion of developing interstitial pattern to the central right lung. Electronically Signed   By: Burman NievesWilliam  Stevens M.D.   On: 06/15/2018 22:16    Melene PlanKim, Lavell Supple E, MD 06/17/2018, 6:13 AM PGY-1, [facility] FPTS Intern pager: [phone], text pages welcome

## 2018-06-17 NOTE — Progress Notes (Signed)
Occupational Therapy Treatment Patient Details Name: [name] MRN: [id number] DOB: 1965-06-30 Today's Date: 06/17/2018    History of present illness [name] is a 53 y.o. male presenting with fever, chills, dry cough and malaise concerning for UTI . PMH is significant for CVA w/ residual rightsided weakness, hypothyroidism, tachycardia, lymphedema, HLD. Found to have sepsis likely due to UTI   OT comments  Recommend resting hand splint for night time wear daily. Splint in a designated drawer clearly labeled for storage. Pt with PROM to hand and hygiene performed. Pt tolerated splint for 2 hours without concerns. Please remove splint and note in progress notes if patient expresses any concerns with night wear schedule.    Follow Up Recommendations  SNF;Supervision/Assistance - 24 hour    Equipment Recommendations  None recommended by OT    Recommendations for Other Services      Precautions / Restrictions Precautions Precautions: Fall Precaution Comments: RUE contractures, spasms (pt reports they do not hurt) Restrictions Weight Bearing Restrictions: No       Mobility Bed Mobility Overal bed mobility: Needs Assistance             General bed mobility comments: total +2 total (A) to slid to [facility] prior to eating.   Transfers                      Balance                                           ADL either performed or assessed with clinical judgement   ADL Overall ADL's : Needs assistance/impaired Eating/Feeding: Set up Eating/Feeding Details (indicate cue type and reason): pt able to self feed with fork L hand this AM with repositioning in the bed and HOB elevated. OT opening all containers and setting up tray to maximize indep.                                    General ADL Comments: pt provided PROM of all digits, wrist prior to splint application. pt noted to have tone at wrist and limited flexion.       Vision       Perception     Praxis      Cognition Arousal/Alertness: Awake/alert Behavior During Therapy: WFL for tasks assessed/performed Overall Cognitive Status: Within Functional Limits for tasks assessed                                          Exercises Other Exercises Other Exercises: tolerated x2 hours no redness. pt reports comfortable.  Other Exercises: pt demonstrates ability to doff splint this session with L UE   Shoulder Instructions       General Comments pt tolerated resting hand splint x2 hours without redness. pt provided very detailed skin hygiene to the right UE with lotion applied to help with dry skin. pt tolerated PROM of digits in intervals and passive stretch at wrist. Recommend don of splints for night time wear at this time.     Pertinent Vitals/ Pain       Pain Assessment: No/denies pain  Home Living  Prior Functioning/Environment              Frequency  Min 2X/week        Progress Toward Goals  OT Goals(current goals can now be found in the care plan section)  Progress towards OT goals: Progressing toward goals  Acute Rehab OT Goals Patient Stated Goal: none stated- thanks therapist for cleaning R UE OT Goal Formulation: With patient Time For Goal Achievement: 06/29/18 Potential to Achieve Goals: Good ADL Goals Additional ADL Goal #1: Pt will be able to roll right with min A to A with basic ADLs Additional ADL Goal #2: Pt will be able to sit EOB for up to 5 minutes with no more than min A Additional ADL Goal #3: Pt will tolerate wearing of RUE splint and be able to director others in how to donn and doff it for him  Plan Discharge plan remains appropriate    Co-evaluation                 AM-PAC OT "6 Clicks" Daily Activity     Outcome Measure   Help from another person eating meals?: A Little Help from another person taking care of  personal grooming?: A Little Help from another person toileting, which includes using toliet, bedpan, or urinal?: Total Help from another person bathing (including washing, rinsing, drying)?: A Lot Help from another person to put on and taking off regular upper body clothing?: A Lot Help from another person to put on and taking off regular lower body clothing?: Total 6 Click Score: 12    End of Session    OT Visit Diagnosis: Other abnormalities of gait and mobility (R26.89);Muscle weakness (generalized) (M62.81);Hemiplegia and hemiparesis Hemiplegia - Right/Left: Right Hemiplegia - dominant/non-dominant: Dominant Hemiplegia - caused by: Cerebral infarction   Activity Tolerance Patient tolerated treatment well   Patient Left in bed   Nurse Communication Mobility status;Precautions        Time: 8206-0156 OT Time Calculation (min): 31 min  Charges: OT General Charges $OT Visit: 1 Visit OT Treatments $Self Care/Home Management : 8-22 mins $Therapeutic Activity: 8-22 mins $Orthotics/Prosthetics Check: 8-22 mins   Mateo Flow, OTR/L  Acute Rehabilitation Services Pager: [phone] Office: [phone] .    Mateo Flow 06/17/2018, 11:43 AM

## 2018-06-18 DIAGNOSIS — Z593 Problems related to living in residential institution: Secondary | ICD-10-CM

## 2018-06-18 DIAGNOSIS — T83511A Infection and inflammatory reaction due to indwelling urethral catheter, initial encounter: Secondary | ICD-10-CM

## 2018-06-18 DIAGNOSIS — E785 Hyperlipidemia, unspecified: Secondary | ICD-10-CM

## 2018-06-18 LAB — CBC
HCT: 31 % — ABNORMAL LOW (ref 39.0–52.0)
Hemoglobin: 10.2 g/dL — ABNORMAL LOW (ref 13.0–17.0)
MCH: 27.6 pg (ref 26.0–34.0)
MCHC: 32.9 g/dL (ref 30.0–36.0)
MCV: 83.8 fL (ref 80.0–100.0)
Platelets: 286 10*3/uL (ref 150–400)
RBC: 3.7 MIL/uL — ABNORMAL LOW (ref 4.22–5.81)
RDW: 14.9 % (ref 11.5–15.5)
WBC: 7.3 10*3/uL (ref 4.0–10.5)
nRBC: 0 % (ref 0.0–0.2)

## 2018-06-18 LAB — BASIC METABOLIC PANEL
Anion gap: 11 (ref 5–15)
BUN: 5 mg/dL — ABNORMAL LOW (ref 6–20)
CO2: 23 mmol/L (ref 22–32)
CREATININE: 0.73 mg/dL (ref 0.61–1.24)
Calcium: 8.2 mg/dL — ABNORMAL LOW (ref 8.9–10.3)
Chloride: 105 mmol/L (ref 98–111)
GFR calc Af Amer: >60 mL/min (ref >60)
GFR calc non Af Amer: >60 mL/min (ref >60)
Glucose, Bld: 122 mg/dL — ABNORMAL HIGH (ref 70–99)
Potassium: 3.4 mmol/L — ABNORMAL LOW (ref 3.5–5.1)
Sodium: 139 mmol/L (ref 135–145)

## 2018-06-18 MED ORDER — CIPROFLOXACIN HCL 500 MG PO TABS
500.0000 mg | ORAL_TABLET | Freq: Two times a day (BID) | ORAL | Status: DC
  Administered 2018-06-18 – 2018-06-19 (×3): 500 mg via ORAL
  Filled 2018-06-18 (×3): qty 1

## 2018-06-18 MED ORDER — LIDOCAINE HCL URETHRAL/MUCOSAL 2 % EX GEL
1.0000 "application " | Freq: Once | CUTANEOUS | Status: DC
  Filled 2018-06-18: qty 5

## 2018-06-18 MED ORDER — FENTANYL CITRATE (PF) 100 MCG/2ML IJ SOLN
12.5000 ug | Freq: Once | INTRAMUSCULAR | Status: AC
  Administered 2018-06-18: 12.5 ug via INTRAVENOUS
  Filled 2018-06-18: qty 2

## 2018-06-18 MED ORDER — POTASSIUM CHLORIDE CRYS ER 20 MEQ PO TBCR: 40.0000 meq | EXTENDED_RELEASE_TABLET | Freq: Every day | ORAL | Status: DC

## 2018-06-18 MED ORDER — POTASSIUM CHLORIDE CRYS ER 20 MEQ PO TBCR
40.0000 meq | EXTENDED_RELEASE_TABLET | Freq: Once | ORAL | Status: AC
  Administered 2018-06-18: 40 meq via ORAL
  Filled 2018-06-18: qty 2

## 2018-06-18 MED ORDER — LEVOTHYROXINE SODIUM 75 MCG PO TABS: 37.5000 ug | ORAL_TABLET | Freq: Every day | ORAL | Status: AC

## 2018-06-18 MED ORDER — CIPROFLOXACIN HCL 500 MG PO TABS: 500.0000 mg | ORAL_TABLET | Freq: Two times a day (BID) | ORAL | 0 refills | Status: AC

## 2018-06-18 NOTE — Progress Notes (Signed)
Pharmacy Antibiotic Note  [name] is a 53 y.o. male admitted on 06/14/2018 with UTI.  Pharmacy has been consulted for ciprofloxacin dosing.  Found to have pseudomonas UTI. Will treat more aggressively then normal UTI.  Plan: Stop Zosyn Start ciprofloxacin 500mg  PO BID x 4 more days   Weight: 262 lb 5.6 oz (119 kg)  Temp (24hrs), Avg:98.8 F (37.1 C), Min:98.4 F (36.9 C), Max:99.1 F (37.3 C)  Recent Labs  Lab 06/14/18 1653 06/15/18 0229 06/16/18 0316 06/17/18 0258 06/18/18 0315  WBC 13.5* 11.4* 11.8* 8.9 7.3  CREATININE 0.84 0.92 0.85 0.88 0.73  LATICACIDVEN 1.3  --   --   --   --     CrCl cannot be calculated (Unknown ideal weight.).    No Known Allergies  Thank you for allowing pharmacy to be a part of this patient's care.  Armandina Stammer 06/18/2018 11:29 AM

## 2018-06-18 NOTE — Plan of Care (Signed)
  Problem: Nutrition: Goal: Adequate nutrition will be maintained Outcome: Progressing   Problem: Coping: Goal: Level of anxiety will decrease Outcome: Progressing   Problem: Pain Managment: Goal: General experience of comfort will improve Outcome: Progressing   Problem: Safety: Goal: Ability to remain free from injury will improve Outcome: Progressing   Problem: Skin Integrity: Goal: Risk for impaired skin integrity will decrease Outcome: Progressing   Problem: Clinical Measurements: Goal: Signs and symptoms of infection will decrease Outcome: Progressing

## 2018-06-18 NOTE — Discharge Instructions (Signed)
Urethral Stricture  Urethral stricture is narrowing of the tube (urethra) that carries urine from the bladder out of the body. The urethra can become narrow due to scar tissue from an injury or infection. This can make it difficult to pass urine. In women, the urethra opens above the vaginal opening. In men, the urethra opens at the tip of the penis, and the urethra is much longer than it is in women. Because of the length of the male urethra, urethral stricture is much more common in men. This condition is treated with surgery. What are the causes? Common causes of urethral stricture in men and women include:  Urinary tract infection (UTI).  Sexually transmitted infection (STI).  Use of a tube placed into the urethra to drain urine from the bladder (urinary catheter).  Urinary tract surgery. In men, common causes of urethral stricture include:  A severe injury to the pelvis.  Prostate surgery.  Injury to the penis. In many cases, the cause of urethral stricture may not be known. What increases the risk? Urethral stricture is more likely to develop in:  Men, especially men who have had prostate surgery.  People who use urinary catheters.  People who have had urinary tract surgery. What are the signs or symptoms? The most common symptom of this condition is difficulty passing urine. This may cause decreased urine flow, dribbling, or spraying of urine. Other symptoms may include:  Frequent UTIs.  Blood in the urine.  Pain when urinating.  Swelling of the penis in men.  Inability to pass urine (urinary obstruction). How is this diagnosed? This condition may be diagnosed based on:  Your medical history.  A physical exam.  Urine tests to check for infection or bleeding.  X-rays.  Ultrasound.  Retrograde urethrogram. This is a type of test in which dye is injected into the urethra and then an X-ray is taken.  Urethroscopy. This is when a thin tube with a light and  camera on the end (urethroscope) is used to look at the urethra. How is this treated? This condition is treated with surgery. The type of surgery that you have depends on the severity of your condition. You may have:  Urethral dilation. In this procedure, the narrow part of the urethra is stretched open (dilated) with dilating instruments or a small balloon.  Urethrotomy. In this procedure, a urethroscope is placed into the urethra, and the narrow part of the urethra is cut open with a surgical blade inserted through the urethroscope.  Open surgery. In this procedure, an incision is made in the urethra, the narrow part is removed, and the urethra is reconstructed. Follow these instructions at home:   Take over-the-counter and prescription medicines only as told by your health care provider.  If you were prescribed an antibiotic medicine, take it as told by your health care provider. Do not stop taking the antibiotic even if you start to feel better.  Drink enough fluid to keep your urine clear or pale yellow.  Keep all follow-up visits as told by your health care provider. This is important. Contact a health care provider if:  You have signs of a urinary tract infection, such as: ? Frequent urination or passing small amounts of urine frequently. ? Needing to urinate urgently. ? Pain or burning with urination. ? Urine that smells bad or unusual. ? Cloudy urine. ? Pain in the lower abdomen or back. ? Trouble urinating. ? Blood in the urine. ? Vomiting or being less hungry than   normal. ? Diarrhea or abdominal pain. ? Vaginal discharge, if you are male.  Your symptoms are getting worse instead of better. Get help right away if:  You cannot pass urine.  You have a fever.  You have swelling, bruising, or discoloration of your genital area. This includes the penis, scrotum, and inner thighs for men, and the outer genital organs (vulva) and inner thighs for women.  You develop  swelling in your legs.  You have difficulty breathing. This information is not intended to replace advice given to you by your health care provider. Make sure you discuss any questions you have with your health care provider. Document Released: 05/26/2015 Document Revised: 10/05/2015 Document Reviewed: 04/16/2015 Elsevier Interactive Patient Education  2019 [employer]  

## 2018-06-18 NOTE — Social Work (Signed)
Updated SNF with recommended iv abx regimine.  According to SNF pt Medicaid will cover his abx.  Following for discharge. Pt care team paged with this update.  Octavio Graves, MSW, [facility] Clinical Social Work [phone]

## 2018-06-18 NOTE — Progress Notes (Addendum)
Family Medicine Teaching Service Daily Progress Note Intern Pager: [phone]  Patient name: [name] Medical record number: [id number] Date of birth: 10/08/1965 Age: 53 y.o. Gender: male  Primary Care Provider: Garnette Gunner, MD Consultants: csw Code Status: Full Code   Pt Overview and Major Events to Date:  Hospital Day: 5  Admitted: 06/14/2018 for CC: Weakness  Assessment and Plan: [name] is a 53 y.o. male admitted for sepsis and found to have UTI and middle lobe infiltrate concerning for pneumonia.  His chronic conditions include hypothyroidism, chronic indwelling Foley, hyperlipidemia, chronic bilateral lymphedema, hypokalemia  #Discharge Planning   D/c with PO abx   Change foley   Social work   #CAUTI >100,000 gram Pseudomonas w/ sensitivities. Switching patient to PO antibiotic regimen.   s/pZosyn as above (2/3-2/5) 7   Start ciproflox 2/6 - 2/10, dosing per pharmacy   Patient amenable for cath change at noon today with fentanyl for pain control.   Then can DC back to SNF.   #CAP vs atalectasis  Dr. [name] believes CXR and history more consistent with atelectasis and less worrying for pneumonia.   D/c zosyn  Coverage with ciproflox as above  #Chronic lymphedema  Discontinued torsemide on admission for hypokalemia. Patient should be evaluated for lower extremity wrapping.   D/c torsemide.   Ortho to place unna boots B/L before d/c  #Hypokalemia 3.4 today  KDUR 40mg  once  Follow K outpatient   Acute anemia On admission, hemoglobin is 12.8.  >11.9 > 11.1>10.0>10.2 today.  Likely iatrogenic.   Continue to follow CBC outpatient   #Hx Stroke   PT, OT recommend SNF  Continue ASA 81, 10 mg atorvastatin  #HLD   Continue 10 mg statin  # Hypothyroidism  Continue levothyroxine 37.5 mcg  Follow-up TSH in 4 to 6 weeks as outpatient  #Constipation  Continue home on MiraLAX daily, senna twice daily  Fluids: piggy  back w/ zosyn Electrolytes: KDUR Nutrition:  Diet Order            Diet regular Room service appropriate? Yes; Fluid consistency: Thin  Diet effective now             Disposition: [facility]   Medications: Scheduled Meds: . aspirin EC  81 mg Oral Daily  . atorvastatin  10 mg Oral QHS  . cholecalciferol  5,000 Units Oral Daily  . enoxaparin (LOVENOX) injection  40 mg Subcutaneous Daily  . levothyroxine  37.5 mcg Oral Q0600  . polyethylene glycol  17 g Oral Daily  . senna  1 tablet Oral BID  . tamsulosin  0.4 mg Oral q1800   Continuous Infusions: . piperacillin-tazobactam (ZOSYN)  IV 3.375 g (06/18/18 0518)   PRN Meds: acetaminophen **OR** acetaminophen, ondansetron **OR** ondansetron (ZOFRAN) IV  Subjective  Patient reports doing well overnight. No pain this morning.   Objective   Vital Signs Intake/Output  Temp:  [98.4 F (36.9 C)-99.1 F (37.3 C)] 98.4 F (36.9 C) (02/06 0459) Pulse Rate:  [71-79] 71 (02/06 0459) Resp:  [19] 19 (02/06 0459) BP: (118-126)/(75-76) 118/75 (02/06 0459) SpO2:  [98 %-99 %] 98 % (02/06 0459) Weight:  [373 kg] 119 kg (02/06 0500)  Filed Weights   06/17/18 0503 06/18/18 0500  Weight: 119 kg 119 kg   Intake/Output      02/05 0701 - 02/06 0700   P.O. 1240   IV Piggyback 61.3   Total Intake(mL/kg) 1301.3 (10.9)   Urine (mL/kg/hr) 2575 (0.9)   Total Output 2575  Net -1273.7          Patient Vitals for the past 24 hrs:  BP Temp Temp src Pulse Resp SpO2 Weight  06/18/18 0500 - - - - - - 119 kg  06/18/18 0459 118/75 98.4 F (36.9 C) Oral 71 19 98 % -  06/17/18 2118 126/76 99.1 F (37.3 C) Oral 79 19 99 % -    Physical Exam  Gen: NAD, alert, non-toxic, well-appearing, sitting comfortably eating breakfast  Skin: Warm and dry HEENT: NCAT.  MMM.  CV: RRR.  Normal S1-S2. No BLEE. Resp: continued productive cough. Lung sounds difficult to appreciate anteriorly and posteriorly due to body habitus and position Abd: NTND on palpation  to all 4 quadrants.  Pos bowel sounds Extremities: moves extremities spontaneously. Warm and well perfused.   Laboratory: Recent Labs  Lab 06/14/18 1653 06/15/18 0229 06/16/18 0316 06/17/18 0258 06/18/18 0315  WBC 13.5* 11.4* 11.8* 8.9 7.3  HGB 12.8* 11.9* 11.1* 10.0* 10.2*  HCT 38.4* 35.6* 33.8* 30.7* 31.0*  PLT 362 254 278 270 286   Recent Labs  Lab 06/14/18 1653 06/15/18 0229 06/16/18 0316 06/17/18 0258 06/18/18 0315  NA 136 141 136 137 139  K 2.7* 3.0* 3.0* 3.3* 3.4*  CL 95* 106 102 105 105  CO2 29 24 21* 25 23  BUN 5* 7 6 6  5*  CREATININE 0.84 0.92 0.85 0.88 0.73  CALCIUM 8.7* 7.8* 8.0* 8.2* 8.2*  PROT 7.9  --   --   --   --   BILITOT 0.9  --   --   --   --   ALKPHOS 90  --   --   --   --   ALT 13  --   --   --   --   AST 14*  --   --   --   --   GLUCOSE 129* 106* 94 96 122*    Imaging/Diagnostic Tests: No results found.     [name], Gurnie Duris E, MD 06/18/2018, 6:14 AM PGY-1, [facility] FPTS Intern pager: [phone], text pages welcome

## 2018-06-18 NOTE — Progress Notes (Signed)
Orthopedic Tech Progress Note Patient Details:  [name] 12-13-1965 [id number]  Ortho Devices Type of Ortho Device: Roland Rack boot Ortho Device/Splint Interventions: Ordered, Application, Adjustment   Post Interventions Patient Tolerated: Well Instructions Provided: Adjustment of device, Care of device   [name] 06/18/2018, 1:50 PM
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
## 2018-06-18 NOTE — Progress Notes (Signed)
Physical Therapy Treatment Patient Details Name: [name] MRN: [id number] DOB: 07/06/1965 Today's Date: 06/18/2018    History of Present Illness Joshua ClampRonald E Mccormick is a 53 y.o. male presenting with fever, chills, dry cough and malaise concerning for UTI . PMH is significant for CVA w/ residual rightsided weakness, hypothyroidism, tachycardia, lymphedema, HLD. Found to have sepsis likely due to UTI    PT Comments    Pt eager to work with therapy today. Pt is limited in his mobility by generalized weakness R>L side. Pt sitting in recliner which was placed perpendicular to raised bed with rail up. On initial attempt, pt required assist to reach bed rail, but was able to reach on his own with subsequent attempts. Pt was then able to engage his core muscles to statically sit in recliner. Pt relayed that he had been working with PT at his facility but that he wasn't able to progress quickly enough and was discharged. PT believes that he would benefit from SNF rehab to insure independence in his wheelchair at the SNF. PT will continue to follow acutely.     Follow Up Recommendations  SNF     Equipment Recommendations  None recommended by PT    Recommendations for Other Services OT consult(OT looking into splint)     Precautions / Restrictions Precautions Precautions: Fall Precaution Comments: RUE contractures, spasms (pt reports they do not hurt) Restrictions Weight Bearing Restrictions: No    Mobility  Bed Mobility               General bed mobility comments: OOB in recliner   Transfers                 General transfer comment: nursing staff used MaxiMove to move to chair                   Balance     Sitting balance-Leahy Scale: Poor Sitting balance - Comments: pt was able to work on core strength, pulling up to sitting using armrest and then pulling against his L knee                                    Cognition  Arousal/Alertness: Awake/alert Behavior During Therapy: WFL for tasks assessed/performed Overall Cognitive Status: Within Functional Limits for tasks assessed                                        Exercises General Exercises - Lower Extremity Ankle Circles/Pumps: AROM;Both;15 reps;Seated Long Arc Quad: AROM;Both;10 reps;Seated Hip Flexion/Marching: AROM;Both;10 reps;Seated Other Exercises Other Exercises: bringing himself to sitting without support pulling with L UE and then lowering back to the chair using eccentric core control. x5        Pertinent Vitals/Pain Pain Assessment: No/denies pain           PT Goals (current goals can now be found in the care plan section) Acute Rehab PT Goals PT Goal Formulation: With patient Time For Goal Achievement: 06/29/18 Potential to Achieve Goals: Fair Progress towards PT goals: Progressing toward goals    Frequency    Min 3X/week      PT Plan Current plan remains appropriate       AM-PAC PT "6 Clicks" Mobility   Outcome Measure  Help needed turning from your back to your  side while in a flat bed without using bedrails?: Total Help needed moving from lying on your back to sitting on the side of a flat bed without using bedrails?: Total Help needed moving to and from a bed to a chair (including a wheelchair)?: Total Help needed standing up from a chair using your arms (e.g., wheelchair or bedside chair)?: Total Help needed to walk in hospital room?: Total Help needed climbing 3-5 steps with a railing? : Total 6 Click Score: 6    End of Session Equipment Utilized During Treatment: Other (comment)(Mechanical lift) Activity Tolerance: Patient tolerated treatment well Patient left: in chair;with call bell/phone within reach;Other (comment)(working with ST) Nurse Communication: Mobility status;Need for lift equipment PT Visit Diagnosis: Other abnormalities of gait and mobility (R26.89);Hemiplegia and  hemiparesis;Muscle weakness (generalized) (M62.81) Hemiplegia - Right/Left: Right Hemiplegia - dominant/non-dominant: Dominant Hemiplegia - caused by: Cerebral infarction     Time: 7681-1572 PT Time Calculation (min) (ACUTE ONLY): 28 min  Charges:  $Therapeutic Exercise: 8-22 mins $Therapeutic Activity: 8-22 mins                     Yanelle Sousa B. Beverely Risen PT, DPT Acute Rehabilitation Services Pager [phone] Office [phone]    [name] 06/18/2018, 2:04 PM

## 2018-06-18 NOTE — Progress Notes (Signed)
Unsuccessful attempt to replace foley catheter. Paged MD to request order for coude

## 2018-06-18 NOTE — Social Work (Addendum)
Acknowledging difficulty with catheter placement, await urology consult.  Updated SNF that discharge will be tomorrow.   Octavio Graves, MSW, [facility] Clinical Social Work [phone]

## 2018-06-18 NOTE — Progress Notes (Signed)
Attempted to place coude catheter twice unsuccessful. MD will get urology consult

## 2018-06-19 DIAGNOSIS — B965 Pseudomonas (aeruginosa) (mallei) (pseudomallei) as the cause of diseases classified elsewhere: Secondary | ICD-10-CM

## 2018-06-19 DIAGNOSIS — G4733 Obstructive sleep apnea (adult) (pediatric): Secondary | ICD-10-CM

## 2018-06-19 DIAGNOSIS — K219 Gastro-esophageal reflux disease without esophagitis: Secondary | ICD-10-CM

## 2018-06-19 LAB — URINE CULTURE: Culture: >=100000 — AB

## 2018-06-19 LAB — CULTURE, BLOOD (ROUTINE X 2)
CULTURE: NO GROWTH
CULTURE: NO GROWTH
SPECIAL REQUESTS: ADEQUATE

## 2018-06-19 NOTE — Social Work (Signed)
Clinical Social Worker facilitated patient discharge including contacting patient family and facility to confirm patient discharge plans.  Clinical information faxed to facility and family agreeable with plan.  [name] arranged ambulance transport via [facility] to [facility] .  RN to call [phone]  with report prior to discharge.  Clinical Social Worker will sign off for now as social work intervention is no longer needed. Please consult us again if new need arises.  Ameila Weldon, MSW, LCSWA Clinical Social Worker [phone]   

## 2018-06-19 NOTE — Social Work (Signed)
Acknowledging new foley insertion.  [name] continuing to follow for support with disposition when medically appropriate.  Octavio Graves, MSW, [facility] Clinical Social Work [phone]

## 2018-06-19 NOTE — Progress Notes (Signed)
Pt discharged escorted by [facility] via stretcher, pt alert and oriented with foley cath , no s/s of distress noted, personal belongings given.

## 2018-06-19 NOTE — Progress Notes (Signed)
I'm trying to call the POA his father for update but unsuccessful.

## 2018-06-19 NOTE — Progress Notes (Signed)
Pt for discharge today going to SNF [facility], report given to [employer], Charity fundraiser, discontinued saline lock peripheral IV line, he will be discharge with foley cath , did health teachings, next appointment, due med explained and understood, no complain of pain noted at this time, waiting for [facility] to pick him up.

## 2018-06-19 NOTE — Plan of Care (Signed)
  Problem: Clinical Measurements: Goal: Will remain free from infection Outcome: Progressing   Problem: Nutrition: Goal: Adequate nutrition will be maintained Outcome: Progressing   Problem: Coping: Goal: Level of anxiety will decrease Outcome: Progressing   Problem: Pain Managment: Goal: General experience of comfort will improve Outcome: Progressing   Problem: Safety: Goal: Ability to remain free from injury will improve Outcome: Progressing   Problem: Skin Integrity: Goal: Risk for impaired skin integrity will decrease Outcome: Progressing   

## 2018-06-19 NOTE — Clinical Social Work Placement (Signed)
   CLINICAL SOCIAL WORK PLACEMENT  NOTE [facility]  Date:  06/19/2018  Patient Details  Name: [name] MRN: [id number] Date of Birth: 12/18/1965  Clinical Social Work is seeking post-discharge placement for this patient at the Skilled  Nursing Facility level of care (*[name] will initial, date and re-position this form in  chart as items are completed):  Yes   Patient/family provided with [facility] Clinical Social Work Department's list of facilities offering this level of care within the geographic area requested by the patient (or if unable, by the patient's family).  Yes   Patient/family informed of their freedom to choose among providers that offer the needed level of care, that participate in Medicare, Medicaid or managed care program needed by the patient, have an available bed and are willing to accept the patient.  Yes   Patient/family informed of [facility] ownership interest in [facility] and [facility], as well as of the fact that they are under no obligation to receive care at these facilities.  PASRR submitted to EDS on       PASRR number received on       Existing PASRR number confirmed on 06/16/18     FL2 transmitted to all facilities in geographic area requested by pt/family on 06/16/18     FL2 transmitted to all facilities within larger geographic area on       Patient informed that his/her managed care company has contracts with or will negotiate with certain facilities, including the following:        Yes   Patient/family informed of bed offers received.  Patient chooses bed at Other - please specify in the comment section below:([facility])     Physician recommends and patient chooses bed at      Patient to be transferred to Other - please specify in the comment section below[facility] on 06/19/18.  Patient to be transferred to facility by [facility]     Patient family notified on 06/19/18 of  transfer.  Name of family member notified:  pt a&ox     PHYSICIAN Please prepare prescriptions, Please prepare priority discharge summary, including medications     Additional Comment:    _______________________________________________ Joshua HutchingIsabel H [name], LCSWA 06/19/2018, 11:30 AM

## 2018-06-22 ENCOUNTER — Inpatient Hospital Stay: Payer: Medicaid Other | Admitting: Family Medicine

## 2019-02-12 ENCOUNTER — Encounter (HOSPITAL_COMMUNITY): Payer: Self-pay | Admitting: Emergency Medicine

## 2019-02-12 ENCOUNTER — Emergency Department (HOSPITAL_COMMUNITY): Payer: Medicaid Other

## 2019-02-12 ENCOUNTER — Other Ambulatory Visit: Payer: Self-pay

## 2019-02-12 ENCOUNTER — Inpatient Hospital Stay (HOSPITAL_COMMUNITY)
Admission: EM | Admit: 2019-02-12 | Discharge: 2019-02-14 | DRG: 699 | Disposition: A | Discharge status: Skilled Nursing Facility | Payer: Medicaid Other | Attending: Family Medicine | Admitting: Family Medicine

## 2019-02-12 DIAGNOSIS — N200 Calculus of kidney: Secondary | ICD-10-CM | POA: Diagnosis present

## 2019-02-12 DIAGNOSIS — N210 Calculus in bladder: Secondary | ICD-10-CM

## 2019-02-12 DIAGNOSIS — N529 Male erectile dysfunction, unspecified: Secondary | ICD-10-CM | POA: Diagnosis present

## 2019-02-12 DIAGNOSIS — T83098A Other mechanical complication of other indwelling urethral catheter, initial encounter: Principal | ICD-10-CM | POA: Diagnosis present

## 2019-02-12 DIAGNOSIS — Z79899 Other long term (current) drug therapy: Secondary | ICD-10-CM

## 2019-02-12 DIAGNOSIS — T839XXA Unspecified complication of genitourinary prosthetic device, implant and graft, initial encounter: Secondary | ICD-10-CM

## 2019-02-12 DIAGNOSIS — N393 Stress incontinence (female) (male): Secondary | ICD-10-CM | POA: Diagnosis present

## 2019-02-12 DIAGNOSIS — K219 Gastro-esophageal reflux disease without esophagitis: Secondary | ICD-10-CM | POA: Diagnosis present

## 2019-02-12 DIAGNOSIS — N319 Neuromuscular dysfunction of bladder, unspecified: Secondary | ICD-10-CM | POA: Diagnosis present

## 2019-02-12 DIAGNOSIS — Z66 Do not resuscitate: Secondary | ICD-10-CM | POA: Diagnosis present

## 2019-02-12 DIAGNOSIS — M24541 Contracture, right hand: Secondary | ICD-10-CM | POA: Diagnosis present

## 2019-02-12 DIAGNOSIS — R7303 Prediabetes: Secondary | ICD-10-CM | POA: Diagnosis present

## 2019-02-12 DIAGNOSIS — Y846 Urinary catheterization as the cause of abnormal reaction of the patient, or of later complication, without mention of misadventure at the time of the procedure: Secondary | ICD-10-CM | POA: Diagnosis present

## 2019-02-12 DIAGNOSIS — Z9114 Patient's other noncompliance with medication regimen: Secondary | ICD-10-CM

## 2019-02-12 DIAGNOSIS — Z7982 Long term (current) use of aspirin: Secondary | ICD-10-CM

## 2019-02-12 DIAGNOSIS — R319 Hematuria, unspecified: Secondary | ICD-10-CM

## 2019-02-12 DIAGNOSIS — I69351 Hemiplegia and hemiparesis following cerebral infarction affecting right dominant side: Secondary | ICD-10-CM

## 2019-02-12 DIAGNOSIS — Z9181 History of falling: Secondary | ICD-10-CM

## 2019-02-12 DIAGNOSIS — G4733 Obstructive sleep apnea (adult) (pediatric): Secondary | ICD-10-CM | POA: Diagnosis present

## 2019-02-12 DIAGNOSIS — Z79890 Hormone replacement therapy (postmenopausal): Secondary | ICD-10-CM

## 2019-02-12 DIAGNOSIS — Z20828 Contact with and (suspected) exposure to other viral communicable diseases: Secondary | ICD-10-CM | POA: Diagnosis present

## 2019-02-12 DIAGNOSIS — R339 Retention of urine, unspecified: Secondary | ICD-10-CM

## 2019-02-12 DIAGNOSIS — N320 Bladder-neck obstruction: Secondary | ICD-10-CM

## 2019-02-12 DIAGNOSIS — Z993 Dependence on wheelchair: Secondary | ICD-10-CM

## 2019-02-12 DIAGNOSIS — Q549 Hypospadias, unspecified: Secondary | ICD-10-CM

## 2019-02-12 DIAGNOSIS — I890 Lymphedema, not elsewhere classified: Secondary | ICD-10-CM | POA: Diagnosis present

## 2019-02-12 DIAGNOSIS — E785 Hyperlipidemia, unspecified: Secondary | ICD-10-CM | POA: Diagnosis present

## 2019-02-12 DIAGNOSIS — E039 Hypothyroidism, unspecified: Secondary | ICD-10-CM | POA: Diagnosis present

## 2019-02-12 DIAGNOSIS — J449 Chronic obstructive pulmonary disease, unspecified: Secondary | ICD-10-CM | POA: Diagnosis present

## 2019-02-12 DIAGNOSIS — R32 Unspecified urinary incontinence: Secondary | ICD-10-CM

## 2019-02-12 DIAGNOSIS — I10 Essential (primary) hypertension: Secondary | ICD-10-CM | POA: Diagnosis present

## 2019-02-12 DIAGNOSIS — Z8249 Family history of ischemic heart disease and other diseases of the circulatory system: Secondary | ICD-10-CM

## 2019-02-12 DIAGNOSIS — Z833 Family history of diabetes mellitus: Secondary | ICD-10-CM

## 2019-02-12 DIAGNOSIS — F17210 Nicotine dependence, cigarettes, uncomplicated: Secondary | ICD-10-CM | POA: Diagnosis present

## 2019-02-12 NOTE — ED Notes (Signed)
Admitting at bedside 

## 2019-02-12 NOTE — ED Notes (Signed)
Urology at bedside.
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
## 2019-02-12 NOTE — ED Triage Notes (Signed)
Pt BIB Nursing Home after staff pulled out 16 fr urinary cath. Per EMS report, staff reported blood noted after pulling of cath.   Pt presents to ED with no bleeding at this time. No urinary out put.
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
## 2019-02-12 NOTE — ED Notes (Signed)
Pt informed NPO until urology consult per Dr. Armandina Gemma

## 2019-02-12 NOTE — ED Notes (Signed)
Pt currently eat, NPO at midnight, will return for COVID and IV
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
## 2019-02-12 NOTE — ED Provider Notes (Signed)
I saw and evaluated the patient, reviewed the resident's note and I agree with the findings and plan.  EKG:   53 year old male here to the catheter dysfunction.  Patient's penis is splayed open and the nurses at his facility have trouble with urine outflow as well as noted some blood.  On exam here cannot advance the catheter.  Will consult urology   Lacretia Leigh, MD 02/12/19 2053

## 2019-02-12 NOTE — ED Notes (Signed)
Bladder Scan @ 84mL

## 2019-02-12 NOTE — ED Notes (Signed)
Patient transported to CT 

## 2019-02-13 DIAGNOSIS — R32 Unspecified urinary incontinence: Secondary | ICD-10-CM

## 2019-02-13 DIAGNOSIS — N320 Bladder-neck obstruction: Secondary | ICD-10-CM | POA: Diagnosis not present

## 2019-02-13 DIAGNOSIS — T839XXA Unspecified complication of genitourinary prosthetic device, implant and graft, initial encounter: Secondary | ICD-10-CM | POA: Diagnosis not present

## 2019-02-13 DIAGNOSIS — N210 Calculus in bladder: Secondary | ICD-10-CM | POA: Diagnosis not present

## 2019-02-13 LAB — BASIC METABOLIC PANEL
Anion gap: 11 (ref 5–15)
Anion gap: 10 (ref 5–15)
BUN: 6 mg/dL (ref 6–20)
BUN: 7 mg/dL (ref 6–20)
CO2: 29 mmol/L (ref 22–32)
CO2: 27 mmol/L (ref 22–32)
Calcium: 8.9 mg/dL (ref 8.9–10.3)
Calcium: 8.4 mg/dL — ABNORMAL LOW (ref 8.9–10.3)
Chloride: 102 mmol/L (ref 98–111)
Chloride: 103 mmol/L (ref 98–111)
Creatinine, Ser: 0.84 mg/dL (ref 0.61–1.24)
Creatinine, Ser: 0.8 mg/dL (ref 0.61–1.24)
GFR calc Af Amer: >60 mL/min (ref >60)
GFR calc Af Amer: >60 mL/min (ref >60)
GFR calc non Af Amer: >60 mL/min (ref >60)
GFR calc non Af Amer: >60 mL/min (ref >60)
Glucose, Bld: 99 mg/dL (ref 70–99)
Glucose, Bld: 96 mg/dL (ref 70–99)
Potassium: 3.9 mmol/L (ref 3.5–5.1)
Potassium: 3.7 mmol/L (ref 3.5–5.1)
Sodium: 142 mmol/L (ref 135–145)
Sodium: 140 mmol/L (ref 135–145)

## 2019-02-13 LAB — CBC
HCT: 36 % — ABNORMAL LOW (ref 39.0–52.0)
HCT: 42 % (ref 39.0–52.0)
Hemoglobin: 12 g/dL — ABNORMAL LOW (ref 13.0–17.0)
Hemoglobin: 13.4 g/dL (ref 13.0–17.0)
MCH: 28.3 pg (ref 26.0–34.0)
MCH: 29.1 pg (ref 26.0–34.0)
MCHC: 31.9 g/dL (ref 30.0–36.0)
MCHC: 33.3 g/dL (ref 30.0–36.0)
MCV: 87.2 fL (ref 80.0–100.0)
MCV: 88.6 fL (ref 80.0–100.0)
Platelets: 366 10*3/uL (ref 150–400)
Platelets: 393 10*3/uL (ref 150–400)
RBC: 4.13 MIL/uL — ABNORMAL LOW (ref 4.22–5.81)
RBC: 4.74 MIL/uL (ref 4.22–5.81)
RDW: 15.6 % — ABNORMAL HIGH (ref 11.5–15.5)
RDW: 15.6 % — ABNORMAL HIGH (ref 11.5–15.5)
WBC: 7.1 10*3/uL (ref 4.0–10.5)
WBC: 8.9 10*3/uL (ref 4.0–10.5)
nRBC: 0 % (ref 0.0–0.2)
nRBC: 0 % (ref 0.0–0.2)

## 2019-02-13 LAB — SARS CORONAVIRUS 2 BY RT PCR (HOSPITAL ORDER, PERFORMED IN ~~LOC~~ HOSPITAL LAB): SARS Coronavirus 2: NEGATIVE

## 2019-02-13 LAB — APTT: aPTT: 39 seconds — ABNORMAL HIGH (ref 24–36)

## 2019-02-13 LAB — PROTIME-INR
INR: 1.2 (ref 0.8–1.2)
Prothrombin Time: 14.6 seconds (ref 11.4–15.2)

## 2019-02-13 MED ORDER — CHLORHEXIDINE GLUCONATE CLOTH 2 % EX PADS: 6.0000 | MEDICATED_PAD | Freq: Every day | CUTANEOUS | Status: DC

## 2019-02-13 MED ORDER — ATORVASTATIN CALCIUM 10 MG PO TABS
10.0000 mg | ORAL_TABLET | Freq: Every day | ORAL | Status: DC
  Administered 2019-02-13: 10 mg via ORAL
  Filled 2019-02-13: qty 1

## 2019-02-13 MED ORDER — TORSEMIDE 20 MG PO TABS
20.0000 mg | ORAL_TABLET | Freq: Every day | ORAL | Status: DC
  Administered 2019-02-13 – 2019-02-14 (×2): 20 mg via ORAL
  Filled 2019-02-13 (×2): qty 1

## 2019-02-13 MED ORDER — TAMSULOSIN HCL 0.4 MG PO CAPS
0.4000 mg | ORAL_CAPSULE | Freq: Every day | ORAL | Status: DC
  Administered 2019-02-13: 18:00:00 0.4 mg via ORAL
  Filled 2019-02-13: qty 1

## 2019-02-13 MED ORDER — POLYETHYLENE GLYCOL 3350 17 G PO PACK: 17.0000 g | PACK | Freq: Every day | ORAL | Status: DC | PRN

## 2019-02-13 MED ORDER — LEVOTHYROXINE SODIUM 75 MCG PO TABS
37.5000 ug | ORAL_TABLET | Freq: Every day | ORAL | Status: DC
  Filled 2019-02-13: qty 1

## 2019-02-13 MED ORDER — SODIUM CHLORIDE 0.9 % IV SOLN
INTRAVENOUS | Status: DC
  Administered 2019-02-13 – 2019-02-14 (×4): via INTRAVENOUS

## 2019-02-13 MED ORDER — POTASSIUM CHLORIDE 20 MEQ PO PACK
40.0000 meq | PACK | Freq: Every day | ORAL | Status: DC
  Administered 2019-02-13 – 2019-02-14 (×2): 40 meq via ORAL
  Filled 2019-02-13 (×2): qty 2

## 2019-02-13 NOTE — Evaluation (Signed)
Occupational Therapy Evaluation Patient Details Name: [name] MRN: [id number] DOB: 09-30-1965 Today's Date: 02/13/2019    History of Present Illness [name] is a 53 y.o. male presenting with foley catheter that could not be replaced at assisted living facility . PMH is significant for TIA, Stress Incontinence, Falls, HTN, Lymphedema, HTN, Hyperlipidemia, Rt Side Hemiplegia.CT scan yesterday showed encrustation/stone on the end of the catheter therefore causing catheter to be unable to be removed. Pt to have suprapubic catheter placement tenatively scheduled for 10/4   Clinical Impression   This 53 yo male admitted with above presents to acute OT with the need and want for a splint for his right hand due to hand wants to stay closed. Have assessed and written order for one, hopeful to get it tomorrow.    Follow Up Recommendations  No OT follow up;Supervision/Assistance - 24 hour    Equipment Recommendations  None recommended by OT       Precautions / Restrictions Restrictions Weight Bearing Restrictions: No      Mobility Bed Mobility               General bed mobility comments: Lift OOB at SNF         ADL either performed or assessed with clinical judgement   ADL                                         General ADL Comments: Can self feed and do most of grooming, but is dependent for all other ADLs     Vision Patient Visual Report: No change from baseline              Pertinent Vitals/Pain Pain Assessment: No/denies pain     Hand Dominance Right(but now left due to prior CVA)   Extremity/Trunk Assessment Upper Extremity Assessment Upper Extremity Assessment: RUE deficits/detail RUE Deficits / Details: Pt with old CVA with some contractures, reports he use to have a splint for his hand but does not anymore and would like a new one. Pt has enough PROM that a resting hand splint from [facility] would work for him RUE  Coordination: decreased fine motor;decreased gross Soil scientist Communication: No difficulties   Cognition Arousal/Alertness: Awake/alert Behavior During Therapy: WFL for tasks assessed/performed Overall Cognitive Status: Within Functional Limits for tasks assessed                                                Home Living Family/patient expects to be discharged to:: Skilled nursing facility                                        Prior Functioning/Environment          Comments: Pt reports that they lift him out of bed, that they bath-dress-and toilet him in bed. He is able to self feed and do most of his grooming        OT Problem List: Decreased range of motion;Decreased strength      OT Treatment/Interventions: Splinting    OT Goals(Current goals can be found in the care  plan section) Acute Rehab OT Goals Patient Stated Goal: to get a new splint for my right hand OT Goal Formulation: With patient Time For Goal Achievement: 02/27/19 Potential to Achieve Goals: Good  OT Frequency: Min 2X/week              AM-PAC OT "6 Clicks" Daily Activity     Outcome Measure Help from another person eating meals?: None Help from another person taking care of personal grooming?: A Little Help from another person toileting, which includes using toliet, bedpan, or urinal?: Total Help from another person bathing (including washing, rinsing, drying)?: Total Help from another person to put on and taking off regular upper body clothing?: Total Help from another person to put on and taking off regular lower body clothing?: Total 6 Click Score: 11   End of Session Nurse Communication: (RUE splint: got order from MD, called ortho tech, hope to have right resting hand splint tomorrow)  Activity Tolerance: Patient tolerated treatment well Patient left: in bed;with call bell/phone within reach  OT Visit Diagnosis:  Hemiplegia and hemiparesis Hemiplegia - Right/Left: Right Hemiplegia - dominant/non-dominant: Dominant Hemiplegia - caused by: Cerebral infarction                Time: 3545-6256 OT Time Calculation (min): 10 min Charges:  OT General Charges $OT Visit: 1 Visit OT Evaluation $OT Eval Low Complexity: 1 Low  Golden Circle, OTR/L Acute [employer] Pager [phone] Office [phone]    Almon Register 02/13/2019, 5:39 PM

## 2019-02-13 NOTE — Progress Notes (Signed)
Orthopedic Tech Progress Note Patient Details:  [name] Nov 13, 1965 [id number]  Patient ID: Mayer Camel, male   DOB: September 07, 1965, 53 y.o.   MRN: [id number]   [place] Pink 02/13/2019, 5:12 PMCalled [facility] for right resting hand splint.

## 2019-02-13 NOTE — ED Notes (Signed)
Urine leakage noted from catheter. No bleeding. No pain at this time

## 2019-02-13 NOTE — ED Notes (Signed)
Discussed NPO status with Pt. Pt verbalized understanding.

## 2019-02-13 NOTE — Progress Notes (Signed)
Splint actually came today. Applied by [employer] Rep and pt reporting if feels fine. Spoke with pt and RN Joaquim Lai) that pt's arm/hand needs to be checked at 8:00 for any skin issues, pressure issues, pain--if no issues then he can wear it another 2 hours--check again and if no issues then can wear it the rest of the night. If issues remove and we will check on tomorrow. During day pt should have it on 4 hours off 4 hours (once it is determined no issues with wearing it).  Golden Circle, OTR/L Acute Rehab Services Pager [phone] Office [phone]

## 2019-02-13 NOTE — ED Notes (Signed)
DNR bracelet placed on pt. Pt confirmed DNR wishes.   Right arm restricted and fall risk bracelet also placed at this time

## 2019-02-13 NOTE — Progress Notes (Signed)
Urology Inpatient Progress Report  plastic stuck in penis        Intv/Subj: No acute events overnight. Patient is without complaint. CT scan yesterday showed encrustation/stone on the end of the catheter therefore causing catheter to be unable to be removed.  Active Problems:   Complication of Foley catheter (HCC)  Current Facility-Administered Medications  Medication Dose Route Frequency Provider Last Rate Last Dose  . 0.9 %  sodium chloride infusion   Intravenous Continuous Oralia Manis, DO 100 mL/hr at 02/13/19 1205    . atorvastatin (LIPITOR) tablet 10 mg  10 mg Oral QHS Oralia Manis, DO      . levothyroxine (SYNTHROID) tablet 37.5 mcg  37.5 mcg Oral Q0600 Oralia Manis, DO      . polyethylene glycol (MIRALAX / GLYCOLAX) packet 17 g  17 g Oral Daily PRN Oralia Manis, DO      . potassium chloride (KLOR-CON) packet 40 mEq  40 mEq Oral Daily Darin Engels, Sherin, DO   40 mEq at 02/13/19 1206  . tamsulosin (FLOMAX) capsule 0.4 mg  0.4 mg Oral q1800 Oralia Manis, DO      . torsemide [facility]) tablet 20 mg  20 mg Oral Daily Darin Engels, Sherin, DO   20 mg at 02/13/19 1206   Current Outpatient Medications  Medication Sig Dispense Refill  . aspirin EC 81 MG tablet Take 81 mg daily by mouth.    Marland Kitchen atorvastatin (LIPITOR) 10 MG tablet Take 10 mg at bedtime by mouth.    . Cholecalciferol (VITAMIN D) 125 MCG (5000 UT) CAPS Take 5,000 Units by mouth daily.     Marland Kitchen levothyroxine (SYNTHROID, LEVOTHROID) 75 MCG tablet Take 0.5 tablets (37.5 mcg total) by mouth daily at 6 (six) AM.    . potassium chloride (KLOR-CON) 20 MEQ packet Take 40 mEq by mouth daily.    Marland Kitchen senna (SENOKOT) 8.6 MG TABS tablet Take 1 tablet by mouth 2 (two) times daily.    . tamsulosin (FLOMAX) 0.4 MG CAPS capsule Take 0.4 mg by mouth daily at 6 PM.     . torsemide (DEMADEX) 20 MG tablet Take 20 mg by mouth daily.       Objective: Vital: Vitals:   02/12/19 2337 02/13/19 0000 02/13/19 0118 02/13/19 0539  BP: 135/87  (!) 128/92 122/89 (!) 146/87  Pulse: 79  86 77  Resp: 20  20 15   Temp:      SpO2: 98%  97% 97%   I/Os: No intake/output data recorded.  Physical Exam:  General: Patient is in no apparent distress Lungs: Normal respiratory effort, chest expands symmetrically. GI: The abdomen is soft and nontender without mass. Foley: In place and draining currently Ext: lower extremities symmetric  Lab Results: Recent Labs    02/13/19 0018 02/13/19 0520  WBC 8.9 7.1  HGB 13.4 12.0*  HCT 42.0 36.0*   Recent Labs    02/13/19 0018 02/13/19 0520  NA 142 140  K 3.9 3.7  CL 102 103  CO2 29 27  GLUCOSE 99 96  BUN 6 7  CREATININE 0.84 0.80  CALCIUM 8.9 8.4*   Recent Labs    02/13/19 0520  INR 1.2   No results for input(s): LABURIN in the last 72 hours. Results for orders placed or performed during the hospital encounter of 02/12/19  SARS Coronavirus 2 [facility] order, Performed in [facility] hospital lab) Nasopharyngeal Nasopharyngeal Swab     Status: None   Collection Time: 02/13/19 12:18 AM   Specimen: Nasopharyngeal Swab  Result Value Ref Range Status   SARS Coronavirus 2 NEGATIVE NEGATIVE Final    Comment: (NOTE) If result is NEGATIVE SARS-CoV-2 target nucleic acids are NOT DETECTED. The SARS-CoV-2 RNA is generally detectable in upper and lower  respiratory specimens during the acute phase of infection. The lowest  concentration of SARS-CoV-2 viral copies this assay can detect is 250  copies / mL. A negative result does not preclude SARS-CoV-2 infection  and should not be used as the sole basis for treatment or other  patient management decisions.  A negative result may occur with  improper specimen collection / handling, submission of specimen other  than nasopharyngeal swab, presence of viral mutation(s) within the  areas targeted by this assay, and inadequate number of viral copies  (<250 copies / mL). A negative result must be combined with clinical  observations,  patient history, and epidemiological information. If result is POSITIVE SARS-CoV-2 target nucleic acids are DETECTED. The SARS-CoV-2 RNA is generally detectable in upper and lower  respiratory specimens dur ing the acute phase of infection.  Positive  results are indicative of active infection with SARS-CoV-2.  Clinical  correlation with patient history and other diagnostic information is  necessary to determine patient infection status.  Positive results do  not rule out bacterial infection or co-infection with other viruses. If result is PRESUMPTIVE POSTIVE SARS-CoV-2 nucleic acids MAY BE PRESENT.   A presumptive positive result was obtained on the submitted specimen  and confirmed on repeat testing.  While 2019 novel coronavirus  (SARS-CoV-2) nucleic acids may be present in the submitted sample  additional confirmatory testing may be necessary for epidemiological  and / or clinical management purposes  to differentiate between  SARS-CoV-2 and other Sarbecovirus currently known to infect humans.  If clinically indicated additional testing with an alternate test  methodology [phone]) is advised. The SARS-CoV-2 RNA is generally  detectable in upper and lower respiratory sp ecimens during the acute  phase of infection. The expected result is Negative. Fact Sheet for Patients:  StrictlyIdeas.no Fact Sheet for Healthcare Providers: BankingDealers.co.za This test is not yet approved or cleared by the [place] FDA and has been authorized for detection and/or diagnosis of SARS-CoV-2 by FDA under an Emergency Use Authorization (EUA).  This EUA will remain in effect (meaning this test can be used) for the duration of the COVID-19 declaration under Section 564(b)(1) of the Act, 21 U.S.C. section 360bbb-3(b)(1), unless the authorization is terminated or revoked sooner. Performed at [facility], [place] [address], [place],  [place] [address]     Studies/Results: Ct Renal Stone Study  Result Date: 02/12/2019 CLINICAL DATA:  Flank pain, blood in the urine EXAM: CT ABDOMEN AND PELVIS WITHOUT CONTRAST TECHNIQUE: Multidetector CT imaging of the abdomen and pelvis was performed following the standard protocol without IV contrast. COMPARISON:  None. FINDINGS: Lower chest: The visualized heart size within normal limits. No pericardial fluid/thickening. No hiatal hernia. The visualized portions of the lungs are clear. Hepatobiliary: Although limited due to the lack of intravenous contrast, normal in appearance without gross focal abnormality. No evidence of calcified gallstones or biliary ductal dilatation. Pancreas:  Unremarkable.  No surrounding inflammatory changes. Spleen: Normal in size. Although limited due to the lack of intravenous contrast, normal in appearance. Adrenals/Urinary Tract: Both adrenal glands appear normal. There are bilateral renal calculi seen. The largest in the lower pole of the right kidney measures 4 mm. The largest in the midpole of the left kidney measures 4 mm.  No hydronephrosis is seen. No ureteral or bladder calculi are noted. There is right-sided perinephric stranding, predominantly around the lower pole. No perinephric fluid collections however. The bladder appears to be partially decompressed with apparent wall thickening and a small amount of air seen in the superior bladder. Stomach/Bowel: The stomach, small bowel, and colon are normal in appearance. No inflammatory changes or obstructive findings. appendix is normal. Vascular/Lymphatic: There are no enlarged abdominal or pelvic lymph nodes. 1 Scattered aortic atherosclerotic calcifications are seen without aneurysmal dilatation. Reproductive: The prostate is unremarkable. Calcifications seen within the prostate. Other: No evidence of abdominal wall mass or hernia. Musculoskeletal: No acute or significant osseous findings. Nonunited posterior right lower rib  fracture. IMPRESSION: 1. Sub 5 mm bilateral non-obstructing renal calculi. No collecting system calculi. 2. Right perinephric stranding which could be due to pyelonephritis. 3. Partially decompressed bladder with apparent diffuse nonspecific bladder wall thickening. Electronically Signed   By: Jonna ClarkBindu  Avutu M.D.   On: 02/12/2019 22:53    Assessment: Encrusted Foley catheter  Plan: I spoke with interventional radiology.  They are in agreement with placement of a suprapubic tube today.  Once this is done, the patient can likely go home.  I will plan to have him follow-up in 4 to 6 weeks for cystoscopy through his SP tube tract with possible lithotripsy of the surrounding stone.   Modena SlaterEugene Athalie Newhard, MD Urology 02/13/2019, 12:46 PM

## 2019-02-13 NOTE — Progress Notes (Signed)
Family Medicine Teaching Service Daily Progress Note Intern Pager: [phone]  Patient name: [name] Medical record number: [id number] Date of birth: June 26, 1965 Age: 53 y.o. Gender: male  Primary Care Provider: Bonnita Hollow, MD Consultants: Urology Code Status: DNR  Pt Overview and Major Events to Date:   Admit 10/03  Assessment and Plan: [name] is a 53 year old gentleman who presented with inability to exchange chronic indwelling Foley catheter, leading to iatrogenic hypospadias.  Past medical history significant for stress incontinence, falls, hypertension, lymphedema, hyperlipidemia, right sided weakness in the setting of previous CVA.   Encrusted Foley catheter  Iatrogenic hypospadias: Stable.  Undergoing suprapubic Foley placement today, 10/4.  No current urinary symptoms. -IR on board, performing above -Urology on board, to follow-up outpatient in 4-6 weeks for cystoscopy and catheter removal - Follow-up urine culture, suspect will be contaminated  Incidental nonobstructing nephrolithiasis on CT: Stable. - Urology on board, considering lithotripsy outpatient in 4 to 6 weeks on follow-up  Incidental right perinephric stranding on CT: Stable. Patient afebrile and asymptomatic without leukocytosis, will continue to monitor.   Previous CVA with residual right hemiparesis: Chronic, stable. Baseline requires mechanical lift to wheelchair.  - Continue home atorvastatin 10 mg - Restart home aspirin post procedure - OT on board -Fall precautions  Lymphedema: Chronic, stable. -Continue home Torsemide 20 mg daily - Monitor daily weights - Discussed lymphedema wrappings  Hypertension: Chronic, stable. SBP wnl (120-130s) overnight.  Not currently on any controller therapy. - Monitor blood pressure  Hypothyroidism: Chronic, stable. -Continue home Synthroid  Hyperlipidemia: Chronic, stable  -Continue home atorvastatin 10 mg, will clarify if any previous  statin intolerance as he should be on a higher intensity given previous CVA  Prediabetes: Chronic. Last A1c 6.1 in 03/2017.  Elevated fasting glucose at 119 this morning. - Recheck A1c  FEN/GI:  -NPO for procedure -N/S 100cc/hr  PPx:  -SCDs, start dvt ppx afterwards  Disposition: Pending procedure this morning, will restart process back to SNF afterwards if doing well  Subjective:  No acute events overnight.  Doing well this morning, states his catheter has not been manipulated, so it is not been causing him any troubles currently.  Denies any urinary symptoms.  Denies any lightheadedness/dizziness, abdominal pains, back pain.  Looking forward to breakfast after procedure.  Objective: Temp:  [97.7 F (36.5 C)-97.9 F (36.6 C)] 97.7 F (36.5 C) (10/03 2043) Pulse Rate:  [67-86] 67 (10/03 2043) Resp:  [15-20] 17 (10/03 2043) BP: (122-146)/(76-95) 124/86 (10/03 2043) SpO2:  [97 %-100 %] 98 % (10/03 2043) Weight:  [112.2 kg] 112.2 kg (10/03 1400) Physical Exam: General: Alert, NAD, sitting comfortably in bed HEENT: NCAT, MMM, oropharynx nonerythematous  Cardiac: RRR no m/g/r Lungs: Clear bilaterally, no increased WOB  Abdomen: soft, non-tender  GU: Deferred Ext: Warm, dry, 2+ distal pulses, no edema    Laboratory: Recent Labs  Lab 02/13/19 0018 02/13/19 0520  WBC 8.9 7.1  HGB 13.4 12.0*  HCT 42.0 36.0*  PLT 393 366   Recent Labs  Lab 02/13/19 0018 02/13/19 0520  NA 142 140  K 3.9 3.7  CL 102 103  CO2 29 27  BUN 6 7  CREATININE 0.84 0.80  CALCIUM 8.9 8.4*  GLUCOSE 99 96      Imaging/Diagnostic Tests: No new imaging  Joshua Clan, DO 02/13/2019, 9:03 PM PGY-2, [place] Intern pager: [phone], text pages welcome

## 2019-02-13 NOTE — Progress Notes (Signed)
Received patient from ED, patient was alert and oriented, not in any distress, patient admitted with a  Foley cathter that was stucked in his penis, site was  leaking urine . Oriented patient to the unit. VSS , will continue to monitor.

## 2019-02-13 NOTE — Discharge Summary (Signed)
Family Medicine Teaching [facility] Discharge Summary  Patient name: [name] Medical record number: [id number] Date of birth: 09-Dec-1965 Age: 53 y.o. Gender: male Date of Admission: 02/12/2019  Date of Discharge: 02/14/2019 Admitting Physician: Nestor Ramp, MD  Primary Care Provider: Garnette Gunner, MD Consultants: Urology, IR  Indication for Hospitalization: Encrusted Foley catheter, with inability to remove  Discharge Diagnoses/Problem List:  Previous CVA with right-sided hemiplegia, wheelchair-bound Incidental nonobstructive nephrolithiasis Chronic indwelling Foley catheter Hypertension Chronic lymphedema Hyperlipidemia Previous CAUTI  Disposition: SNF  Discharge Condition: Stable  Discharge Exam:   General: Alert, NAD, sitting comfortably in bed HEENT: NCAT, MMM, oropharynx nonerythematous Cardiac: RRR with no M/G/R Lungs: Clear bilaterally no increased work of breathing Abdomen: Soft, nontender GU: Deferred Extremity: Warm, dry, 2+ distal pulses, no edema  Brief Hospital Course:  [name] is a 52 year old gentleman, with a history of a chronic indwelling Foley catheter (?due to neurogenic bladder and immobility with previous CVA), who was admitted for the inability to replace his Foley catheter secondary to encrusting with concurrent iatrogenic hypospadias.  Reportedly prior to admission, catheter had not been exchanged in approximately 3-4 months.  Reassuringly on arrival he was afebrile without any urinary complaints, with exception of pain upon Foley manipulation.  CT abdomen without hydronephrosis, but noted 5 mm bilateral nonobstructing renal calculi and right perinephric stranding.  Urology and IR consulted, recommended proceeding with suprapubic catheter placement, which was completed by IR on 10/4 without difficulty.  He will follow-up with urology in approximately 4-6 weeks for cystoscopy and Foley catheter removal.  On discharge, he was  afebrile and hemodynamically stable.   Issues for Follow Up:  1. Please make sure he follows up with urology in approximately 4-6 weeks for cystoscopy, Foley catheter removal, and possible lithotripsy for renal stones seen on CT. 2. Continue to monitor right hand contracture, started wearing hand splint on 10/3 per OT. 3. Consider increasing atorvastatin dose, should be on moderate-high intensity statin given previous CVA.  Significant Procedures:  Suprapubic catheter placement on 10/4  Significant Labs and Imaging:  Recent Labs  Lab 02/13/19 0018 02/13/19 0520 02/14/19 0440  WBC 8.9 7.1 7.4  HGB 13.4 12.0* 12.0*  HCT 42.0 36.0* 36.7*  PLT 393 366 324   Recent Labs  Lab 02/13/19 0018 02/13/19 0520 02/14/19 0440  NA 142 140 140  K 3.9 3.7 3.8  CL 102 103 103  CO2 29 27 25   GLUCOSE 99 96 119*  BUN 6 7 10   CREATININE 0.84 0.80 0.79  CALCIUM 8.9 8.4* 8.3*      Results/Tests Pending at Time of Discharge: None  Discharge Medications:  Allergies as of 02/14/2019   No Known Allergies     Medication List    TAKE these medications   aspirin EC 81 MG tablet Take 81 mg daily by mouth.   atorvastatin 10 MG tablet Commonly known as: LIPITOR Take 10 mg at bedtime by mouth.   levothyroxine 75 MCG tablet Commonly known as: SYNTHROID Take 0.5 tablets (37.5 mcg total) by mouth daily at 6 (six) AM.   potassium chloride 20 MEQ packet Commonly known as: KLOR-CON Take 40 mEq by mouth daily.   senna 8.6 MG Tabs tablet Commonly known as: SENOKOT Take 1 tablet by mouth 2 (two) times daily.   tamsulosin 0.4 MG Caps capsule Commonly known as: FLOMAX Take 0.4 mg by mouth daily at 6 PM.   torsemide 20 MG tablet Commonly known as: DEMADEX Take 20 mg by  mouth daily.   Vitamin D 125 MCG (5000 UT) Caps Take 5,000 Units by mouth daily.       Discharge Instructions: Please refer to Patient Instructions section of EMR for full details.  Patient was counseled important signs  and symptoms that should prompt return to medical care, changes in medications, dietary instructions, activity restrictions, and follow up appointments.   Follow-Up Appointments:   Lurline Del, DO 02/14/2019, 3:54 PM PGY-1, [place]

## 2019-02-13 NOTE — ED Notes (Signed)
Discussed need for UA with pt.

## 2019-02-13 NOTE — Progress Notes (Addendum)
Family Medicine Teaching Service Daily Progress Note Intern Pager: [phone]  Patient name: [name] Medical record number: [id number] Date of birth: 10/09/1965 Age: 53 y.o. Gender: male  Primary Care Provider: Bonnita Hollow, MD Consultants: Urology Code Status: DNR  Pt Overview and Major Events to Date:  Admit 10/03    Assessment and Plan:  Inability to change out chronic indwelling catheter-stable  -Pt reports having no discomfort.  Foley insitu without urine draining.  Unable to differentiate if having hematuria as pt remains in brief that was previously tinged with blood.  No fresh blood noted -Appreciate Urology recs -Per Urology- OR today for suprapubic catheter placement and cystoscopy -monitor output -Consider reassess need for Tamulosin -monitor for increasing hematuria -PT/PTT/INR pending -CBC pending  Nephrolithiasis- finding on CT-stable -Pt having no pain. -Appreciate Urology recs -Per Urology plan for lithotripsy today  Pyelonephritis- finding on CT-stable -Pt continues to be afebrile and asymptomatic.  -Continue to monitor fever curve -If symptomatic consider Cefepime IV -f/u U/A -CBC in am   Hx CVA with residual Rt hemiparesis-chronic -continues to have Rt side weakness but still able to move all limbs. -PT/OT eval appreciated -Restart home medication ASA post procedure pending Urology recommendations  Lymphedema-chronic -continues to have lower extremity edema Rt>LT, pulses present bilaterally and showing no signs of acute infection -Consider lymphedema wraps -PT/OT eval appreciated -Continue home medication Torsemide -Continue KCL  -Monitor BMP daily -daily weights  HTN-chronic -well controlled, not currently taking any BP meds -monitor BP daily  Hypothyroidism-chronic -Continue home med Synthroid  Hyperlipidemia-chronic - Continue home med Lipitor  Hx of falls-chronic -High fall risk, patient is wheelchair bound -Continue  fall precautions  FEN/GI:  -NPO -N/S 100cc/hr  PPx:  -SCDs  Disposition: Awaiting Suprapubic catheter placement and cystoscopy today. Return to SNF when medically stable.  Subjective:  Pt resting comfortably.  No complaints voiced.  Denies any chest pain, SOB, n/v, abdo pain or pain in genital area.   Objective: Temp:  [97.9 F (36.6 C)] 97.9 F (36.6 C) (10/02 1949) Pulse Rate:  [75-88] 86 (10/03 0118) Resp:  [18-20] 20 (10/03 0118) BP: (122-158)/(85-95) 122/89 (10/03 0118) SpO2:  [97 %-100 %] 97 % (10/03 0118) Physical Exam: General: On stretcher watching TV, in no acute distress Cardiovascular: RRR, no murmurs appreciated, distal pulses present bilaterally Respiratory: CTAB, no crackles, no rhonchi, good cap refill Abdomen: obese, soft, non tender, non distended, BS present GU: hypospadias with foley cath not connected to collection bag. No fresh hematuria noted.  Laboratory: Recent Labs  Lab 02/13/19 0018  WBC 8.9  HGB 13.4  HCT 42.0  PLT 393   Recent Labs  Lab 02/13/19 0018  NA 142  K 3.9  CL 102  CO2 29  BUN 6  CREATININE 0.84  CALCIUM 8.9  GLUCOSE 99      Imaging/Diagnostic Tests: No new imaging  Carollee Leitz, MD 02/13/2019, 2:17 AM PGY-1, [place] Intern pager: [phone], text pages welcome
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
## 2019-02-13 NOTE — Progress Notes (Signed)
   02/13/19 0900  PT Visit Information  Last PT Received On 02/13/19  Reason Eval/Treat Not Completed PT screened, no needs identified, will sign off   At baseline pt is a mechanical lift to a wheelchair, and has onsite PT services in his ALF to follow up with him.  Will sign off for now, and thank you for this referral.  Mee Hives, PT MS Acute Rehab Dept. Number: [place]

## 2019-02-14 ENCOUNTER — Inpatient Hospital Stay (HOSPITAL_COMMUNITY): Payer: Medicaid Other

## 2019-02-14 DIAGNOSIS — T83098A Other mechanical complication of other indwelling urethral catheter, initial encounter: Secondary | ICD-10-CM | POA: Diagnosis present

## 2019-02-14 DIAGNOSIS — I10 Essential (primary) hypertension: Secondary | ICD-10-CM | POA: Diagnosis present

## 2019-02-14 DIAGNOSIS — Z9114 Patient's other noncompliance with medication regimen: Secondary | ICD-10-CM | POA: Diagnosis not present

## 2019-02-14 DIAGNOSIS — N319 Neuromuscular dysfunction of bladder, unspecified: Secondary | ICD-10-CM | POA: Diagnosis present

## 2019-02-14 DIAGNOSIS — N200 Calculus of kidney: Secondary | ICD-10-CM | POA: Diagnosis present

## 2019-02-14 DIAGNOSIS — Y846 Urinary catheterization as the cause of abnormal reaction of the patient, or of later complication, without mention of misadventure at the time of the procedure: Secondary | ICD-10-CM | POA: Diagnosis present

## 2019-02-14 DIAGNOSIS — J449 Chronic obstructive pulmonary disease, unspecified: Secondary | ICD-10-CM | POA: Diagnosis present

## 2019-02-14 DIAGNOSIS — R7303 Prediabetes: Secondary | ICD-10-CM | POA: Diagnosis present

## 2019-02-14 DIAGNOSIS — Z8249 Family history of ischemic heart disease and other diseases of the circulatory system: Secondary | ICD-10-CM | POA: Diagnosis not present

## 2019-02-14 DIAGNOSIS — Z993 Dependence on wheelchair: Secondary | ICD-10-CM | POA: Diagnosis not present

## 2019-02-14 DIAGNOSIS — F17210 Nicotine dependence, cigarettes, uncomplicated: Secondary | ICD-10-CM | POA: Diagnosis present

## 2019-02-14 DIAGNOSIS — T839XXA Unspecified complication of genitourinary prosthetic device, implant and graft, initial encounter: Secondary | ICD-10-CM | POA: Diagnosis not present

## 2019-02-14 DIAGNOSIS — N393 Stress incontinence (female) (male): Secondary | ICD-10-CM | POA: Diagnosis present

## 2019-02-14 DIAGNOSIS — E039 Hypothyroidism, unspecified: Secondary | ICD-10-CM | POA: Diagnosis present

## 2019-02-14 DIAGNOSIS — N529 Male erectile dysfunction, unspecified: Secondary | ICD-10-CM | POA: Diagnosis present

## 2019-02-14 DIAGNOSIS — K219 Gastro-esophageal reflux disease without esophagitis: Secondary | ICD-10-CM | POA: Diagnosis present

## 2019-02-14 DIAGNOSIS — R32 Unspecified urinary incontinence: Secondary | ICD-10-CM | POA: Diagnosis not present

## 2019-02-14 DIAGNOSIS — R339 Retention of urine, unspecified: Secondary | ICD-10-CM | POA: Diagnosis present

## 2019-02-14 DIAGNOSIS — N320 Bladder-neck obstruction: Secondary | ICD-10-CM | POA: Diagnosis present

## 2019-02-14 DIAGNOSIS — Z9181 History of falling: Secondary | ICD-10-CM | POA: Diagnosis not present

## 2019-02-14 DIAGNOSIS — G4733 Obstructive sleep apnea (adult) (pediatric): Secondary | ICD-10-CM | POA: Diagnosis present

## 2019-02-14 DIAGNOSIS — M24541 Contracture, right hand: Secondary | ICD-10-CM | POA: Diagnosis present

## 2019-02-14 DIAGNOSIS — Z66 Do not resuscitate: Secondary | ICD-10-CM | POA: Diagnosis present

## 2019-02-14 DIAGNOSIS — Z20828 Contact with and (suspected) exposure to other viral communicable diseases: Secondary | ICD-10-CM | POA: Diagnosis present

## 2019-02-14 DIAGNOSIS — E785 Hyperlipidemia, unspecified: Secondary | ICD-10-CM | POA: Diagnosis present

## 2019-02-14 DIAGNOSIS — Q549 Hypospadias, unspecified: Secondary | ICD-10-CM | POA: Diagnosis not present

## 2019-02-14 DIAGNOSIS — I890 Lymphedema, not elsewhere classified: Secondary | ICD-10-CM | POA: Diagnosis present

## 2019-02-14 DIAGNOSIS — I69351 Hemiplegia and hemiparesis following cerebral infarction affecting right dominant side: Secondary | ICD-10-CM | POA: Diagnosis not present

## 2019-02-14 LAB — URINALYSIS, ROUTINE W REFLEX MICROSCOPIC
Bilirubin Urine: NEGATIVE
Glucose, UA: NEGATIVE mg/dL
Ketones, ur: NEGATIVE mg/dL
Nitrite: NEGATIVE
Protein, ur: 30 mg/dL — AB
Specific Gravity, Urine: 1.016 (ref 1.005–1.030)
WBC, UA: >50 WBC/hpf — ABNORMAL HIGH (ref 0–5)
pH: 5 (ref 5.0–8.0)

## 2019-02-14 LAB — BASIC METABOLIC PANEL
Anion gap: 12 (ref 5–15)
BUN: 10 mg/dL (ref 6–20)
CO2: 25 mmol/L (ref 22–32)
Calcium: 8.3 mg/dL — ABNORMAL LOW (ref 8.9–10.3)
Chloride: 103 mmol/L (ref 98–111)
Creatinine, Ser: 0.79 mg/dL (ref 0.61–1.24)
GFR calc Af Amer: >60 mL/min (ref >60)
GFR calc non Af Amer: >60 mL/min (ref >60)
Glucose, Bld: 119 mg/dL — ABNORMAL HIGH (ref 70–99)
Potassium: 3.8 mmol/L (ref 3.5–5.1)
Sodium: 140 mmol/L (ref 135–145)

## 2019-02-14 LAB — CBC WITH DIFFERENTIAL/PLATELET
Abs Immature Granulocytes: 0.01 10*3/uL (ref 0.00–0.07)
Basophils Absolute: 0.1 10*3/uL (ref 0.0–0.1)
Basophils Relative: 1 %
Eosinophils Absolute: 0.2 10*3/uL (ref 0.0–0.5)
Eosinophils Relative: 2 %
HCT: 36.7 % — ABNORMAL LOW (ref 39.0–52.0)
Hemoglobin: 12 g/dL — ABNORMAL LOW (ref 13.0–17.0)
Immature Granulocytes: 0 %
Lymphocytes Relative: 31 %
Lymphs Abs: 2.3 10*3/uL (ref 0.7–4.0)
MCH: 28.8 pg (ref 26.0–34.0)
MCHC: 32.7 g/dL (ref 30.0–36.0)
MCV: 88 fL (ref 80.0–100.0)
Monocytes Absolute: 0.6 10*3/uL (ref 0.1–1.0)
Monocytes Relative: 8 %
Neutro Abs: 4.3 10*3/uL (ref 1.7–7.7)
Neutrophils Relative %: 58 %
Platelets: 324 10*3/uL (ref 150–400)
RBC: 4.17 MIL/uL — ABNORMAL LOW (ref 4.22–5.81)
RDW: 15.4 % (ref 11.5–15.5)
WBC: 7.4 10*3/uL (ref 4.0–10.5)
nRBC: 0 % (ref 0.0–0.2)

## 2019-02-14 LAB — HEMOGLOBIN A1C
Hgb A1c MFr Bld: 5.6 % (ref 4.8–5.6)
Mean Plasma Glucose: 114.02 mg/dL

## 2019-02-14 MED ORDER — MIDAZOLAM HCL 2 MG/2ML IJ SOLN
INTRAMUSCULAR | Status: AC
  Filled 2019-02-14: qty 6

## 2019-02-14 MED ORDER — MIDAZOLAM HCL 2 MG/2ML IJ SOLN
INTRAMUSCULAR | Status: AC | PRN
  Administered 2019-02-14 (×3): 1 mg via INTRAVENOUS

## 2019-02-14 MED ORDER — FENTANYL CITRATE (PF) 100 MCG/2ML IJ SOLN
INTRAMUSCULAR | Status: AC | PRN
  Administered 2019-02-14 (×2): 50 ug via INTRAVENOUS

## 2019-02-14 MED ORDER — LIDOCAINE HCL 1 % IJ SOLN
INTRAMUSCULAR | Status: AC
  Filled 2019-02-14: qty 20

## 2019-02-14 MED ORDER — FENTANYL CITRATE (PF) 100 MCG/2ML IJ SOLN
INTRAMUSCULAR | Status: AC
  Filled 2019-02-14: qty 4

## 2019-02-14 NOTE — Progress Notes (Addendum)
Occupational Therapy Treatment Patient Details Name: [name] MRN: [id number] DOB: [birth year] Today's Date: 02/14/2019    History of present illness COBIE MARCOUX is a 53 y.o. male presenting with foley catheter that could not be replaced at assisted living facility . PMH is significant for TIA, Stress Incontinence, Falls, HTN, Lymphedema, HTN, Hyperlipidemia, Rt Side Hemiplegia.CT scan yesterday showed encrustation/stone on the end of the catheter therefore causing catheter to be unable to be removed. Pt to have suprapubic catheter placement tenatively scheduled for 10/4   OT comments  Pt seen for splint check.  Splint is fitting well, pt reports wearing splint for 4 hours last night and removing (with assistance from staff) after tingling in fingers which resolved after movement of hand.  Educated on donning/doffing splint, splint care, wear schedule (on/off 4 hours), signs and symptoms to look out for (pressure, redness, pain, etc).  He is able to doff splint independently, but requires mod assist to don (he is able to verbalize how to have staff assist though).  Will follow to ensure effective splint schedule.   OT NOTE  RN STAFF Splint wear schedule: ON/OFF every 4 hours   Please check splint every 4 hours during shift ( remove splint) to assess for: * pain * redness *swelling  If any symptoms above present remove splint for 15 minutes. If symptoms continue - keep the splint removed and notify OT staff [phone] immediately.   Keep the UE elevated at all times on pillows / towels.  Splint can be cleaned with warm soapy water and alcohol swab. Splint should not be placed in heat of any kind because the splint with mold into a new shape.     Follow Up Recommendations  No OT follow up;Supervision/Assistance - 24 hour    Equipment Recommendations  None recommended by OT    Recommendations for Other Services      Precautions / Restrictions Restrictions Weight  Bearing Restrictions: No       Mobility Bed Mobility                  Transfers                      Balance                                           ADL either performed or assessed with clinical judgement   ADL                                               Vision       Perception     Praxis      Cognition Arousal/Alertness: Awake/alert Behavior During Therapy: WFL for tasks assessed/performed Overall Cognitive Status: Within Functional Limits for tasks assessed                                          Exercises     Shoulder Instructions       General Comments patient seen for splint check and management for R hand. He reports wearing splint for 4 hours last night with no pain, removed this  morning with reports of tingling in fingers.  RN unaware of when splint was removed, but pt is a good historian.  Reviewed donning/doffing splint, pt able to doff with independence and requires mod assist to don but is able to verbalize how to have staff assist him.     Pertinent Vitals/ Pain       Pain Assessment: No/denies pain  Home Living                                          Prior Functioning/Environment              Frequency  Min 2X/week        Progress Toward Goals  OT Goals(current goals can now be found in the care plan section)  Progress towards OT goals: Progressing toward goals  Acute Rehab OT Goals Patient Stated Goal: to get a new splint for my right hand OT Goal Formulation: With patient Time For Goal Achievement: 02/27/19 Potential to Achieve Goals: Good ADL Goals Additional ADL Goal #1: Pt will tolerate RUE resting hand splint wearing Additional ADL Goal #2: Pt will be able to tell staff how to A him with donning splint  Plan Discharge plan remains appropriate;Frequency remains appropriate    Co-evaluation                 AM-PAC OT "6  Clicks" Daily Activity     Outcome Measure   Help from another person eating meals?: None Help from another person taking care of personal grooming?: A Little Help from another person toileting, which includes using toliet, bedpan, or urinal?: Total Help from another person bathing (including washing, rinsing, drying)?: Total Help from another person to put on and taking off regular upper body clothing?: Total Help from another person to put on and taking off regular lower body clothing?: Total 6 Click Score: 11    End of Session    OT Visit Diagnosis: Hemiplegia and hemiparesis Hemiplegia - Right/Left: Right Hemiplegia - dominant/non-dominant: Dominant Hemiplegia - caused by: Cerebral infarction   Activity Tolerance Patient tolerated treatment well   Patient Left in bed;with call bell/phone within reach   Nurse Communication Other (comment)(R UE splint, on/off 4 hrs )        Time: 2297-9892 OT Time Calculation (min): 13 min  Charges: OT General Charges $OT Visit: 1 Visit OT Treatments $Self Care/Home Management : 8-22 mins  Delight Stare, OT Acute Rehabilitation Services Pager [phone] Office [phone]    Delight Stare 02/14/2019, 11:52 AM

## 2019-02-14 NOTE — Procedures (Signed)
Interventional Radiology Procedure Note  Procedure: CT guided supra-pubic catheter placement. 59F was placed, due to small bladder and inability to distend the urinary bladder for larger drain placement. .  Complications: None  Recommendations:  - To gravity drain. - Can upsize the drain starting in 6 weeks as needed. - The foley catheter was damaged and previously was draining freely into a urinal between pt legs.  This catheter was cut and modified with a cap.  - Do not submerge - Routine care   Signed,  Dulcy Fanny. Earleen Newport, DO

## 2019-02-14 NOTE — Progress Notes (Signed)
Attempt made to give report to [facility] at [phone].

## 2019-02-14 NOTE — TOC Transition Note (Addendum)
Transition of Care [facility]) - CM/SW Discharge Note   Patient Details  Name: [name] MRN: [id number] Date of Birth: 02-Sep-1965  [facility] CM/SW Contact:  Gelene Mink, [place] Phone Number: 02/14/2019, 2:20 PM   Clinical Narrative:     Patient will DC to: [facility]  Anticipated DC date: 02/12/2019 Family notified: Yes Transport by: Corey Harold  Pick-up scheduled for 6:15pm, should RN need to call and change. Please contact [facility]: [phone], option 1, 3.   Per MD patient ready for DC to . RN, patient, patient's family, and facility notified of DC. Discharge Summary and FL2 sent to facility. RN to call report prior to discharge [phone]. Patient will report to room 112.  DC packet on chart. Ambulance transport requested for patient.   [name] will sign off for now as social work intervention is no longer needed. Please consult [place] again if new needs arise.  Itzamar Traynor, LCSW-A [place]/Clinical Social Work Department Cell: [phone]    Final next level of care: [place] Barriers to Discharge: No Barriers Identified   Patient Goals and CMS Choice Patient states their goals for this hospitalization and ongoing recovery are:: Pt to return back to [place] CMS Medicare.gov Compare Post Acute Care list provided to:: Patient Choice offered to / list presented to : NA  Discharge Placement   Existing PASRR number confirmed : 02/14/19          Patient chooses bed at: Other - please specify in the comment section below:([facility]) Patient to be transferred to facility by: [place] Name of family member notified: Saverio Danker (Father) Patient and family notified of of transfer: 02/14/19  Discharge Plan and Services In-house Referral: Clinical Social Work Discharge Planning Services: NA Post Acute Care Choice: [place]          DME Arranged: N/A DME Agency: NA       HH Arranged: NA HH Agency:  NA        Social Determinants of Health (SDOH) Interventions     Readmission Risk Interventions No flowsheet data found.

## 2019-02-14 NOTE — Discharge Instructions (Signed)
Discharge summary:  You were admitted due to to a Foley catheter that was unable to be removed.  During your hospitalization you received a suprapubic catheter. -You will be following up with urology in a few weeks to have your Foley catheter Foley removed. - Please follow up with your PCP within one week for recheck, including new medications such as your blood pressure medicine - Please return if you experience similar symptoms, high fevers, confusion, chest pain that does not subside within 15 minutes or shortness of breath.

## 2019-02-14 NOTE — Progress Notes (Signed)
Patient called this RN with c/o bag filling up quickly; Informed patient the bag attached is the appropriate bag for his cath placement. Patient verbalized understanding; Vanessa Capulin, MD and Earleen Newport, MD made aware of patient's concerns; No further instructions given. Will continue to monitor.    Second attempt made to give report to [facility] at [phone]. No answer at the facility.

## 2019-02-14 NOTE — TOC Initial Note (Addendum)
Transition of Care [facility]) - Initial/Assessment Note    Patient Details  Name: [name] MRN: [id number] Date of Birth: Nov 22, 1965  [facility] CM/SW Contact:    Gwenlyn Fudge, LCSWA Phone Number: 02/14/2019, 12:15 PM  Clinical Narrative:                  [name] called the patient's room and completed the assessment telephonically. The patient is in agreement to return back to [facility] once medically stable. He had no other questions or concerns.   [name] attempted to get Albin Felling from [facility] on the phone.[name] called and spoke with [facility]. They are able to take the patient back whenever he is ready for discharge. He will discharge to their quarantine unit.   Can discharge without needing a new COVID test, results are good through Monday. If discharging Tuesday, he will need a new COVID screen.   [name] will continue to follow and assist with discharge planning.   Expected Discharge Plan: Skilled Nursing Facility Barriers to Discharge: Continued Medical Work up   Patient Goals and CMS Choice Patient states their goals for this hospitalization and ongoing recovery are:: Pt is agreeable returning back to his SNF CMS Medicare.gov Compare Post Acute Care list provided to:: Patient Choice offered to / list presented to : Patient  Expected Discharge Plan and Services Expected Discharge Plan: Skilled Nursing Facility In-house Referral: Clinical Social Work Discharge Planning Services: NA Post Acute Care Choice: Skilled Nursing Facility Living arrangements for the past 2 months: Skilled Nursing Facility                 DME Arranged: N/A DME Agency: NA       HH Arranged: NA HH Agency: NA        Prior Living Arrangements/Services Living arrangements for the past 2 months: Skilled Nursing Facility Lives with:: Facility Resident Patient language and need for interpreter reviewed:: No Do you feel safe going back to the place where you live?: Yes      Need for  Family Participation in Patient Care: No (Comment) Care giver support system in place?: Yes (comment)   Criminal Activity/Legal Involvement Pertinent to Current Situation/Hospitalization: No - Comment as needed  Activities of Daily Living      Permission Sought/Granted Permission sought to share information with : Case Manager Permission granted to share information with : Yes, Verbal Permission Granted  Share Information with NAME: Hillary Schwegler  Permission granted to share info w AGENCY: [facility]  Permission granted to share info w Relationship: Father     Emotional Assessment Appearance:: Appears stated age Attitude/Demeanor/Rapport: Engaged Affect (typically observed): Calm Orientation: : Oriented to Self, Oriented to Place, Oriented to  Time, Oriented to Situation Alcohol / Substance Use: Not Applicable Psych Involvement: No (comment)  Admission diagnosis:  Urinary retention [R33.9] Bladder stones [N21.0] Bladder outlet obstruction [N32.0] Problem with Foley catheter, initial encounter (HCC) [P50.9XXA] Urinary incontinence, unspecified type [R32] Hematuria, unspecified type [R31.9] Patient Active Problem List   Diagnosis Date Noted  . Complication of Foley catheter (HCC) 02/12/2019  . Acute cystitis without hematuria   . Lives in long-term care facility 04/03/2017  . CVA, old, right hemiparesis (HCC) 04/03/2017  . Lymphedema of both lower extremities 04/03/2017  . Possible Catheter-associated urinary tract infection (HCC) 04/03/2017  . Contracture of elbow joint, right 04/03/2017  . Fever   . Sepsis (HCC)   . Morbid obesity (HCC) 03/09/2016  . Chronic acquired lymphedema 08/08/2012  . Obstructive sleep  apnea 06/08/2009  . Dyslipidemia 01/17/2009  . GERD 12/08/2008  . Essential hypertension 07/16/2007   PCP:  Bonnita Hollow, MD Pharmacy:   [facility] [address] [place]  [place] [address] Phone: [phone] Fax: [phone]     Social Determinants of Health (SDOH) Interventions    Readmission Risk Interventions No flowsheet data found.

## 2019-02-14 NOTE — [place] FL2 (Signed)
[place] MEDICAID FL2 LEVEL OF CARE SCREENING TOOL     IDENTIFICATION  Patient Name: [name] Birthdate: 1965-07-07 Sex: male Admission Date (Current Location): 02/12/2019  What [place] and [place] Number:  Haynes Bast [id number] Facility and Address:  [facility] [place], [address], [place], [place] [address]      Provider Number: [id number]  Attending Physician Name and Address:  Nestor Ramp, MD  Relative Name and Phone Number:  Calogero Geisen, Father, [phone]    Current Level of Care: Hospital Recommended Level of Care: Skilled Nursing Facility Prior Approval Number:    Date Approved/Denied: 12/11/14 PASRR Number: [id number]  Discharge Plan: SNF    Current Diagnoses: Patient Active Problem List   Diagnosis Date Noted  . Complication of Foley catheter (HCC) 02/12/2019  . Acute cystitis without hematuria   . Lives in long-term care facility 04/03/2017  . CVA, old, right hemiparesis (HCC) 04/03/2017  . Lymphedema of both lower extremities 04/03/2017  . Possible Catheter-associated urinary tract infection (HCC) 04/03/2017  . Contracture of elbow joint, right 04/03/2017  . Fever   . Sepsis (HCC)   . Morbid obesity (HCC) 03/09/2016  . Chronic acquired lymphedema 08/08/2012  . Obstructive sleep apnea 06/08/2009  . Dyslipidemia 01/17/2009  . GERD 12/08/2008  . Essential hypertension 07/16/2007    Orientation RESPIRATION BLADDER Height & Weight     Self, Time, Situation, Place  Normal Continent(suprapubic catheter) Weight: 247 lb 5.7 oz (112.2 kg) Height:  5\' 9"  (175.3 cm)  BEHAVIORAL SYMPTOMS/MOOD NEUROLOGICAL BOWEL NUTRITION STATUS      Continent Diet(Heart Healthy diet, thin liquids)  AMBULATORY STATUS COMMUNICATION OF NEEDS Skin   Limited Assist Verbally Skin abrasions, Other (Comment)(MASD on groin and scrotum (had cath placement today, dressing in place))                       Personal Care Assistance Level of  Assistance  Bathing, Feeding, Dressing, Total care Bathing Assistance: Limited assistance Feeding assistance: Independent Dressing Assistance: Limited assistance Total Care Assistance: Limited assistance   Functional Limitations Info  Sight, Hearing, Speech Sight Info: Adequate Hearing Info: Adequate Speech Info: Adequate    SPECIAL CARE FACTORS FREQUENCY                       Contractures Contractures Info: Not present    Additional Factors Info  Code Status, Allergies Code Status Info: DNR Allergies Info: No Known Allergies           Current Medications (02/14/2019):  This is the current hospital active medication list Current Facility-Administered Medications  Medication Dose Route Frequency Provider Last Rate Last Dose  . 0.9 %  sodium chloride infusion   Intravenous Continuous 04/16/2019, DO 100 mL/hr at 02/13/19 2114    . atorvastatin (LIPITOR) tablet 10 mg  10 mg Oral QHS 2115, Sherin, DO   10 mg at 02/13/19 2112  . Chlorhexidine Gluconate Cloth 2 % PADS 6 each  6 each Topical Daily 2113, MD   Stopped at 02/14/19 [phone]  . levothyroxine (SYNTHROID) tablet 37.5 mcg  37.5 mcg Oral Q0600 03-19-1990, DO      . polyethylene glycol (MIRALAX / GLYCOLAX) packet 17 g  17 g Oral Daily PRN Oralia Manis, DO      . potassium chloride (KLOR-CON) packet 40 mEq  40 mEq Oral Daily Oralia Manis, DO   Stopped at 02/14/19 [phone]  . tamsulosin (FLOMAX) capsule  0.4 mg  0.4 mg Oral q1800 Caroline More, DO   0.4 mg at 02/13/19 1759  . torsemide (DEMADEX) tablet 20 mg  20 mg Oral Daily Caroline More, DO   Stopped at 02/14/19 7673     Discharge Medications: Please see discharge summary for a list of discharge medications.  Relevant Imaging Results:  Relevant Lab Results:   Additional Information SSN: [id number]  Philippa Chester Quenisha Lovins, LCSWA

## 2019-02-15 LAB — URINE CULTURE

## 2019-03-13 ENCOUNTER — Emergency Department (HOSPITAL_COMMUNITY)
Admission: EM | Admit: 2019-03-13 | Discharge: 2019-03-13 | Disposition: A | Discharge status: Home / Self Care | Payer: Medicaid Other | Attending: Emergency Medicine | Admitting: Emergency Medicine

## 2019-03-13 ENCOUNTER — Other Ambulatory Visit: Payer: Self-pay

## 2019-03-13 DIAGNOSIS — Z87891 Personal history of nicotine dependence: Secondary | ICD-10-CM | POA: Insufficient documentation

## 2019-03-13 DIAGNOSIS — I10 Essential (primary) hypertension: Secondary | ICD-10-CM | POA: Diagnosis not present

## 2019-03-13 DIAGNOSIS — Z79899 Other long term (current) drug therapy: Secondary | ICD-10-CM | POA: Diagnosis not present

## 2019-03-13 DIAGNOSIS — J449 Chronic obstructive pulmonary disease, unspecified: Secondary | ICD-10-CM | POA: Diagnosis not present

## 2019-03-13 DIAGNOSIS — Z7982 Long term (current) use of aspirin: Secondary | ICD-10-CM | POA: Insufficient documentation

## 2019-03-13 DIAGNOSIS — Y732 Prosthetic and other implants, materials and accessory gastroenterology and urology devices associated with adverse incidents: Secondary | ICD-10-CM | POA: Diagnosis not present

## 2019-03-13 DIAGNOSIS — T839XXA Unspecified complication of genitourinary prosthetic device, implant and graft, initial encounter: Secondary | ICD-10-CM | POA: Insufficient documentation

## 2019-03-13 LAB — CBC WITH DIFFERENTIAL/PLATELET
Abs Immature Granulocytes: 0.02 10*3/uL (ref 0.00–0.07)
Basophils Absolute: 0.1 10*3/uL (ref 0.0–0.1)
Basophils Relative: 1 %
Eosinophils Absolute: 0.3 10*3/uL (ref 0.0–0.5)
Eosinophils Relative: 3 %
HCT: 40.7 % (ref 39.0–52.0)
Hemoglobin: 13.5 g/dL (ref 13.0–17.0)
Immature Granulocytes: 0 %
Lymphocytes Relative: 25 %
Lymphs Abs: 2.1 10*3/uL (ref 0.7–4.0)
MCH: 28.5 pg (ref 26.0–34.0)
MCHC: 33.2 g/dL (ref 30.0–36.0)
MCV: 85.9 fL (ref 80.0–100.0)
Monocytes Absolute: 0.7 10*3/uL (ref 0.1–1.0)
Monocytes Relative: 8 %
Neutro Abs: 5.3 10*3/uL (ref 1.7–7.7)
Neutrophils Relative %: 63 %
Platelets: 354 10*3/uL (ref 150–400)
RBC: 4.74 MIL/uL (ref 4.22–5.81)
RDW: 13.9 % (ref 11.5–15.5)
WBC: 8.4 10*3/uL (ref 4.0–10.5)
nRBC: 0 % (ref 0.0–0.2)

## 2019-03-13 LAB — BASIC METABOLIC PANEL
Anion gap: 12 (ref 5–15)
BUN: 8 mg/dL (ref 6–20)
CO2: 29 mmol/L (ref 22–32)
Calcium: 8.5 mg/dL — ABNORMAL LOW (ref 8.9–10.3)
Chloride: 97 mmol/L — ABNORMAL LOW (ref 98–111)
Creatinine, Ser: 0.8 mg/dL (ref 0.61–1.24)
GFR calc Af Amer: >60 mL/min (ref >60)
GFR calc non Af Amer: >60 mL/min (ref >60)
Glucose, Bld: 97 mg/dL (ref 70–99)
Potassium: 3.3 mmol/L — ABNORMAL LOW (ref 3.5–5.1)
Sodium: 138 mmol/L (ref 135–145)

## 2019-03-13 NOTE — ED Notes (Signed)
Pt has stage 2 breakdown around site of insertion. Strong odor of yeast with discharge. RN cleaned and dried.

## 2019-03-13 NOTE — ED Notes (Signed)
[facility] called @ 1403-per Benjamine Mola, RN called by Levada Dy

## 2019-03-13 NOTE — ED Notes (Signed)
Patient verbalizes understanding of discharge instructions. Opportunity for questioning and answers were provided. Armband removed by staff, pt discharged from ED.  

## 2019-03-13 NOTE — ED Triage Notes (Signed)
Per EMS Pt from [place]. Urinary cath leaking since yesterday. No complaints, no changes in urine appearance. R side deficits from previous stroke. BP 150/96 HR 94 RR 18 97% RA Temp 97.0

## 2019-03-13 NOTE — Discharge Instructions (Addendum)
Follow-up with your urologist this week.  I know you have an appointment scheduled and just checked when that appointment is you need to be seen at the latest next week

## 2019-03-13 NOTE — ED Notes (Addendum)
PT changed and cleaned and report called to [place] at [facility]. Notified her to follow up with urology and yeasty area near sight.
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
## 2020-05-16 ENCOUNTER — Emergency Department (HOSPITAL_COMMUNITY)
Admission: EM | Admit: 2020-05-16 | Discharge: 2020-05-17 | Disposition: A | Discharge status: Home / Self Care | Payer: Medicaid Other | Attending: Emergency Medicine | Admitting: Emergency Medicine

## 2020-05-16 DIAGNOSIS — N393 Stress incontinence (female) (male): Secondary | ICD-10-CM | POA: Insufficient documentation

## 2020-05-16 DIAGNOSIS — I10 Essential (primary) hypertension: Secondary | ICD-10-CM | POA: Insufficient documentation

## 2020-05-16 DIAGNOSIS — Z7982 Long term (current) use of aspirin: Secondary | ICD-10-CM | POA: Diagnosis not present

## 2020-05-16 DIAGNOSIS — T83018A Breakdown (mechanical) of other indwelling urethral catheter, initial encounter: Secondary | ICD-10-CM | POA: Insufficient documentation

## 2020-05-16 DIAGNOSIS — Z87891 Personal history of nicotine dependence: Secondary | ICD-10-CM | POA: Diagnosis not present

## 2020-05-16 DIAGNOSIS — Z79899 Other long term (current) drug therapy: Secondary | ICD-10-CM | POA: Insufficient documentation

## 2020-05-16 DIAGNOSIS — T83010A Breakdown (mechanical) of cystostomy catheter, initial encounter: Secondary | ICD-10-CM

## 2020-05-16 NOTE — ED Triage Notes (Signed)
Arrived via EMS; c/o issue with suprapubic catheter. Stated has not been changed in 4 months.

## 2020-05-16 NOTE — ED Notes (Signed)
Refused vitals 

## 2020-05-17 LAB — BASIC METABOLIC PANEL
Anion gap: 13 (ref 5–15)
BUN: 8 mg/dL (ref 6–20)
CO2: 30 mmol/L (ref 22–32)
Calcium: 8.6 mg/dL — ABNORMAL LOW (ref 8.9–10.3)
Chloride: 94 mmol/L — ABNORMAL LOW (ref 98–111)
Creatinine, Ser: 0.89 mg/dL (ref 0.61–1.24)
GFR, Estimated: >60 mL/min (ref >60)
Glucose, Bld: 105 mg/dL — ABNORMAL HIGH (ref 70–99)
Potassium: 3 mmol/L — ABNORMAL LOW (ref 3.5–5.1)
Sodium: 137 mmol/L (ref 135–145)

## 2020-05-17 LAB — CBC WITH DIFFERENTIAL/PLATELET
Abs Immature Granulocytes: 0.04 10*3/uL (ref 0.00–0.07)
Basophils Absolute: 0.1 10*3/uL (ref 0.0–0.1)
Basophils Relative: 1 %
Eosinophils Absolute: 0 10*3/uL (ref 0.0–0.5)
Eosinophils Relative: 0 %
HCT: 41 % (ref 39.0–52.0)
Hemoglobin: 14 g/dL (ref 13.0–17.0)
Immature Granulocytes: 0 %
Lymphocytes Relative: 9 %
Lymphs Abs: 1 10*3/uL (ref 0.7–4.0)
MCH: 29 pg (ref 26.0–34.0)
MCHC: 34.1 g/dL (ref 30.0–36.0)
MCV: 84.9 fL (ref 80.0–100.0)
Monocytes Absolute: 0.9 10*3/uL (ref 0.1–1.0)
Monocytes Relative: 9 %
Neutro Abs: 8.2 10*3/uL — ABNORMAL HIGH (ref 1.7–7.7)
Neutrophils Relative %: 81 %
Platelets: 352 10*3/uL (ref 150–400)
RBC: 4.83 MIL/uL (ref 4.22–5.81)
RDW: 14.1 % (ref 11.5–15.5)
WBC: 10.2 10*3/uL (ref 4.0–10.5)
nRBC: 0 % (ref 0.0–0.2)

## 2020-05-17 NOTE — ED Provider Notes (Signed)
Shared service with APP.  I have personally seen and examined the patient, providing direct face to face care.  Physical exam findings and plan include  Patient presents with minimal suprapubic catheter output the past 4 days, change in sent in urine. Patient has mild discomfort suprapubic on exam, normal heart rate, normal oxygenation. Bedside ultrasound showed small amount of urine in the bladder, balloon inflated, we were able to flush small amount into the bladder however patient had significant discomfort. Patient is required IR assistance for exchange Due to small catheter size and complicated history. Urology consulted for further advice. Updated patient on plan of care.  Ultrasound ED Renal  Date/Time: 05/17/2020 9:52 AM Performed by: Blane Ohara, MD Authorized by: Blane Ohara, MD   Procedure details:    Indications: urinary retention     Technique:  BladderImages: archivedStudy Limitations: body habitus Bladder findings:    Bladder:  Visualized   Free pelvic fluid: not identified     Volume:  Small     No diagnosis found.     Blane Ohara, MD 05/17/20 [phone]

## 2020-05-17 NOTE — ED Notes (Signed)
[facility] left the belongings for the patient. I have called them and they plan to come back to retrieve.

## 2020-05-17 NOTE — ED Notes (Signed)
Refused vitals 
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
## 2020-05-17 NOTE — ED Notes (Signed)
Pt arrives from waiting room with chief complaint of his suprapubic cathter not draining for 3-4 days. However patients brief and clothes are wet and have a foul smell of urine. He is not sure exactly when this was changed last. He denies any abdominal pain. He is upset due to long wait times and refusing to get into a gown. He did let me remove his wet bottoms and clean him from the waist down with non rinse soap.

## 2020-05-17 NOTE — Discharge Instructions (Addendum)
You were seen in the emergency department for suprapubic catheter dysfunction  Ultrasound showed that the balloon/Foley is in the bladder.  We were unable to irrigate it.  We spoke with Dr. Laverle Patter with urology who recommends he follow-up in clinic with Dr. Alvester Morin.  Your catheter may need to be exchanged by interventional radiology or in the operating room.   At this time you are not retaining urine and are leaking urine through your urethra/penis.  Wear incontinence pads.  Return to the ED immediately if you have severe constant lower abdominal pain, distention or fullness, inability to void urine, fever

## 2020-05-17 NOTE — ED Notes (Signed)
Unable to obtain a complete set of vitals, pt is refusing to let me take his temperature. Pt has been instructed to please not curse at staff as he continues to use agressive language toward this RN and emt. Pt apologized for using foul language.

## 2020-05-17 NOTE — ED Notes (Signed)
Report called to Writer at [employer] [facility] [place].

## 2021-05-09 ENCOUNTER — Emergency Department (HOSPITAL_COMMUNITY)
Admission: EM | Admit: 2021-05-09 | Discharge: 2021-05-09 | Disposition: A | Discharge status: Skilled Nursing Facility | Payer: Medicaid Other | Attending: Emergency Medicine | Admitting: Emergency Medicine

## 2021-05-09 ENCOUNTER — Emergency Department (HOSPITAL_COMMUNITY): Payer: Medicaid Other

## 2021-05-09 DIAGNOSIS — Z7982 Long term (current) use of aspirin: Secondary | ICD-10-CM | POA: Diagnosis not present

## 2021-05-09 DIAGNOSIS — R600 Localized edema: Secondary | ICD-10-CM | POA: Insufficient documentation

## 2021-05-09 DIAGNOSIS — Z79899 Other long term (current) drug therapy: Secondary | ICD-10-CM | POA: Insufficient documentation

## 2021-05-09 DIAGNOSIS — Z87891 Personal history of nicotine dependence: Secondary | ICD-10-CM | POA: Diagnosis not present

## 2021-05-09 DIAGNOSIS — I10 Essential (primary) hypertension: Secondary | ICD-10-CM | POA: Diagnosis not present

## 2021-05-09 DIAGNOSIS — N3090 Cystitis, unspecified without hematuria: Secondary | ICD-10-CM | POA: Insufficient documentation

## 2021-05-09 DIAGNOSIS — R791 Abnormal coagulation profile: Secondary | ICD-10-CM | POA: Diagnosis not present

## 2021-05-09 DIAGNOSIS — N3000 Acute cystitis without hematuria: Secondary | ICD-10-CM

## 2021-05-09 DIAGNOSIS — R4182 Altered mental status, unspecified: Secondary | ICD-10-CM | POA: Diagnosis present

## 2021-05-09 LAB — COMPREHENSIVE METABOLIC PANEL
ALT: 14 U/L (ref 0–44)
AST: 20 U/L (ref 15–41)
Albumin: 3.3 g/dL — ABNORMAL LOW (ref 3.5–5.0)
Alkaline Phosphatase: 119 U/L (ref 38–126)
Anion gap: 11 (ref 5–15)
BUN: 12 mg/dL (ref 6–20)
CO2: 27 mmol/L (ref 22–32)
Calcium: 8.7 mg/dL — ABNORMAL LOW (ref 8.9–10.3)
Chloride: 97 mmol/L — ABNORMAL LOW (ref 98–111)
Creatinine, Ser: 1.6 mg/dL — ABNORMAL HIGH (ref 0.61–1.24)
GFR, Estimated: 51 mL/min — ABNORMAL LOW (ref >60)
Glucose, Bld: 123 mg/dL — ABNORMAL HIGH (ref 70–99)
Potassium: 4.1 mmol/L (ref 3.5–5.1)
Sodium: 135 mmol/L (ref 135–145)
Total Bilirubin: 0.9 mg/dL (ref 0.3–1.2)
Total Protein: 7.6 g/dL (ref 6.5–8.1)

## 2021-05-09 LAB — CBC WITH DIFFERENTIAL/PLATELET
Abs Immature Granulocytes: 0.08 10*3/uL — ABNORMAL HIGH (ref 0.00–0.07)
Basophils Absolute: 0.1 10*3/uL (ref 0.0–0.1)
Basophils Relative: 0 %
Eosinophils Absolute: 0 10*3/uL (ref 0.0–0.5)
Eosinophils Relative: 0 %
HCT: 43.8 % (ref 39.0–52.0)
Hemoglobin: 14.5 g/dL (ref 13.0–17.0)
Immature Granulocytes: 0 %
Lymphocytes Relative: 3 %
Lymphs Abs: 0.7 10*3/uL (ref 0.7–4.0)
MCH: 28.6 pg (ref 26.0–34.0)
MCHC: 33.1 g/dL (ref 30.0–36.0)
MCV: 86.4 fL (ref 80.0–100.0)
Monocytes Absolute: 1 10*3/uL (ref 0.1–1.0)
Monocytes Relative: 5 %
Neutro Abs: 17.5 10*3/uL — ABNORMAL HIGH (ref 1.7–7.7)
Neutrophils Relative %: 92 %
Platelets: 429 10*3/uL — ABNORMAL HIGH (ref 150–400)
RBC: 5.07 MIL/uL (ref 4.22–5.81)
RDW: 14.2 % (ref 11.5–15.5)
WBC: 19.3 10*3/uL — ABNORMAL HIGH (ref 4.0–10.5)
nRBC: 0 % (ref 0.0–0.2)

## 2021-05-09 LAB — URINALYSIS, ROUTINE W REFLEX MICROSCOPIC
Bilirubin Urine: NEGATIVE
Glucose, UA: NEGATIVE mg/dL
Hgb urine dipstick: NEGATIVE
Ketones, ur: NEGATIVE mg/dL
Nitrite: NEGATIVE
Protein, ur: 100 mg/dL — AB
Specific Gravity, Urine: 1.013 (ref 1.005–1.030)
WBC, UA: >50 WBC/hpf — ABNORMAL HIGH (ref 0–5)
pH: 9 — ABNORMAL HIGH (ref 5.0–8.0)

## 2021-05-09 LAB — LACTIC ACID, PLASMA: Lactic Acid, Venous: 1.7 mmol/L (ref 0.5–1.9)

## 2021-05-09 LAB — APTT: aPTT: 36 seconds (ref 24–36)

## 2021-05-09 LAB — PROTIME-INR
INR: 1.2 (ref 0.8–1.2)
Prothrombin Time: 14.8 seconds (ref 11.4–15.2)

## 2021-05-09 MED ORDER — METRONIDAZOLE 500 MG/100ML IV SOLN
500.0000 mg | Freq: Once | INTRAVENOUS | Status: AC
  Administered 2021-05-09: 09:00:00 500 mg via INTRAVENOUS
  Filled 2021-05-09: qty 100

## 2021-05-09 MED ORDER — LACTATED RINGERS IV BOLUS (SEPSIS)
200.0000 mL | Freq: Once | INTRAVENOUS | Status: AC
  Administered 2021-05-09: 09:00:00 200 mL via INTRAVENOUS

## 2021-05-09 MED ORDER — SODIUM CHLORIDE 0.9 % IV SOLN
2.0000 g | Freq: Once | INTRAVENOUS | Status: AC
  Administered 2021-05-09: 09:00:00 2 g via INTRAVENOUS
  Filled 2021-05-09: qty 2

## 2021-05-09 MED ORDER — LACTATED RINGERS IV BOLUS (SEPSIS)
1000.0000 mL | Freq: Once | INTRAVENOUS | Status: AC
  Administered 2021-05-09: 09:00:00 1000 mL via INTRAVENOUS

## 2021-05-09 MED ORDER — VANCOMYCIN HCL 2000 MG/400ML IV SOLN
2000.0000 mg | Freq: Once | INTRAVENOUS | Status: AC
  Administered 2021-05-09: 09:00:00 2000 mg via INTRAVENOUS
  Filled 2021-05-09: qty 400

## 2021-05-09 MED ORDER — METHYLPREDNISOLONE SODIUM SUCC 125 MG IJ SOLR
125.0000 mg | Freq: Once | INTRAMUSCULAR | Status: AC
  Administered 2021-05-09: 09:00:00 125 mg via INTRAVENOUS
  Filled 2021-05-09: qty 2

## 2021-05-09 MED ORDER — CEFPODOXIME PROXETIL 200 MG PO TABS: 200.0000 mg | ORAL_TABLET | Freq: Two times a day (BID) | ORAL | 0 refills | Status: AC

## 2021-05-09 MED ORDER — LACTATED RINGERS IV SOLN: INTRAVENOUS | Status: DC

## 2021-05-09 MED ORDER — ALBUTEROL SULFATE HFA 108 (90 BASE) MCG/ACT IN AERS
4.0000 | INHALATION_SPRAY | Freq: Once | RESPIRATORY_TRACT | Status: DC
  Filled 2021-05-09: qty 6.7

## 2021-05-09 MED ORDER — LACTATED RINGERS IV BOLUS (SEPSIS)
1000.0000 mL | Freq: Once | INTRAVENOUS | Status: AC
Start: 2021-05-09 — End: 2021-05-09
  Administered 2021-05-09: 09:00:00 1000 mL via INTRAVENOUS
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
## 2021-05-09 NOTE — Progress Notes (Signed)
Patient refusing ABG. RN is aware.

## 2021-05-09 NOTE — ED Notes (Signed)
[facility] called to transport patient  

## 2021-05-09 NOTE — ED Notes (Signed)
Attempted report to [facility] x2.

## 2021-05-09 NOTE — Sepsis Progress Note (Signed)
eLink monitoring code sepsis.  

## 2021-05-09 NOTE — Discharge Instructions (Signed)
It was a pleasure taking care of you today. As discussed, it was recommended you get admitted to the hospital for IV antibiotics; however you refused and would prefer to be discharged. Please have your catheter changed within the next week. I am sending you home with antibiotics. Take as prescribed and finish all antibiotics. Follow-up with PCP within the next few days. Return to the ER for new or worsening symptoms.

## 2021-05-09 NOTE — ED Notes (Signed)
This RN walked in to room to attempt to get blood and an IV started. Pt starts yelling and refusing all care. MD made aware.

## 2021-05-09 NOTE — ED Triage Notes (Signed)
Pt bib [facility] from [facility]. Original call out was for sob/cough. Pt found to  be altered and combative with ems- a&o2. Pt a&ox4 at baseline per facility. Purple colored urine in foley bag on arrival. Pt denies pain.

## 2021-05-10 ENCOUNTER — Telehealth (HOSPITAL_BASED_OUTPATIENT_CLINIC_OR_DEPARTMENT_OTHER): Payer: Self-pay | Admitting: *Deleted

## 2021-05-10 ENCOUNTER — Telehealth (HOSPITAL_BASED_OUTPATIENT_CLINIC_OR_DEPARTMENT_OTHER): Payer: Self-pay | Admitting: Emergency Medicine

## 2021-05-10 LAB — URINE CULTURE

## 2021-05-10 LAB — BLOOD CULTURE ID PANEL (REFLEXED) - BCID2

## 2021-05-10 NOTE — Telephone Encounter (Signed)
Received positive blood culture results from [facility] micro lab. Consulted with Dr. Pilar Plate who advised pt should return to ED for further treatment. Will have day shift charge nurse follow up with patient.

## 2021-05-11 ENCOUNTER — Encounter: Payer: Self-pay | Admitting: Family Medicine

## 2021-05-11 LAB — CULTURE, BLOOD (ROUTINE X 2)
Special Requests: ADEQUATE
Special Requests: ADEQUATE

## 2021-05-12 NOTE — Progress Notes (Signed)
ED Antimicrobial Stewardship Positive Culture Follow Up   [name] is an 55 y.o. male who presented to [facility] on 05/09/2021 with a chief complaint of  Chief Complaint  Patient presents with   Altered Mental Status    Recent Results (from the past 720 hour(s))  Urine Culture     Status: Abnormal   Collection Time: 05/09/21  7:40 AM   Specimen: In/Out Cath Urine  Result Value Ref Range Status   Specimen Description IN/OUT CATH URINE  Final   Special Requests   Final    NONE Performed at [facility], [address] [place], [place] [address]    Culture MULTIPLE SPECIES PRESENT, SUGGEST RECOLLECTION (A)  Final   Report Status 05/10/2021 FINAL  Final  Blood Culture (routine x 2)     Status: Abnormal   Collection Time: 05/09/21  8:24 AM   Specimen: Site Not Specified; Blood  Result Value Ref Range Status   Specimen Description SITE NOT SPECIFIED  Final   Special Requests   Final    BOTTLES DRAWN AEROBIC AND ANAEROBIC Blood Culture adequate volume   Culture  Setup Time   Final    GRAM NEGATIVE RODS ANAEROBIC BOTTLE ONLY CRITICAL VALUE NOTED.  VALUE IS CONSISTENT WITH PREVIOUSLY REPORTED AND CALLED VALUE.    Culture (A)  Final    PROTEUS MIRABILIS SUSCEPTIBILITIES PERFORMED ON PREVIOUS CULTURE WITHIN THE LAST 5 DAYS. Performed at [facility], [address] [place], [place] [address]    Report Status 05/11/2021 FINAL  Final  Blood Culture (routine x 2)     Status: Abnormal   Collection Time: 05/09/21  8:45 AM   Specimen: Site Not Specified; Blood  Result Value Ref Range Status   Specimen Description SITE NOT SPECIFIED  Final   Special Requests   Final    BOTTLES DRAWN AEROBIC AND ANAEROBIC Blood Culture adequate volume   Culture  Setup Time   Final    GRAM NEGATIVE RODS ANAEROBIC BOTTLE ONLY CRITICAL RESULT CALLED TO, READ BACK BY AND VERIFIED WITH: RN KELLY NEAL 05/10/21@2 :11 BY TW IN BOTH AEROBIC AND ANAEROBIC BOTTLES Performed at [facility], [address] [place], [place] [address]    Culture PROTEUS MIRABILIS (A)  Final   Report Status 05/11/2021 FINAL  Final   Organism ID, Bacteria PROTEUS MIRABILIS  Final      Susceptibility   Proteus mirabilis - MIC*    AMPICILLIN >=32 RESISTANT Resistant     CEFAZOLIN 8 SENSITIVE Sensitive     CEFEPIME <=0.12 SENSITIVE Sensitive     CEFTAZIDIME <=1 SENSITIVE Sensitive     CEFTRIAXONE <=0.25 SENSITIVE Sensitive     CIPROFLOXACIN >=4 RESISTANT Resistant     GENTAMICIN <=1 SENSITIVE Sensitive     IMIPENEM 2 SENSITIVE Sensitive     TRIMETH/SULFA >=320 RESISTANT Resistant     AMPICILLIN/SULBACTAM 8 SENSITIVE Sensitive     PIP/TAZO <=4 SENSITIVE Sensitive     * PROTEUS MIRABILIS  Blood Culture ID Panel (Reflexed)     Status: Abnormal   Collection Time: 05/09/21  8:45 AM  Result Value Ref Range Status   Enterococcus faecalis NOT DETECTED NOT DETECTED Final   Enterococcus Faecium NOT DETECTED NOT DETECTED Final   Listeria monocytogenes NOT DETECTED NOT DETECTED Final   Staphylococcus species NOT DETECTED NOT DETECTED Final   Staphylococcus aureus (BCID) NOT DETECTED NOT DETECTED Final   Staphylococcus epidermidis NOT DETECTED NOT DETECTED Final   Staphylococcus  lugdunensis NOT DETECTED NOT DETECTED Final   Streptococcus species NOT DETECTED NOT DETECTED Final   Streptococcus agalactiae NOT DETECTED NOT DETECTED Final   Streptococcus pneumoniae NOT DETECTED NOT DETECTED Final   Streptococcus pyogenes NOT DETECTED NOT DETECTED Final   A.calcoaceticus-baumannii NOT DETECTED NOT DETECTED Final   Bacteroides fragilis NOT DETECTED NOT DETECTED Final   Enterobacterales DETECTED (A) NOT DETECTED Final    Comment: Enterobacterales represent a large order of gram negative bacteria, not a single organism. CRITICAL RESULT CALLED TO, READ BACK BY AND VERIFIED WITH: RN KELLY NEAL  05/10/21@2 :11 BY TW    Enterobacter cloacae complex NOT DETECTED NOT DETECTED Final   Escherichia  coli NOT DETECTED NOT DETECTED Final   Klebsiella aerogenes NOT DETECTED NOT DETECTED Final   Klebsiella oxytoca NOT DETECTED NOT DETECTED Final   Klebsiella pneumoniae NOT DETECTED NOT DETECTED Final   Proteus species DETECTED (A) NOT DETECTED Final    Comment: CRITICAL RESULT CALLED TO, READ BACK BY AND VERIFIED WITH: RN KELLY NEAL 05/10/21@2 :11 BY TW    Salmonella species NOT DETECTED NOT DETECTED Final   Serratia marcescens NOT DETECTED NOT DETECTED Final   Haemophilus influenzae NOT DETECTED NOT DETECTED Final   Neisseria meningitidis NOT DETECTED NOT DETECTED Final   Pseudomonas aeruginosa NOT DETECTED NOT DETECTED Final   Stenotrophomonas maltophilia NOT DETECTED NOT DETECTED Final   Candida albicans NOT DETECTED NOT DETECTED Final   Candida auris NOT DETECTED NOT DETECTED Final   Candida glabrata NOT DETECTED NOT DETECTED Final   Candida krusei NOT DETECTED NOT DETECTED Final   Candida parapsilosis NOT DETECTED NOT DETECTED Final   Candida tropicalis NOT DETECTED NOT DETECTED Final   Cryptococcus neoformans/gattii NOT DETECTED NOT DETECTED Final   CTX-M ESBL NOT DETECTED NOT DETECTED Final   Carbapenem resistance IMP NOT DETECTED NOT DETECTED Final   Carbapenem resistance KPC NOT DETECTED NOT DETECTED Final   Carbapenem resistance NDM NOT DETECTED NOT DETECTED Final   Carbapenem resist OXA 48 LIKE NOT DETECTED NOT DETECTED Final   Carbapenem resistance VIM NOT DETECTED NOT DETECTED Final    Comment: Performed at [facility], [address], [place], [place] [address]    [x]  BCx positive for proteus mirablis x 2. Dr. has already recommended return for treatment (see note from RN Pilar Plate). Please, ensure patient has been notified and encourage return for treatment of gram negative bacteremia.  Waldo Laine, PharmD, BCPS 05/12/2021 9:53 AM ED Clinical Pharmacist -  [phone]

## 2021-05-13 ENCOUNTER — Telehealth: Payer: Self-pay | Admitting: Emergency Medicine

## 2021-05-13 NOTE — Telephone Encounter (Signed)
Post ED Visit - Positive Culture Follow-up: Unsuccessful Patient Follow-up  Culture assessed and recommendations reviewed by:  []  , Pharm.D. []  Enzo Bi, Pharm.D., BCPS AQ-ID []  , Pharm.D., BCPS []  Celedonio Miyamoto, Pharm.D., BCPS []  [place], Garvin Fila.D., BCPS, AAHIVP []  , Pharm.D., BCPS, AAHIVP [x]  Georgina Pillion, PharmD []  , PharmD, BCPS  Positive Blood culture  []  Patient discharged without antimicrobial prescription and treatment is now indicated []  Organism is resistant to prescribed ED discharge antimicrobial []  Patient with positive blood cultures   Unable to contact patient by phone, letter will be sent to address on file  Plan:  Follow-up with patient to ensure pt has been notified. Dr [place] had already recommended pt return to ED for treatment.  [address] [name] 05/13/2021, 11:01 AM

## 2022-12-30 ENCOUNTER — Ambulatory Visit (INDEPENDENT_AMBULATORY_CARE_PROVIDER_SITE_OTHER): Payer: Medicaid Other | Admitting: Podiatry

## 2022-12-30 DIAGNOSIS — I890 Lymphedema, not elsewhere classified: Secondary | ICD-10-CM

## 2022-12-30 DIAGNOSIS — I739 Peripheral vascular disease, unspecified: Secondary | ICD-10-CM | POA: Diagnosis not present

## 2022-12-30 DIAGNOSIS — B351 Tinea unguium: Secondary | ICD-10-CM
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
## 2023-04-01 ENCOUNTER — Encounter: Payer: Self-pay | Admitting: Podiatry

## 2023-04-01 ENCOUNTER — Ambulatory Visit (INDEPENDENT_AMBULATORY_CARE_PROVIDER_SITE_OTHER): Payer: Medicaid Other | Admitting: Podiatry

## 2023-04-01 DIAGNOSIS — B351 Tinea unguium: Secondary | ICD-10-CM

## 2023-04-01 DIAGNOSIS — M79675 Pain in left toe(s): Secondary | ICD-10-CM

## 2023-04-01 DIAGNOSIS — M79674 Pain in right toe(s): Secondary | ICD-10-CM | POA: Diagnosis not present
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
## 2023-09-18 ENCOUNTER — Emergency Department (HOSPITAL_COMMUNITY)
Admission: EM | Admit: 2023-09-18 | Discharge: 2023-09-18 | Disposition: A | Discharge status: Skilled Nursing Facility | Attending: Emergency Medicine | Admitting: Emergency Medicine

## 2023-09-18 ENCOUNTER — Other Ambulatory Visit: Payer: Self-pay

## 2023-09-18 DIAGNOSIS — Z5900 Homelessness unspecified: Secondary | ICD-10-CM | POA: Insufficient documentation

## 2023-09-18 DIAGNOSIS — Z7982 Long term (current) use of aspirin: Secondary | ICD-10-CM | POA: Insufficient documentation

## 2023-09-18 DIAGNOSIS — T83010A Breakdown (mechanical) of cystostomy catheter, initial encounter: Secondary | ICD-10-CM | POA: Insufficient documentation

## 2023-09-18 DIAGNOSIS — Y732 Prosthetic and other implants, materials and accessory gastroenterology and urology devices associated with adverse incidents: Secondary | ICD-10-CM | POA: Diagnosis not present

## 2023-09-18 DIAGNOSIS — I10 Essential (primary) hypertension: Secondary | ICD-10-CM | POA: Insufficient documentation

## 2023-09-18 DIAGNOSIS — Z79899 Other long term (current) drug therapy: Secondary | ICD-10-CM | POA: Diagnosis not present
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
## 2023-09-18 NOTE — ED Triage Notes (Signed)
 Pt from [facility] for displaced suprapubic catheter. Pt states he thinks the balloon deflated, causing the catheter to slip out. Pt denies any pain at this time.

## 2023-09-18 NOTE — Discharge Instructions (Signed)
 You have a catheter currently in place that does not have a balloon.  You need to keep it taped in.  Please follow up with your urologist within 1 week to have the correct catheter inserted.
